# Patient Record
Sex: Female | Born: 1960 | Race: White | Hispanic: No | Marital: Married | Smoking: Former smoker
Health system: Southern US, Community
[De-identification: ages and names within clinical notes are randomized; demographics above are authoritative.]

## PROBLEM LIST (undated history)

## (undated) DIAGNOSIS — J45909 Unspecified asthma, uncomplicated: Secondary | ICD-10-CM

## (undated) DIAGNOSIS — E119 Type 2 diabetes mellitus without complications: Secondary | ICD-10-CM

## (undated) DIAGNOSIS — I509 Heart failure, unspecified: Secondary | ICD-10-CM

## (undated) DIAGNOSIS — I10 Essential (primary) hypertension: Secondary | ICD-10-CM

## (undated) DIAGNOSIS — G4730 Sleep apnea, unspecified: Secondary | ICD-10-CM

## (undated) DIAGNOSIS — J449 Chronic obstructive pulmonary disease, unspecified: Secondary | ICD-10-CM

## (undated) HISTORY — PX: ABDOMINAL HYSTERECTOMY: SHX81

## (undated) HISTORY — PX: COLON SURGERY: SHX602

---

## 2006-05-24 ENCOUNTER — Emergency Department: Payer: Self-pay | Admitting: Emergency Medicine

## 2006-12-20 ENCOUNTER — Emergency Department: Payer: Self-pay | Admitting: Emergency Medicine

## 2006-12-23 ENCOUNTER — Inpatient Hospital Stay: Payer: Self-pay | Admitting: Gastroenterology

## 2007-01-13 ENCOUNTER — Ambulatory Visit: Payer: Self-pay | Admitting: Gastroenterology

## 2007-01-17 ENCOUNTER — Ambulatory Visit: Payer: Self-pay | Admitting: Gastroenterology

## 2007-01-26 ENCOUNTER — Ambulatory Visit: Payer: Self-pay | Admitting: Surgery

## 2007-01-27 ENCOUNTER — Inpatient Hospital Stay: Payer: Self-pay | Admitting: Surgery

## 2007-07-04 ENCOUNTER — Ambulatory Visit: Payer: Self-pay | Admitting: Gastroenterology

## 2007-07-12 ENCOUNTER — Ambulatory Visit: Payer: Self-pay | Admitting: Gastroenterology

## 2007-07-15 ENCOUNTER — Emergency Department: Payer: Self-pay | Admitting: Emergency Medicine

## 2007-09-01 ENCOUNTER — Ambulatory Visit: Payer: Self-pay | Admitting: Family Medicine

## 2008-01-03 ENCOUNTER — Ambulatory Visit: Payer: Self-pay | Admitting: Surgery

## 2008-08-06 ENCOUNTER — Emergency Department: Payer: Self-pay | Admitting: Emergency Medicine

## 2009-01-30 ENCOUNTER — Emergency Department: Payer: Self-pay | Admitting: Unknown Physician Specialty

## 2010-07-06 ENCOUNTER — Emergency Department: Payer: Self-pay | Admitting: Emergency Medicine

## 2011-05-01 ENCOUNTER — Inpatient Hospital Stay: Payer: Self-pay | Admitting: Internal Medicine

## 2011-05-06 DIAGNOSIS — I059 Rheumatic mitral valve disease, unspecified: Secondary | ICD-10-CM

## 2012-01-14 ENCOUNTER — Ambulatory Visit: Payer: Self-pay | Admitting: Family Medicine

## 2012-02-01 ENCOUNTER — Ambulatory Visit: Payer: Self-pay | Admitting: Family Medicine

## 2012-12-01 ENCOUNTER — Ambulatory Visit: Payer: Self-pay | Admitting: Internal Medicine

## 2013-03-13 ENCOUNTER — Ambulatory Visit: Payer: Self-pay | Admitting: Internal Medicine

## 2013-06-10 DIAGNOSIS — J441 Chronic obstructive pulmonary disease with (acute) exacerbation: Secondary | ICD-10-CM | POA: Diagnosis not present

## 2013-07-05 DIAGNOSIS — J441 Chronic obstructive pulmonary disease with (acute) exacerbation: Secondary | ICD-10-CM | POA: Diagnosis not present

## 2013-07-23 ENCOUNTER — Inpatient Hospital Stay: Payer: Self-pay | Admitting: Internal Medicine

## 2013-07-23 DIAGNOSIS — Z6831 Body mass index (BMI) 31.0-31.9, adult: Secondary | ICD-10-CM | POA: Diagnosis not present

## 2013-07-23 DIAGNOSIS — Z23 Encounter for immunization: Secondary | ICD-10-CM | POA: Diagnosis not present

## 2013-07-23 DIAGNOSIS — J440 Chronic obstructive pulmonary disease with acute lower respiratory infection: Secondary | ICD-10-CM | POA: Diagnosis not present

## 2013-07-23 DIAGNOSIS — R6510 Systemic inflammatory response syndrome (SIRS) of non-infectious origin without acute organ dysfunction: Secondary | ICD-10-CM | POA: Diagnosis not present

## 2013-07-23 DIAGNOSIS — I10 Essential (primary) hypertension: Secondary | ICD-10-CM | POA: Diagnosis not present

## 2013-07-23 DIAGNOSIS — J9600 Acute respiratory failure, unspecified whether with hypoxia or hypercapnia: Secondary | ICD-10-CM | POA: Diagnosis not present

## 2013-07-23 DIAGNOSIS — J189 Pneumonia, unspecified organism: Secondary | ICD-10-CM | POA: Diagnosis not present

## 2013-07-23 DIAGNOSIS — Z87891 Personal history of nicotine dependence: Secondary | ICD-10-CM | POA: Diagnosis not present

## 2013-07-23 DIAGNOSIS — J9819 Other pulmonary collapse: Secondary | ICD-10-CM | POA: Diagnosis not present

## 2013-07-23 DIAGNOSIS — J449 Chronic obstructive pulmonary disease, unspecified: Secondary | ICD-10-CM | POA: Diagnosis not present

## 2013-07-23 DIAGNOSIS — R7309 Other abnormal glucose: Secondary | ICD-10-CM | POA: Diagnosis present

## 2013-07-23 DIAGNOSIS — J441 Chronic obstructive pulmonary disease with (acute) exacerbation: Secondary | ICD-10-CM | POA: Diagnosis not present

## 2013-07-23 DIAGNOSIS — R059 Cough, unspecified: Secondary | ICD-10-CM | POA: Diagnosis not present

## 2013-07-23 DIAGNOSIS — R05 Cough: Secondary | ICD-10-CM | POA: Diagnosis not present

## 2013-07-23 DIAGNOSIS — E669 Obesity, unspecified: Secondary | ICD-10-CM | POA: Diagnosis present

## 2013-07-23 DIAGNOSIS — R0789 Other chest pain: Secondary | ICD-10-CM | POA: Diagnosis not present

## 2013-07-23 DIAGNOSIS — A419 Sepsis, unspecified organism: Secondary | ICD-10-CM | POA: Diagnosis not present

## 2013-07-23 DIAGNOSIS — R0602 Shortness of breath: Secondary | ICD-10-CM | POA: Diagnosis not present

## 2013-07-23 DIAGNOSIS — J158 Pneumonia due to other specified bacteria: Secondary | ICD-10-CM | POA: Diagnosis not present

## 2013-07-23 LAB — BASIC METABOLIC PANEL
Anion Gap: 8 (ref 7–16)
BUN: 10 mg/dL (ref 7–18)
CREATININE: 0.9 mg/dL (ref 0.60–1.30)
Calcium, Total: 9.5 mg/dL (ref 8.5–10.1)
Chloride: 101 mmol/L (ref 98–107)
Co2: 24 mmol/L (ref 21–32)
EGFR (African American): >60
Glucose: 130 mg/dL — ABNORMAL HIGH (ref 65–99)
Osmolality: 267 (ref 275–301)
Potassium: 3.9 mmol/L (ref 3.5–5.1)
Sodium: 133 mmol/L — ABNORMAL LOW (ref 136–145)

## 2013-07-23 LAB — CBC
HCT: 43.1 % (ref 35.0–47.0)
HGB: 14.4 g/dL (ref 12.0–16.0)
MCH: 29.6 pg (ref 26.0–34.0)
MCHC: 33.4 g/dL (ref 32.0–36.0)
MCV: 89 fL (ref 80–100)
Platelet: 280 10*3/uL (ref 150–440)
RBC: 4.87 10*6/uL (ref 3.80–5.20)
RDW: 13.2 % (ref 11.5–14.5)
WBC: 17 10*3/uL — ABNORMAL HIGH (ref 3.6–11.0)

## 2013-07-23 LAB — TROPONIN I: Troponin-I: <0.02 ng/mL

## 2013-07-24 LAB — CBC WITH DIFFERENTIAL/PLATELET
Basophil #: 0.1 10*3/uL (ref 0.0–0.1)
Basophil %: 0.5 %
EOS ABS: 0 10*3/uL (ref 0.0–0.7)
Eosinophil %: 0 %
HCT: 38.7 % (ref 35.0–47.0)
HGB: 13.2 g/dL (ref 12.0–16.0)
Lymphocyte #: 1.1 10*3/uL (ref 1.0–3.6)
Lymphocyte %: 8.4 %
MCH: 30.6 pg (ref 26.0–34.0)
MCHC: 34 g/dL (ref 32.0–36.0)
MCV: 90 fL (ref 80–100)
MONO ABS: 0.2 x10 3/mm (ref 0.2–0.9)
MONOS PCT: 1.6 %
NEUTROS ABS: 11.5 10*3/uL — AB (ref 1.4–6.5)
Neutrophil %: 89.5 %
PLATELETS: 258 10*3/uL (ref 150–440)
RBC: 4.31 10*6/uL (ref 3.80–5.20)
RDW: 13.1 % (ref 11.5–14.5)
WBC: 12.9 10*3/uL — ABNORMAL HIGH (ref 3.6–11.0)

## 2013-07-24 LAB — BASIC METABOLIC PANEL
Anion Gap: 7 (ref 7–16)
BUN: 10 mg/dL (ref 7–18)
Calcium, Total: 9 mg/dL (ref 8.5–10.1)
Chloride: 105 mmol/L (ref 98–107)
Co2: 24 mmol/L (ref 21–32)
Creatinine: 1 mg/dL (ref 0.60–1.30)
EGFR (Non-African Amer.): >60
Glucose: 151 mg/dL — ABNORMAL HIGH (ref 65–99)
Osmolality: 274 (ref 275–301)
POTASSIUM: 4.4 mmol/L (ref 3.5–5.1)
SODIUM: 136 mmol/L (ref 136–145)

## 2013-07-25 LAB — CREATININE, SERUM
CREATININE: 1 mg/dL (ref 0.60–1.30)
EGFR (African American): >60
EGFR (Non-African Amer.): >60

## 2013-07-25 LAB — VANCOMYCIN, TROUGH: Vancomycin, Trough: 13 ug/mL (ref 10–20)

## 2013-07-26 LAB — EXPECTORATED SPUTUM ASSESSMENT W GRAM STAIN, RFLX TO RESP C

## 2013-07-28 LAB — CULTURE, BLOOD (SINGLE)

## 2013-07-28 LAB — PLATELET COUNT: Platelet: 288 10*3/uL (ref 150–440)

## 2013-08-04 DIAGNOSIS — J441 Chronic obstructive pulmonary disease with (acute) exacerbation: Secondary | ICD-10-CM | POA: Diagnosis not present

## 2013-08-04 DIAGNOSIS — R609 Edema, unspecified: Secondary | ICD-10-CM | POA: Diagnosis not present

## 2013-08-04 DIAGNOSIS — B370 Candidal stomatitis: Secondary | ICD-10-CM | POA: Diagnosis not present

## 2013-08-09 DIAGNOSIS — B370 Candidal stomatitis: Secondary | ICD-10-CM | POA: Diagnosis not present

## 2013-08-09 DIAGNOSIS — J441 Chronic obstructive pulmonary disease with (acute) exacerbation: Secondary | ICD-10-CM | POA: Diagnosis not present

## 2013-08-16 ENCOUNTER — Ambulatory Visit: Payer: Self-pay | Admitting: Internal Medicine

## 2013-08-16 DIAGNOSIS — J441 Chronic obstructive pulmonary disease with (acute) exacerbation: Secondary | ICD-10-CM | POA: Diagnosis not present

## 2013-08-16 DIAGNOSIS — J189 Pneumonia, unspecified organism: Secondary | ICD-10-CM | POA: Diagnosis not present

## 2013-08-16 DIAGNOSIS — J168 Pneumonia due to other specified infectious organisms: Secondary | ICD-10-CM | POA: Diagnosis not present

## 2013-08-16 DIAGNOSIS — R0602 Shortness of breath: Secondary | ICD-10-CM | POA: Diagnosis not present

## 2013-08-16 DIAGNOSIS — R062 Wheezing: Secondary | ICD-10-CM | POA: Diagnosis not present

## 2013-08-25 DIAGNOSIS — J168 Pneumonia due to other specified infectious organisms: Secondary | ICD-10-CM | POA: Diagnosis not present

## 2013-08-25 DIAGNOSIS — M545 Low back pain, unspecified: Secondary | ICD-10-CM | POA: Diagnosis not present

## 2013-08-25 DIAGNOSIS — R0602 Shortness of breath: Secondary | ICD-10-CM | POA: Diagnosis not present

## 2013-08-25 DIAGNOSIS — J449 Chronic obstructive pulmonary disease, unspecified: Secondary | ICD-10-CM | POA: Diagnosis not present

## 2013-08-29 DIAGNOSIS — M549 Dorsalgia, unspecified: Secondary | ICD-10-CM | POA: Diagnosis not present

## 2013-08-31 DIAGNOSIS — M62838 Other muscle spasm: Secondary | ICD-10-CM | POA: Diagnosis not present

## 2013-09-01 DIAGNOSIS — M549 Dorsalgia, unspecified: Secondary | ICD-10-CM | POA: Diagnosis not present

## 2013-09-05 DIAGNOSIS — M549 Dorsalgia, unspecified: Secondary | ICD-10-CM | POA: Diagnosis not present

## 2013-09-06 DIAGNOSIS — M549 Dorsalgia, unspecified: Secondary | ICD-10-CM | POA: Diagnosis not present

## 2013-09-19 DIAGNOSIS — J441 Chronic obstructive pulmonary disease with (acute) exacerbation: Secondary | ICD-10-CM | POA: Diagnosis not present

## 2013-09-19 DIAGNOSIS — J449 Chronic obstructive pulmonary disease, unspecified: Secondary | ICD-10-CM | POA: Diagnosis not present

## 2013-09-19 DIAGNOSIS — G4710 Hypersomnia, unspecified: Secondary | ICD-10-CM | POA: Diagnosis not present

## 2013-09-19 DIAGNOSIS — G4720 Circadian rhythm sleep disorder, unspecified type: Secondary | ICD-10-CM | POA: Diagnosis not present

## 2013-09-19 DIAGNOSIS — G4730 Sleep apnea, unspecified: Secondary | ICD-10-CM | POA: Diagnosis not present

## 2013-10-20 DIAGNOSIS — J019 Acute sinusitis, unspecified: Secondary | ICD-10-CM | POA: Diagnosis not present

## 2013-10-20 DIAGNOSIS — J440 Chronic obstructive pulmonary disease with acute lower respiratory infection: Secondary | ICD-10-CM | POA: Diagnosis not present

## 2013-12-10 ENCOUNTER — Emergency Department: Payer: Self-pay | Admitting: Emergency Medicine

## 2013-12-10 DIAGNOSIS — R52 Pain, unspecified: Secondary | ICD-10-CM | POA: Diagnosis not present

## 2013-12-10 DIAGNOSIS — K7689 Other specified diseases of liver: Secondary | ICD-10-CM | POA: Diagnosis not present

## 2013-12-10 DIAGNOSIS — Z9079 Acquired absence of other genital organ(s): Secondary | ICD-10-CM | POA: Diagnosis not present

## 2013-12-10 DIAGNOSIS — D3500 Benign neoplasm of unspecified adrenal gland: Secondary | ICD-10-CM | POA: Diagnosis not present

## 2013-12-10 DIAGNOSIS — Z79899 Other long term (current) drug therapy: Secondary | ICD-10-CM | POA: Diagnosis not present

## 2013-12-10 DIAGNOSIS — J449 Chronic obstructive pulmonary disease, unspecified: Secondary | ICD-10-CM | POA: Diagnosis not present

## 2013-12-10 DIAGNOSIS — R109 Unspecified abdominal pain: Secondary | ICD-10-CM | POA: Diagnosis not present

## 2013-12-10 LAB — URINALYSIS, COMPLETE
Bilirubin,UR: NEGATIVE
Glucose,UR: NEGATIVE mg/dL (ref 0–75)
KETONE: NEGATIVE
Leukocyte Esterase: NEGATIVE
Nitrite: NEGATIVE
PROTEIN: NEGATIVE
Ph: 5 (ref 4.5–8.0)
SPECIFIC GRAVITY: 1.011 (ref 1.003–1.030)

## 2013-12-10 LAB — CBC WITH DIFFERENTIAL/PLATELET
BASOS ABS: 0.1 10*3/uL (ref 0.0–0.1)
Basophil %: 1.2 %
Eosinophil #: 0.5 10*3/uL (ref 0.0–0.7)
Eosinophil %: 3.9 %
HCT: 45.3 % (ref 35.0–47.0)
HGB: 15.2 g/dL (ref 12.0–16.0)
Lymphocyte #: 4.1 10*3/uL — ABNORMAL HIGH (ref 1.0–3.6)
Lymphocyte %: 33.3 %
MCH: 29.7 pg (ref 26.0–34.0)
MCHC: 33.5 g/dL (ref 32.0–36.0)
MCV: 89 fL (ref 80–100)
MONO ABS: 0.9 x10 3/mm (ref 0.2–0.9)
Monocyte %: 7.4 %
NEUTROS ABS: 6.6 10*3/uL — AB (ref 1.4–6.5)
Neutrophil %: 54.2 %
PLATELETS: 307 10*3/uL (ref 150–440)
RBC: 5.11 10*6/uL (ref 3.80–5.20)
RDW: 13.1 % (ref 11.5–14.5)
WBC: 12.3 10*3/uL — ABNORMAL HIGH (ref 3.6–11.0)

## 2013-12-10 LAB — COMPREHENSIVE METABOLIC PANEL
ALBUMIN: 4 g/dL (ref 3.4–5.0)
AST: 24 U/L (ref 15–37)
Alkaline Phosphatase: 74 U/L
Anion Gap: 7 (ref 7–16)
BUN: 14 mg/dL (ref 7–18)
Bilirubin,Total: 0.3 mg/dL (ref 0.2–1.0)
CO2: 28 mmol/L (ref 21–32)
CREATININE: 0.86 mg/dL (ref 0.60–1.30)
Calcium, Total: 9.6 mg/dL (ref 8.5–10.1)
Chloride: 102 mmol/L (ref 98–107)
EGFR (African American): >60
EGFR (Non-African Amer.): >60
Glucose: 96 mg/dL (ref 65–99)
OSMOLALITY: 274 (ref 275–301)
POTASSIUM: 4.3 mmol/L (ref 3.5–5.1)
SGPT (ALT): 28 U/L (ref 12–78)
Sodium: 137 mmol/L (ref 136–145)
Total Protein: 8.1 g/dL (ref 6.4–8.2)

## 2013-12-12 DIAGNOSIS — K5732 Diverticulitis of large intestine without perforation or abscess without bleeding: Secondary | ICD-10-CM | POA: Diagnosis not present

## 2014-01-02 DIAGNOSIS — R0602 Shortness of breath: Secondary | ICD-10-CM | POA: Diagnosis not present

## 2014-01-02 DIAGNOSIS — R05 Cough: Secondary | ICD-10-CM | POA: Diagnosis not present

## 2014-01-02 DIAGNOSIS — J441 Chronic obstructive pulmonary disease with (acute) exacerbation: Secondary | ICD-10-CM | POA: Diagnosis not present

## 2014-01-02 DIAGNOSIS — R059 Cough, unspecified: Secondary | ICD-10-CM | POA: Diagnosis not present

## 2014-01-04 DIAGNOSIS — R109 Unspecified abdominal pain: Secondary | ICD-10-CM | POA: Insufficient documentation

## 2014-01-04 DIAGNOSIS — R1032 Left lower quadrant pain: Secondary | ICD-10-CM | POA: Diagnosis not present

## 2014-01-04 DIAGNOSIS — G8929 Other chronic pain: Secondary | ICD-10-CM | POA: Diagnosis not present

## 2014-01-04 DIAGNOSIS — K3189 Other diseases of stomach and duodenum: Secondary | ICD-10-CM | POA: Diagnosis not present

## 2014-01-04 DIAGNOSIS — R1013 Epigastric pain: Secondary | ICD-10-CM | POA: Diagnosis not present

## 2014-01-04 DIAGNOSIS — R1012 Left upper quadrant pain: Secondary | ICD-10-CM | POA: Diagnosis not present

## 2014-02-05 DIAGNOSIS — R0602 Shortness of breath: Secondary | ICD-10-CM | POA: Diagnosis not present

## 2014-02-05 DIAGNOSIS — J441 Chronic obstructive pulmonary disease with (acute) exacerbation: Secondary | ICD-10-CM | POA: Diagnosis not present

## 2014-02-06 ENCOUNTER — Ambulatory Visit: Payer: Self-pay | Admitting: Internal Medicine

## 2014-02-06 DIAGNOSIS — R059 Cough, unspecified: Secondary | ICD-10-CM | POA: Diagnosis not present

## 2014-02-06 DIAGNOSIS — J449 Chronic obstructive pulmonary disease, unspecified: Secondary | ICD-10-CM | POA: Diagnosis not present

## 2014-02-06 DIAGNOSIS — R0602 Shortness of breath: Secondary | ICD-10-CM | POA: Diagnosis not present

## 2014-02-08 DIAGNOSIS — R0602 Shortness of breath: Secondary | ICD-10-CM | POA: Diagnosis not present

## 2014-02-08 DIAGNOSIS — J440 Chronic obstructive pulmonary disease with acute lower respiratory infection: Secondary | ICD-10-CM | POA: Diagnosis not present

## 2014-04-30 ENCOUNTER — Ambulatory Visit: Payer: Self-pay | Admitting: Internal Medicine

## 2014-04-30 DIAGNOSIS — J9611 Chronic respiratory failure with hypoxia: Secondary | ICD-10-CM | POA: Diagnosis not present

## 2014-04-30 DIAGNOSIS — J9809 Other diseases of bronchus, not elsewhere classified: Secondary | ICD-10-CM | POA: Diagnosis not present

## 2014-04-30 DIAGNOSIS — R0602 Shortness of breath: Secondary | ICD-10-CM | POA: Diagnosis not present

## 2014-04-30 DIAGNOSIS — R05 Cough: Secondary | ICD-10-CM | POA: Diagnosis not present

## 2014-04-30 DIAGNOSIS — J441 Chronic obstructive pulmonary disease with (acute) exacerbation: Secondary | ICD-10-CM | POA: Diagnosis not present

## 2014-05-08 DIAGNOSIS — J9611 Chronic respiratory failure with hypoxia: Secondary | ICD-10-CM | POA: Diagnosis not present

## 2014-05-08 DIAGNOSIS — J441 Chronic obstructive pulmonary disease with (acute) exacerbation: Secondary | ICD-10-CM | POA: Diagnosis not present

## 2014-05-08 DIAGNOSIS — J440 Chronic obstructive pulmonary disease with acute lower respiratory infection: Secondary | ICD-10-CM | POA: Diagnosis not present

## 2014-05-08 DIAGNOSIS — G4733 Obstructive sleep apnea (adult) (pediatric): Secondary | ICD-10-CM | POA: Diagnosis not present

## 2014-05-08 DIAGNOSIS — J449 Chronic obstructive pulmonary disease, unspecified: Secondary | ICD-10-CM | POA: Diagnosis not present

## 2014-06-12 DIAGNOSIS — F411 Generalized anxiety disorder: Secondary | ICD-10-CM | POA: Diagnosis not present

## 2014-06-12 DIAGNOSIS — Z1389 Encounter for screening for other disorder: Secondary | ICD-10-CM | POA: Diagnosis not present

## 2014-06-12 DIAGNOSIS — J441 Chronic obstructive pulmonary disease with (acute) exacerbation: Secondary | ICD-10-CM | POA: Diagnosis not present

## 2014-08-20 DIAGNOSIS — J441 Chronic obstructive pulmonary disease with (acute) exacerbation: Secondary | ICD-10-CM | POA: Diagnosis not present

## 2014-08-20 DIAGNOSIS — J9611 Chronic respiratory failure with hypoxia: Secondary | ICD-10-CM | POA: Diagnosis not present

## 2014-08-21 ENCOUNTER — Ambulatory Visit: Payer: Self-pay | Admitting: Internal Medicine

## 2014-08-21 DIAGNOSIS — R05 Cough: Secondary | ICD-10-CM | POA: Diagnosis not present

## 2014-08-21 DIAGNOSIS — J449 Chronic obstructive pulmonary disease, unspecified: Secondary | ICD-10-CM | POA: Diagnosis not present

## 2014-08-23 DIAGNOSIS — J449 Chronic obstructive pulmonary disease, unspecified: Secondary | ICD-10-CM | POA: Diagnosis not present

## 2014-08-23 DIAGNOSIS — J069 Acute upper respiratory infection, unspecified: Secondary | ICD-10-CM | POA: Diagnosis not present

## 2014-08-23 DIAGNOSIS — J9611 Chronic respiratory failure with hypoxia: Secondary | ICD-10-CM | POA: Diagnosis not present

## 2014-08-23 DIAGNOSIS — G4733 Obstructive sleep apnea (adult) (pediatric): Secondary | ICD-10-CM | POA: Diagnosis not present

## 2014-08-29 DIAGNOSIS — R0602 Shortness of breath: Secondary | ICD-10-CM | POA: Diagnosis not present

## 2014-09-29 NOTE — H&P (Signed)
PATIENT NAME:  Shelly [name], Shelly [name] MR#:  [id number] DATE OF BIRTH:  03/26/1961  DATE OF ADMISSION:  07/23/2013  PRIMARY CARE PHYSICIAN: [place]  REFERRING PHYSICIAN: Ponciano Ort, MD  CHIEF COMPLAINT: Shortness of breath.   HISTORY OF PRESENT ILLNESS: The patient is a pleasant 54 year old COPDer on nocturnal oxygen, who comes in for shortness of breath. Of note, her husband is in the next room in the ER with similar symptoms. They both started to have a cough which was productive, shortness of breath, wheezing. The symptoms started about a month ago. Of note, the patient had gone to a physician's office and initially was placed on 5 days of Levaquin with a prednisone taper. The patient did feel somewhat better but the symptoms recurred and then the second time around she was started on a Z-Pak with steroid taper over a week. She finished her steroids about a couple of weeks ago she states, however, her symptoms are  not better and recently are more progressive. The patient has had fevers, chills, but she has not measured any temperature. The patient has a productive cough with greenish sputum. Here, she has leukocytosis of about 17,000 and x-ray showing ill-defined opacity in the lingula. Hospitalist services were contacted for further evaluation and management.   PAST MEDICAL HISTORY: History of diverticulitis, status post partial small bowel resection,  hypertension, COPD, on nocturnal oxygen.   ALLERGIES: No known drug allergies.   SOCIAL HISTORY: Ex-smoker, quit couple of years ago but was a 30 year-plus smoker, occasional alcohol. No drug use.   FAMILY HISTORY: Adopted.   OUTPATIENT MEDICATIONS: Advair 250/50 mcg 1 puff 2 times a day as needed, DuoNebs 1 vial 4 times a day as needed for shortness of breath, lisinopril 20 mg daily, moisture drops 1 drop to each affected eye once a day, Ventolin p.r.n.   REVIEW OF SYSTEMS: CONSTITUTIONAL: Positive for subjective  fevers, chills, increased weight gain in the last couple years.  EYES: No blurry vision or double vision.  ENT: No tinnitus or hearing loss.  Positive for some sinus drainage, off and on sore throat.  CARDIOVASCULAR: No chest pain, swelling in the legs or arrhythmia.  RESPIRATORY: Positive for productive cough with greenish sputum, positive for wheezing, shortness of breath and history of COPD.   GASTROINTESTINAL: No nausea, vomiting, diarrhea, abdominal pain, bloody stools or dark stools.  GENITOURINARY: Denies dysuria, hematuria.  HEMATOLOGIC AND LYMPHATIC: No anemia or easy bruising.  SKIN: No rashes.  MUSCULOSKELETAL: Denies arthritis.  NEUROLOGIC: No focal weakness or numbness. Has global weakness.  PSYCHIATRIC:  Has increased anxiety recently because of her breathing issues.   PHYSICAL EXAMINATION: VITAL SIGNS: Temperature on arrival 97.8, pulse rate 117, respiratory rate 26, blood pressure 129/72, otherwise sat 89% on room air.  GENERAL: The patient is a well-developed female sitting in bed.  HEENT: Normocephalic, atraumatic. Pupils are equal and reactive. Moist mucous membranes.  NECK: Supple. No thyroid tenderness. No cervical lymphadenopathy.  CARDIOVASCULAR: S1, S2, tachycardic. No significant murmurs appreciated.  LUNGS: Bilateral diffuse wheezing, more on the right than the left with some crackles mid and lower lobe on the left.  ABDOMEN: Soft, nontender, nondistended. Positive bowel sounds in all quadrants.  EXTREMITIES: No pitting edema.  NEUROLOGICAL:  Cranial nerves II through XII grossly intact. Strength is 5 out of 5 in all extremities. Sensation is intact to light touch.  PSYCHIATRIC: Awake, alert, oriented x 3.   LABORATORY, DIAGNOSTIC AND RADIOLOGIC DATA: X-ray as above. Glucose  130, BUN 10, creatinine 0.9, sodium 133, potassium 3.9, chloride 101. Troponin negative. White count 17, hemoglobin 14.4, platelets are 280. EKG shows sinus tach, rate of 106.   ASSESSMENT AND  PLAN: We have a 54 year old female with chronic obstructive pulmonary disease, diverticulitis history presents with subacute shortness of breath, cough, fevers, who has had 2 rounds of antibiotics with Levaquin initially then Z-Pak, 2 rounds of steroids, who still has productive cough, shortness of breath and comes in with systemic inflammatory response syndrome criteria, tachycardia, leukocytosis and tachypnea. X-ray shows mild ill-defined opacity at the lingula. For better imaging, I would obtain a CAT scan with contrast as she has had this illness for some time and has had 2 rounds of antibiotics without significant improvement. Also we would panculture including sputum cultures, blood cultures, urine, strep and Legionella antigens. Furthermore, we would start her on broad-spectrum antibiotics with vancomycin and Zosyn. She has received a dose of Levaquin today. Would follow the culture, see what the CAT scan shows. She appears to have at a minimum acute chronic obstructive pulmonary disease exacerbation with bronchitis and we will see what the CAT scan shows to see if she has infectious/pneumonia at the lingula. Would start her on oxygen, nebulizers while she is awake, IV steroids, follow with the cultures. I would continue her lisinopril, start her on some heparin for deep vein thrombosis prophylaxis, some Robitussin AC for cough.   CODE STATUS: The patient is full code.   TOTAL TIME SPENT: 50 minutes.     ____________________________ Vivien Presto, MD sa:cs D: 07/23/2013 15:20:51 ET T: 07/23/2013 15:38:03 ET JOB#: [id number]  cc: Vivien Presto, MD, <Dictator> Vivien Presto MD ELECTRONICALLY SIGNED 08/11/2013 13:09

## 2014-09-29 NOTE — Discharge Summary (Signed)
PATIENT NAME:  Shelly [name], Shelly [name] MR#:  [id number] DATE OF BIRTH:  1960-07-30  DATE OF ADMISSION:  07/23/2013 DATE OF DISCHARGE:  07/28/2013  PRIMARY CARE PROVIDER/PULMONOLOGIST: Dr. Devona Konig.   DISCHARGE DIAGNOSES:  1.  Left lower lobe pneumonia due to enterococcus.  2.  Sepsis present on admission.  3.  Acute respiratory failure.  4.  Acute chronic obstructive pulmonary disease exacerbation.  5.  Hypertension.  6.  Obesity.  7.  Hyperglycemia secondary to steroids.   IMAGING STUDIES DONE: Includes a chest x-ray and CT scan of the chest, which showed possible left lower lobe infiltrate.   CONSULTANTS: Dr. Raul Del of pulmonary.   ADMITTING HISTORY AND PHYSICAL AND HOSPITAL COURSE: Please see detailed H and P dictated by Dr. Bridgette Habermann previously. In brief, a 54 year old Caucasian female patient with history of  COPD on nighttime home oxygen and hypertension, who presented to be hospital complaining of shortness of breath. The patient was treated with 2 rounds of antibiotics along with steroids as outpatient over the past month and improved, but started deteriorating again. The patient was seen in the Emergency Room and was thought to have left lower lobe pneumonia with COPD exacerbation and sepsis and admitted to the hospitalist service.   HOSPITAL COURSE:  1.  Acute respiratory failure secondary to left lower lobe enterococcus pneumonia and COPD exacerbation with sepsis. The patient was treated with IV fluids, IV antibiotics, started on IV steroids, nebulizers and oxygen support. The patient was followed by respiratory therapy. Pulmonary, Dr. Raul Del was consulted. The patient improved slowly and by the day of discharge the patient is ambulating well. She is still needing 2 L of humidified oxygen. The patient feels much better than admission, but still will need slow prednisone taper and antibiotics. The patient will be discharged home on Augmentin as her sputum has grown enterococcus. The  patient's sepsis has resolved. The patient feels significantly better. Today on examination, she still has mild wheezing. No accessory muscle use.  2.  Her hypertension was well controlled during the hospital stay.  3.  She had mild hyperglycemia secondary to IV steroids, nothing significant, highest was 156.   DISCHARGE MEDICATIONS:  1.  Advair Diskus 250/51 puff inhaled 2 times a day as needed for shortness of breath.  2.  DuoNeb 3 mL every 4 hours as needed for wheezing or shortness of breath.  3.  Lisinopril 20 mg daily.  4.  Moisture drops 1 drop to each affected eye once a day at bedtime.  5.  Ventolin HFA 2 puffs inhaled every 4 hours as needed.  6.  Chlorpheniramine/hydrocodone 8 mg/10 mg 5 mL oral every 12 hours as needed for cough.  7.  Acetaminophen/hydrocodone 325/10,  1 tablet oral 3 times a day as needed for pain.  8.  Augmentin 875/125 mg oral 2 times a day for 7 days.  9.  Prednisone 60 mg tapered x 5 mg a day over 12 days.   DISCHARGE INSTRUCTIONS: Home oxygen humidified 2 L continuous. Low-salt diet. Activity as tolerated. Follow up with primary care physician and Dr. Humphrey Rolls of pulmonary in 1 to 2 weeks.   TIME SPENT: On day of discharge in discharge activity was 40 minutes.  ____________________________ [name], MD srs:aw D: 07/30/2013 21:55:42 ET T: 07/31/2013 06:51:54 ET JOB#: [id number]  cc: Alveta Heimlich R. Darvin Neighbours, MD, <Dictator> [name], MD Neita Carp MD ELECTRONICALLY SIGNED 08/10/2013 18:41

## 2014-12-14 DIAGNOSIS — J441 Chronic obstructive pulmonary disease with (acute) exacerbation: Secondary | ICD-10-CM | POA: Diagnosis not present

## 2014-12-14 DIAGNOSIS — R0602 Shortness of breath: Secondary | ICD-10-CM | POA: Diagnosis not present

## 2014-12-14 DIAGNOSIS — J9611 Chronic respiratory failure with hypoxia: Secondary | ICD-10-CM | POA: Diagnosis not present

## 2014-12-20 DIAGNOSIS — J9611 Chronic respiratory failure with hypoxia: Secondary | ICD-10-CM | POA: Diagnosis not present

## 2014-12-20 DIAGNOSIS — G4733 Obstructive sleep apnea (adult) (pediatric): Secondary | ICD-10-CM | POA: Diagnosis not present

## 2014-12-20 DIAGNOSIS — J441 Chronic obstructive pulmonary disease with (acute) exacerbation: Secondary | ICD-10-CM | POA: Diagnosis not present

## 2014-12-20 DIAGNOSIS — R0602 Shortness of breath: Secondary | ICD-10-CM | POA: Diagnosis not present

## 2014-12-21 ENCOUNTER — Ambulatory Visit
Admission: RE | Admit: 2014-12-21 | Discharge: 2014-12-21 | Disposition: A | Discharge status: Home / Self Care | Payer: Medicare Other | Source: Ambulatory Visit | Attending: Internal Medicine | Admitting: Internal Medicine

## 2014-12-21 ENCOUNTER — Other Ambulatory Visit: Payer: Self-pay | Admitting: Internal Medicine

## 2014-12-21 DIAGNOSIS — R05 Cough: Secondary | ICD-10-CM

## 2014-12-21 DIAGNOSIS — R0602 Shortness of breath: Secondary | ICD-10-CM | POA: Diagnosis not present

## 2014-12-21 DIAGNOSIS — R059 Cough, unspecified: Secondary | ICD-10-CM

## 2014-12-27 DIAGNOSIS — B379 Candidiasis, unspecified: Secondary | ICD-10-CM | POA: Diagnosis not present

## 2014-12-27 DIAGNOSIS — J441 Chronic obstructive pulmonary disease with (acute) exacerbation: Secondary | ICD-10-CM | POA: Diagnosis not present

## 2014-12-27 DIAGNOSIS — R0602 Shortness of breath: Secondary | ICD-10-CM | POA: Diagnosis not present

## 2015-01-03 DIAGNOSIS — R0602 Shortness of breath: Secondary | ICD-10-CM | POA: Diagnosis not present

## 2015-01-11 DIAGNOSIS — J0191 Acute recurrent sinusitis, unspecified: Secondary | ICD-10-CM | POA: Diagnosis not present

## 2015-01-11 DIAGNOSIS — I270 Primary pulmonary hypertension: Secondary | ICD-10-CM | POA: Diagnosis not present

## 2015-01-11 DIAGNOSIS — J449 Chronic obstructive pulmonary disease, unspecified: Secondary | ICD-10-CM | POA: Diagnosis not present

## 2015-02-05 DIAGNOSIS — J440 Chronic obstructive pulmonary disease with acute lower respiratory infection: Secondary | ICD-10-CM | POA: Diagnosis not present

## 2015-02-05 DIAGNOSIS — J9611 Chronic respiratory failure with hypoxia: Secondary | ICD-10-CM | POA: Diagnosis not present

## 2015-02-05 DIAGNOSIS — R05 Cough: Secondary | ICD-10-CM | POA: Diagnosis not present

## 2015-02-21 ENCOUNTER — Ambulatory Visit
Admission: RE | Admit: 2015-02-21 | Discharge: 2015-02-21 | Disposition: A | Discharge status: Home / Self Care | Payer: Medicare Other | Source: Ambulatory Visit | Attending: Internal Medicine | Admitting: Internal Medicine

## 2015-02-21 ENCOUNTER — Other Ambulatory Visit: Payer: Self-pay | Admitting: Internal Medicine

## 2015-02-21 DIAGNOSIS — J441 Chronic obstructive pulmonary disease with (acute) exacerbation: Secondary | ICD-10-CM | POA: Diagnosis not present

## 2015-02-21 DIAGNOSIS — R05 Cough: Secondary | ICD-10-CM | POA: Insufficient documentation

## 2015-02-21 DIAGNOSIS — R059 Cough, unspecified: Secondary | ICD-10-CM

## 2015-02-21 DIAGNOSIS — J9611 Chronic respiratory failure with hypoxia: Secondary | ICD-10-CM | POA: Diagnosis not present

## 2015-02-22 DIAGNOSIS — R05 Cough: Secondary | ICD-10-CM | POA: Diagnosis not present

## 2015-02-22 DIAGNOSIS — J441 Chronic obstructive pulmonary disease with (acute) exacerbation: Secondary | ICD-10-CM | POA: Diagnosis not present

## 2015-02-26 ENCOUNTER — Other Ambulatory Visit: Payer: Self-pay | Admitting: Physician Assistant

## 2015-02-26 DIAGNOSIS — R9389 Abnormal findings on diagnostic imaging of other specified body structures: Secondary | ICD-10-CM

## 2015-02-28 ENCOUNTER — Ambulatory Visit
Admission: RE | Admit: 2015-02-28 | Discharge: 2015-02-28 | Disposition: A | Discharge status: Home / Self Care | Payer: Medicare Other | Source: Ambulatory Visit | Attending: Physician Assistant | Admitting: Physician Assistant

## 2015-02-28 DIAGNOSIS — R9389 Abnormal findings on diagnostic imaging of other specified body structures: Secondary | ICD-10-CM

## 2015-02-28 DIAGNOSIS — R918 Other nonspecific abnormal finding of lung field: Secondary | ICD-10-CM | POA: Diagnosis not present

## 2015-02-28 DIAGNOSIS — D3502 Benign neoplasm of left adrenal gland: Secondary | ICD-10-CM | POA: Insufficient documentation

## 2015-02-28 DIAGNOSIS — D3501 Benign neoplasm of right adrenal gland: Secondary | ICD-10-CM | POA: Insufficient documentation

## 2015-02-28 DIAGNOSIS — J189 Pneumonia, unspecified organism: Secondary | ICD-10-CM | POA: Diagnosis not present

## 2015-02-28 HISTORY — DX: Essential (primary) hypertension: I10

## 2015-02-28 HISTORY — DX: Unspecified asthma, uncomplicated: J45.909

## 2015-02-28 MED ORDER — IOHEXOL 300 MG/ML  SOLN
75.0000 mL | Freq: Once | INTRAMUSCULAR | Status: AC | PRN
  Administered 2015-02-28: 75 mL via INTRAVENOUS

## 2015-03-04 DIAGNOSIS — J158 Pneumonia due to other specified bacteria: Secondary | ICD-10-CM | POA: Diagnosis not present

## 2015-03-04 DIAGNOSIS — J449 Chronic obstructive pulmonary disease, unspecified: Secondary | ICD-10-CM | POA: Diagnosis not present

## 2015-03-04 DIAGNOSIS — J9611 Chronic respiratory failure with hypoxia: Secondary | ICD-10-CM | POA: Diagnosis not present

## 2015-03-25 DIAGNOSIS — J441 Chronic obstructive pulmonary disease with (acute) exacerbation: Secondary | ICD-10-CM | POA: Diagnosis not present

## 2015-03-28 DIAGNOSIS — J441 Chronic obstructive pulmonary disease with (acute) exacerbation: Secondary | ICD-10-CM | POA: Diagnosis not present

## 2015-04-01 DIAGNOSIS — B379 Candidiasis, unspecified: Secondary | ICD-10-CM | POA: Diagnosis not present

## 2015-04-01 DIAGNOSIS — J9611 Chronic respiratory failure with hypoxia: Secondary | ICD-10-CM | POA: Diagnosis not present

## 2015-04-01 DIAGNOSIS — G4733 Obstructive sleep apnea (adult) (pediatric): Secondary | ICD-10-CM | POA: Diagnosis not present

## 2015-04-01 DIAGNOSIS — J158 Pneumonia due to other specified bacteria: Secondary | ICD-10-CM | POA: Diagnosis not present

## 2015-04-01 DIAGNOSIS — J449 Chronic obstructive pulmonary disease, unspecified: Secondary | ICD-10-CM | POA: Diagnosis not present

## 2015-04-15 DIAGNOSIS — J9611 Chronic respiratory failure with hypoxia: Secondary | ICD-10-CM | POA: Diagnosis not present

## 2015-04-15 DIAGNOSIS — G4733 Obstructive sleep apnea (adult) (pediatric): Secondary | ICD-10-CM | POA: Diagnosis not present

## 2015-04-15 DIAGNOSIS — J449 Chronic obstructive pulmonary disease, unspecified: Secondary | ICD-10-CM | POA: Diagnosis not present

## 2015-04-25 DIAGNOSIS — J449 Chronic obstructive pulmonary disease, unspecified: Secondary | ICD-10-CM | POA: Diagnosis not present

## 2015-05-08 DIAGNOSIS — J441 Chronic obstructive pulmonary disease with (acute) exacerbation: Secondary | ICD-10-CM | POA: Diagnosis not present

## 2015-05-08 DIAGNOSIS — R062 Wheezing: Secondary | ICD-10-CM | POA: Diagnosis not present

## 2015-05-08 DIAGNOSIS — G4733 Obstructive sleep apnea (adult) (pediatric): Secondary | ICD-10-CM | POA: Diagnosis not present

## 2015-05-14 DIAGNOSIS — G4733 Obstructive sleep apnea (adult) (pediatric): Secondary | ICD-10-CM | POA: Diagnosis not present

## 2015-05-27 DIAGNOSIS — G4701 Insomnia due to medical condition: Secondary | ICD-10-CM | POA: Diagnosis not present

## 2015-05-27 DIAGNOSIS — G4733 Obstructive sleep apnea (adult) (pediatric): Secondary | ICD-10-CM | POA: Diagnosis not present

## 2015-07-17 DIAGNOSIS — G4701 Insomnia due to medical condition: Secondary | ICD-10-CM | POA: Diagnosis not present

## 2015-07-17 DIAGNOSIS — J441 Chronic obstructive pulmonary disease with (acute) exacerbation: Secondary | ICD-10-CM | POA: Diagnosis not present

## 2015-07-17 DIAGNOSIS — G4733 Obstructive sleep apnea (adult) (pediatric): Secondary | ICD-10-CM | POA: Diagnosis not present

## 2015-08-14 DIAGNOSIS — R0602 Shortness of breath: Secondary | ICD-10-CM | POA: Diagnosis not present

## 2015-09-05 DIAGNOSIS — G4733 Obstructive sleep apnea (adult) (pediatric): Secondary | ICD-10-CM | POA: Diagnosis not present

## 2015-09-05 DIAGNOSIS — G4701 Insomnia due to medical condition: Secondary | ICD-10-CM | POA: Diagnosis not present

## 2015-09-05 DIAGNOSIS — J441 Chronic obstructive pulmonary disease with (acute) exacerbation: Secondary | ICD-10-CM | POA: Diagnosis not present

## 2015-09-05 DIAGNOSIS — B379 Candidiasis, unspecified: Secondary | ICD-10-CM | POA: Diagnosis not present

## 2015-09-05 DIAGNOSIS — J9611 Chronic respiratory failure with hypoxia: Secondary | ICD-10-CM | POA: Diagnosis not present

## 2015-10-21 DIAGNOSIS — J0191 Acute recurrent sinusitis, unspecified: Secondary | ICD-10-CM | POA: Diagnosis not present

## 2015-10-21 DIAGNOSIS — R05 Cough: Secondary | ICD-10-CM | POA: Diagnosis not present

## 2015-10-21 DIAGNOSIS — J9611 Chronic respiratory failure with hypoxia: Secondary | ICD-10-CM | POA: Diagnosis not present

## 2015-10-21 DIAGNOSIS — J449 Chronic obstructive pulmonary disease, unspecified: Secondary | ICD-10-CM | POA: Diagnosis not present

## 2015-10-22 ENCOUNTER — Other Ambulatory Visit: Payer: Self-pay | Admitting: Internal Medicine

## 2015-10-22 ENCOUNTER — Ambulatory Visit
Admission: RE | Admit: 2015-10-22 | Discharge: 2015-10-22 | Disposition: A | Discharge status: Home / Self Care | Payer: Medicare Other | Source: Ambulatory Visit | Attending: Internal Medicine | Admitting: Internal Medicine

## 2015-10-22 DIAGNOSIS — R05 Cough: Secondary | ICD-10-CM

## 2015-10-22 DIAGNOSIS — R918 Other nonspecific abnormal finding of lung field: Secondary | ICD-10-CM | POA: Diagnosis not present

## 2015-10-22 DIAGNOSIS — R059 Cough, unspecified: Secondary | ICD-10-CM

## 2015-10-22 DIAGNOSIS — R0602 Shortness of breath: Secondary | ICD-10-CM | POA: Diagnosis not present

## 2015-11-05 DIAGNOSIS — J449 Chronic obstructive pulmonary disease, unspecified: Secondary | ICD-10-CM | POA: Diagnosis not present

## 2015-11-05 DIAGNOSIS — J9611 Chronic respiratory failure with hypoxia: Secondary | ICD-10-CM | POA: Diagnosis not present

## 2015-11-05 DIAGNOSIS — G4733 Obstructive sleep apnea (adult) (pediatric): Secondary | ICD-10-CM | POA: Diagnosis not present

## 2015-11-05 DIAGNOSIS — J0191 Acute recurrent sinusitis, unspecified: Secondary | ICD-10-CM | POA: Diagnosis not present

## 2015-12-02 ENCOUNTER — Other Ambulatory Visit: Payer: Self-pay | Admitting: Primary Care

## 2015-12-02 DIAGNOSIS — Z1231 Encounter for screening mammogram for malignant neoplasm of breast: Secondary | ICD-10-CM

## 2015-12-04 DIAGNOSIS — J0191 Acute recurrent sinusitis, unspecified: Secondary | ICD-10-CM | POA: Diagnosis not present

## 2015-12-04 DIAGNOSIS — J441 Chronic obstructive pulmonary disease with (acute) exacerbation: Secondary | ICD-10-CM | POA: Diagnosis not present

## 2015-12-04 DIAGNOSIS — J9611 Chronic respiratory failure with hypoxia: Secondary | ICD-10-CM | POA: Diagnosis not present

## 2015-12-04 DIAGNOSIS — G4733 Obstructive sleep apnea (adult) (pediatric): Secondary | ICD-10-CM | POA: Diagnosis not present

## 2015-12-13 ENCOUNTER — Other Ambulatory Visit: Payer: Self-pay | Admitting: Physician Assistant

## 2015-12-13 ENCOUNTER — Other Ambulatory Visit: Payer: Self-pay | Admitting: Primary Care

## 2015-12-13 ENCOUNTER — Ambulatory Visit
Admission: RE | Admit: 2015-12-13 | Discharge: 2015-12-13 | Disposition: A | Discharge status: Home / Self Care | Payer: Medicare Other | Source: Ambulatory Visit | Attending: Primary Care | Admitting: Primary Care

## 2015-12-13 DIAGNOSIS — Z1231 Encounter for screening mammogram for malignant neoplasm of breast: Secondary | ICD-10-CM

## 2015-12-13 DIAGNOSIS — J329 Chronic sinusitis, unspecified: Secondary | ICD-10-CM

## 2015-12-23 ENCOUNTER — Ambulatory Visit
Admission: RE | Admit: 2015-12-23 | Discharge: 2015-12-23 | Disposition: A | Discharge status: Home / Self Care | Payer: Medicare Other | Source: Ambulatory Visit | Attending: Physician Assistant | Admitting: Physician Assistant

## 2015-12-23 DIAGNOSIS — J329 Chronic sinusitis, unspecified: Secondary | ICD-10-CM | POA: Diagnosis not present

## 2015-12-25 DIAGNOSIS — R0602 Shortness of breath: Secondary | ICD-10-CM | POA: Diagnosis not present

## 2015-12-25 DIAGNOSIS — I270 Primary pulmonary hypertension: Secondary | ICD-10-CM | POA: Diagnosis not present

## 2016-01-09 DIAGNOSIS — J449 Chronic obstructive pulmonary disease, unspecified: Secondary | ICD-10-CM | POA: Diagnosis not present

## 2016-01-09 DIAGNOSIS — J320 Chronic maxillary sinusitis: Secondary | ICD-10-CM | POA: Diagnosis not present

## 2016-01-09 DIAGNOSIS — G4733 Obstructive sleep apnea (adult) (pediatric): Secondary | ICD-10-CM | POA: Diagnosis not present

## 2016-01-09 DIAGNOSIS — J9611 Chronic respiratory failure with hypoxia: Secondary | ICD-10-CM | POA: Diagnosis not present

## 2016-01-09 DIAGNOSIS — R0602 Shortness of breath: Secondary | ICD-10-CM | POA: Diagnosis not present

## 2016-01-23 DIAGNOSIS — J441 Chronic obstructive pulmonary disease with (acute) exacerbation: Secondary | ICD-10-CM | POA: Diagnosis not present

## 2016-01-25 ENCOUNTER — Emergency Department
Admission: EM | Admit: 2016-01-25 | Discharge: 2016-01-25 | Disposition: A | Discharge status: Home / Self Care | Payer: Medicare Other | Attending: Emergency Medicine | Admitting: Emergency Medicine

## 2016-01-25 ENCOUNTER — Encounter: Payer: Self-pay | Admitting: Emergency Medicine

## 2016-01-25 ENCOUNTER — Emergency Department: Payer: Medicare Other

## 2016-01-25 DIAGNOSIS — S20221A Contusion of right back wall of thorax, initial encounter: Secondary | ICD-10-CM | POA: Diagnosis not present

## 2016-01-25 DIAGNOSIS — W1839XA Other fall on same level, initial encounter: Secondary | ICD-10-CM | POA: Insufficient documentation

## 2016-01-25 DIAGNOSIS — S7011XA Contusion of right thigh, initial encounter: Secondary | ICD-10-CM | POA: Insufficient documentation

## 2016-01-25 DIAGNOSIS — Y939 Activity, unspecified: Secondary | ICD-10-CM | POA: Insufficient documentation

## 2016-01-25 DIAGNOSIS — M546 Pain in thoracic spine: Secondary | ICD-10-CM | POA: Diagnosis present

## 2016-01-25 DIAGNOSIS — Y999 Unspecified external cause status: Secondary | ICD-10-CM | POA: Diagnosis not present

## 2016-01-25 DIAGNOSIS — S299XXA Unspecified injury of thorax, initial encounter: Secondary | ICD-10-CM | POA: Diagnosis not present

## 2016-01-25 DIAGNOSIS — Y929 Unspecified place or not applicable: Secondary | ICD-10-CM | POA: Diagnosis not present

## 2016-01-25 DIAGNOSIS — Z87891 Personal history of nicotine dependence: Secondary | ICD-10-CM | POA: Insufficient documentation

## 2016-01-25 DIAGNOSIS — S3992XA Unspecified injury of lower back, initial encounter: Secondary | ICD-10-CM | POA: Diagnosis not present

## 2016-01-25 DIAGNOSIS — J45909 Unspecified asthma, uncomplicated: Secondary | ICD-10-CM | POA: Diagnosis not present

## 2016-01-25 DIAGNOSIS — I10 Essential (primary) hypertension: Secondary | ICD-10-CM | POA: Insufficient documentation

## 2016-01-25 DIAGNOSIS — T148XXA Other injury of unspecified body region, initial encounter: Secondary | ICD-10-CM

## 2016-01-25 LAB — CBC
HCT: 44.5 % (ref 35.0–47.0)
HEMOGLOBIN: 15 g/dL (ref 12.0–16.0)
MCH: 29.9 pg (ref 26.0–34.0)
MCHC: 33.6 g/dL (ref 32.0–36.0)
MCV: 89 fL (ref 80.0–100.0)
PLATELETS: 257 10*3/uL (ref 150–440)
RBC: 5 MIL/uL (ref 3.80–5.20)
RDW: 13.4 % (ref 11.5–14.5)
WBC: 11.3 10*3/uL — ABNORMAL HIGH (ref 3.6–11.0)

## 2016-01-25 LAB — BASIC METABOLIC PANEL
Anion gap: 9 (ref 5–15)
BUN: 19 mg/dL (ref 6–20)
CHLORIDE: 97 mmol/L — AB (ref 101–111)
CO2: 33 mmol/L — AB (ref 22–32)
CREATININE: 1.09 mg/dL — AB (ref 0.44–1.00)
Calcium: 9.5 mg/dL (ref 8.9–10.3)
GFR calc non Af Amer: 57 mL/min — ABNORMAL LOW (ref >60)
GLUCOSE: 148 mg/dL — AB (ref 65–99)
Potassium: 4.1 mmol/L (ref 3.5–5.1)
Sodium: 139 mmol/L (ref 135–145)

## 2016-01-25 LAB — TROPONIN I: Troponin I: <0.03 ng/mL (ref <0.03)

## 2016-01-25 MED ORDER — TRAMADOL HCL 50 MG PO TABS: 50.0000 mg | ORAL_TABLET | Freq: Four times a day (QID) | ORAL | 0 refills | Status: DC | PRN

## 2016-01-25 MED ORDER — KETOROLAC TROMETHAMINE 30 MG/ML IJ SOLN
30.0000 mg | Freq: Once | INTRAMUSCULAR | Status: AC
  Administered 2016-01-25: 30 mg via INTRAMUSCULAR
  Filled 2016-01-25: qty 1

## 2016-01-25 NOTE — ED Triage Notes (Signed)
Pt to ed with c/o fall last night in the middle of the night.  Pt states she was in her kitchen and fell asleep while standing and then fell.  Pt now c/o back pain.  Pt states she hit the back of her head when she fell. Pt alert and oriented.

## 2016-01-25 NOTE — ED Notes (Signed)
Pt reports she was standing, and started to fall asleep at approx 0200, and woke up on floor (Lost Consciousness). Pt reports she is unsure if she hit her head. Pt c/o of thoracic and lumbar back pain. Pt also reports a bruise on lateral right upper thigh.

## 2016-01-25 NOTE — ED Provider Notes (Signed)
[facility] Emergency Department Provider Note   ____________________________________________    I have reviewed the triage vital signs and the nursing notes.   HISTORY  Chief Complaint Fall     HPI Shelly [name] is a 55 y.o. female who presents with complaints of mid back pain. Patient reports she fell last night. She reports that she fell asleep standing up and fell backwards into a door frame. She denies head injury to me. No neck pain. No neuro deficits. No difficulty urinating, no saddle anesthesia. She reports she frequently falls asleep standing up and has been undergoing sleep studies. No chest pain no shortness breath or palpitations prior to the event. She complains of mild to moderate pain in the right paraspinal thoracic back   Past Medical History:  Diagnosis Date  . Asthma   . Hypertension     There are no active problems to display for this patient.   History reviewed. No pertinent surgical history.  Prior to Admission medications   Not on File     Allergies Review of patient's allergies indicates no known allergies.  Family History  Problem Relation Age of Onset  . Adopted: Yes    Social History Social History  Substance Use Topics  . Smoking status: Former Research scientist (life sciences)  . Smokeless tobacco: Never Used  . Alcohol use No    Review of Systems  Constitutional: No Dizziness Eyes: No visual changes.  ENT: NoNeck pain Cardiovascular: Denies chest pain. Respiratory: Denies shortness of breath. Gastrointestinal: No abdominal pain.  No nausea, no vomiting.   Genitourinary: Negative for Incontinence Musculoskeletal: As above Skin: Negative for Laceration. Neurological: Negative for headaches   10-point ROS otherwise negative.  ____________________________________________   PHYSICAL EXAM:  VITAL SIGNS: ED Triage Vitals  Enc Vitals Group     BP 01/25/16 1424 113/77     Pulse Rate 01/25/16 1424 91     Resp  01/25/16 1424 (!) 22     Temp 01/25/16 1424 97.6 F (36.4 C)     Temp Source 01/25/16 1424 Oral     SpO2 01/25/16 1424 92 %     Weight 01/25/16 1425 207 lb (93.9 kg)     Height 01/25/16 1425 5\' 5"  (1.651 m)     Head Circumference --      Peak Flow --      Pain Score 01/25/16 1425 7     Pain Loc --      Pain Edu? --      Excl. in [place]? --     Constitutional: Alert and oriented. No acute distress. Pleasant and interactive Eyes: Conjunctivae are normal.  Head: Atraumatic. Nose: No congestion/rhinnorhea. Mouth/Throat: Mucous membranes are moist.   Neck:  Painless ROM, No vertebral tenderness to palpation Cardiovascular: Normal rate, regular rhythm. Grossly normal heart sounds.  Good peripheral circulation. Respiratory: Normal respiratory effort.  No retractions. Lungs CTAB. Gastrointestinal: Soft and nontender. No distention.  No CVA tenderness. Genitourinary: deferred Musculoskeletal: Back: No vertebral tenderness to palpation, mild tenderness to palpation paraspinally on the right at approximately T5-T11. discomfort with flexion, normal strength in the lower extremities Neurologic:  Normal speech and language. No gross focal neurologic deficits are appreciated.  Skin:  Skin is warm, dry and intact. No rash noted. Psychiatric: Mood and affect are normal. Speech and behavior are normal.  ____________________________________________   LABS (all labs ordered are listed, but only abnormal results are displayed)  Labs Reviewed  BASIC METABOLIC PANEL - Abnormal; Notable for  the following:       Result Value   Chloride 97 (*)    CO2 33 (*)    Glucose, Bld 148 (*)    Creatinine, Ser 1.09 (*)    GFR calc non Af Amer 57 (*)    All other components within normal limits  CBC - Abnormal; Notable for the following:    WBC 11.3 (*)    All other components within normal limits  TROPONIN I  URINALYSIS COMPLETEWITH MICROSCOPIC ([facility] ONLY)    ____________________________________________  EKG  ED ECG REPORT I, Lavonia Drafts, the attending physician, personally viewed and interpreted this ECG.  Date: 01/25/2016 EKG Time: 2:32 PM Rate: 83 Rhythm: normal sinus rhythm QRS Axis: normal Intervals: normal ST/T Wave abnormalities: normal Conduction Disturbances: none Narrative Interpretation: unremarkable  ____________________________________________  RADIOLOGY  X-rays unremarkable ____________________________________________   PROCEDURES  Procedure(s) performed: No    Critical Care performed: No ____________________________________________   INITIAL IMPRESSION / ASSESSMENT AND PLAN / ED COURSE  Pertinent labs & imaging results that were available during my care of the patient were reviewed by me and considered in my medical decision making (see chart for details).  Patient well-appearing and in no distress. Suspect muscular skeletal injury, x-rays pending. Lab works remarkable. EKG is normal.  Clinical Course  X-rays negative. We'll discharge with analgesics, PCP follow-up as needed. ____________________________________________   FINAL CLINICAL IMPRESSION(S) / ED DIAGNOSES  Final diagnoses:  Contusion  Right-sided thoracic back pain      NEW MEDICATIONS STARTED DURING THIS VISIT:  New Prescriptions   No medications on file     Note:  This document was prepared using Dragon voice recognition software and may include unintentional dictation errors.    Lavonia Drafts, MD 01/25/16 [phone]

## 2016-01-25 NOTE — ED Notes (Signed)
  Reviewed d/c instructions, follow-up care, and prescriptions with pt. Pt verbalized understanding 

## 2016-01-27 DIAGNOSIS — R600 Localized edema: Secondary | ICD-10-CM | POA: Diagnosis not present

## 2016-01-27 DIAGNOSIS — J449 Chronic obstructive pulmonary disease, unspecified: Secondary | ICD-10-CM | POA: Diagnosis not present

## 2016-01-27 DIAGNOSIS — M545 Low back pain: Secondary | ICD-10-CM | POA: Diagnosis not present

## 2016-02-04 DIAGNOSIS — Z1389 Encounter for screening for other disorder: Secondary | ICD-10-CM | POA: Diagnosis not present

## 2016-02-04 DIAGNOSIS — E669 Obesity, unspecified: Secondary | ICD-10-CM | POA: Diagnosis not present

## 2016-02-04 DIAGNOSIS — M545 Low back pain: Secondary | ICD-10-CM | POA: Diagnosis not present

## 2016-02-04 DIAGNOSIS — R7309 Other abnormal glucose: Secondary | ICD-10-CM | POA: Diagnosis not present

## 2016-02-04 DIAGNOSIS — J449 Chronic obstructive pulmonary disease, unspecified: Secondary | ICD-10-CM | POA: Diagnosis not present

## 2016-02-17 DIAGNOSIS — J320 Chronic maxillary sinusitis: Secondary | ICD-10-CM | POA: Diagnosis not present

## 2016-02-17 DIAGNOSIS — G4733 Obstructive sleep apnea (adult) (pediatric): Secondary | ICD-10-CM | POA: Diagnosis not present

## 2016-02-17 DIAGNOSIS — J441 Chronic obstructive pulmonary disease with (acute) exacerbation: Secondary | ICD-10-CM | POA: Diagnosis not present

## 2016-02-22 DIAGNOSIS — M545 Low back pain: Secondary | ICD-10-CM | POA: Diagnosis not present

## 2016-02-27 DIAGNOSIS — I10 Essential (primary) hypertension: Secondary | ICD-10-CM | POA: Diagnosis not present

## 2016-02-27 DIAGNOSIS — M545 Low back pain: Secondary | ICD-10-CM | POA: Diagnosis not present

## 2016-02-27 DIAGNOSIS — J449 Chronic obstructive pulmonary disease, unspecified: Secondary | ICD-10-CM | POA: Diagnosis not present

## 2016-03-10 DIAGNOSIS — Z1329 Encounter for screening for other suspected endocrine disorder: Secondary | ICD-10-CM | POA: Diagnosis not present

## 2016-03-10 DIAGNOSIS — M545 Low back pain: Secondary | ICD-10-CM | POA: Diagnosis not present

## 2016-03-10 DIAGNOSIS — E119 Type 2 diabetes mellitus without complications: Secondary | ICD-10-CM | POA: Diagnosis not present

## 2016-03-10 DIAGNOSIS — E669 Obesity, unspecified: Secondary | ICD-10-CM | POA: Diagnosis not present

## 2016-03-10 DIAGNOSIS — I10 Essential (primary) hypertension: Secondary | ICD-10-CM | POA: Diagnosis not present

## 2016-03-24 ENCOUNTER — Encounter: Payer: Self-pay | Admitting: Emergency Medicine

## 2016-03-24 ENCOUNTER — Emergency Department
Admission: EM | Admit: 2016-03-24 | Discharge: 2016-03-25 | Disposition: A | Discharge status: Home / Self Care | Payer: Medicare Other | Attending: Emergency Medicine | Admitting: Emergency Medicine

## 2016-03-24 DIAGNOSIS — J449 Chronic obstructive pulmonary disease, unspecified: Secondary | ICD-10-CM | POA: Diagnosis not present

## 2016-03-24 DIAGNOSIS — R1030 Lower abdominal pain, unspecified: Secondary | ICD-10-CM | POA: Diagnosis present

## 2016-03-24 DIAGNOSIS — J45909 Unspecified asthma, uncomplicated: Secondary | ICD-10-CM | POA: Insufficient documentation

## 2016-03-24 DIAGNOSIS — R51 Headache: Secondary | ICD-10-CM | POA: Diagnosis not present

## 2016-03-24 DIAGNOSIS — R197 Diarrhea, unspecified: Secondary | ICD-10-CM | POA: Diagnosis not present

## 2016-03-24 DIAGNOSIS — Z79899 Other long term (current) drug therapy: Secondary | ICD-10-CM | POA: Diagnosis not present

## 2016-03-24 DIAGNOSIS — Z87891 Personal history of nicotine dependence: Secondary | ICD-10-CM | POA: Diagnosis not present

## 2016-03-24 DIAGNOSIS — I10 Essential (primary) hypertension: Secondary | ICD-10-CM | POA: Insufficient documentation

## 2016-03-24 DIAGNOSIS — J441 Chronic obstructive pulmonary disease with (acute) exacerbation: Secondary | ICD-10-CM | POA: Diagnosis not present

## 2016-03-24 DIAGNOSIS — R519 Headache, unspecified: Secondary | ICD-10-CM

## 2016-03-24 DIAGNOSIS — R110 Nausea: Secondary | ICD-10-CM | POA: Diagnosis not present

## 2016-03-24 DIAGNOSIS — E119 Type 2 diabetes mellitus without complications: Secondary | ICD-10-CM | POA: Diagnosis not present

## 2016-03-24 DIAGNOSIS — R1032 Left lower quadrant pain: Secondary | ICD-10-CM | POA: Diagnosis not present

## 2016-03-24 HISTORY — DX: Type 2 diabetes mellitus without complications: E11.9

## 2016-03-24 LAB — URINALYSIS COMPLETE WITH MICROSCOPIC (ARMC ONLY)
BILIRUBIN URINE: NEGATIVE
GLUCOSE, UA: NEGATIVE mg/dL
Ketones, ur: NEGATIVE mg/dL
Leukocytes, UA: NEGATIVE
NITRITE: NEGATIVE
Protein, ur: NEGATIVE mg/dL
SPECIFIC GRAVITY, URINE: 1.021 (ref 1.005–1.030)
pH: 5 (ref 5.0–8.0)

## 2016-03-24 LAB — CBC
HEMATOCRIT: 45 % (ref 35.0–47.0)
Hemoglobin: 14.7 g/dL (ref 12.0–16.0)
MCH: 29.5 pg (ref 26.0–34.0)
MCHC: 32.8 g/dL (ref 32.0–36.0)
MCV: 90.1 fL (ref 80.0–100.0)
PLATELETS: 262 10*3/uL (ref 150–440)
RBC: 4.99 MIL/uL (ref 3.80–5.20)
RDW: 13.4 % (ref 11.5–14.5)
WBC: 12.1 10*3/uL — ABNORMAL HIGH (ref 3.6–11.0)

## 2016-03-24 LAB — COMPREHENSIVE METABOLIC PANEL
ALBUMIN: 4.1 g/dL (ref 3.5–5.0)
ALK PHOS: 60 U/L (ref 38–126)
ALT: 23 U/L (ref 14–54)
AST: 23 U/L (ref 15–41)
Anion gap: 6 (ref 5–15)
BILIRUBIN TOTAL: 0.4 mg/dL (ref 0.3–1.2)
BUN: 14 mg/dL (ref 6–20)
CALCIUM: 9.4 mg/dL (ref 8.9–10.3)
CO2: 33 mmol/L — ABNORMAL HIGH (ref 22–32)
CREATININE: 1.02 mg/dL — AB (ref 0.44–1.00)
Chloride: 99 mmol/L — ABNORMAL LOW (ref 101–111)
GFR calc Af Amer: >60 mL/min (ref >60)
GLUCOSE: 95 mg/dL (ref 65–99)
POTASSIUM: 4.5 mmol/L (ref 3.5–5.1)
Sodium: 138 mmol/L (ref 135–145)
TOTAL PROTEIN: 7.7 g/dL (ref 6.5–8.1)

## 2016-03-24 LAB — LIPASE, BLOOD: Lipase: 31 U/L (ref 11–51)

## 2016-03-24 MED ORDER — SODIUM CHLORIDE 0.9 % IV BOLUS (SEPSIS)
1000.0000 mL | Freq: Once | INTRAVENOUS | Status: AC
  Administered 2016-03-25: 1000 mL via INTRAVENOUS

## 2016-03-24 MED ORDER — DIPHENHYDRAMINE HCL 50 MG/ML IJ SOLN
25.0000 mg | Freq: Once | INTRAMUSCULAR | Status: AC
  Administered 2016-03-25: 25 mg via INTRAVENOUS
  Filled 2016-03-24: qty 1

## 2016-03-24 MED ORDER — IOPAMIDOL (ISOVUE-300) INJECTION 61%
30.0000 mL | Freq: Once | INTRAVENOUS | Status: AC | PRN
  Administered 2016-03-25: 30 mL via ORAL

## 2016-03-24 MED ORDER — METOCLOPRAMIDE HCL 5 MG/ML IJ SOLN
10.0000 mg | Freq: Once | INTRAMUSCULAR | Status: AC
  Administered 2016-03-25: 10 mg via INTRAVENOUS
  Filled 2016-03-24: qty 2

## 2016-03-24 MED ORDER — MORPHINE SULFATE (PF) 4 MG/ML IV SOLN: 4.0000 mg | Freq: Once | INTRAVENOUS | Status: DC

## 2016-03-24 NOTE — ED Triage Notes (Signed)
Pt ambulatory to triage with steady gait, no distress noted. Pt c/o lower Abdominal pain, nausea and diarrhea x3 days. Pt to ED due to waking up this AM with a headache that she is unable to find relief with OTC meds. Pt denies visual changes.

## 2016-03-25 ENCOUNTER — Emergency Department: Payer: Medicare Other

## 2016-03-25 DIAGNOSIS — J441 Chronic obstructive pulmonary disease with (acute) exacerbation: Secondary | ICD-10-CM | POA: Diagnosis not present

## 2016-03-25 DIAGNOSIS — R1032 Left lower quadrant pain: Secondary | ICD-10-CM | POA: Diagnosis not present

## 2016-03-25 LAB — BLOOD GAS, VENOUS
Acid-Base Excess: 3.9 mmol/L — ABNORMAL HIGH (ref 0.0–2.0)
Bicarbonate: 32.2 mmol/L — ABNORMAL HIGH (ref 20.0–28.0)
PCO2 VEN: 64 mmHg — AB (ref 44.0–60.0)
Patient temperature: 37
pH, Ven: 7.31 (ref 7.250–7.430)

## 2016-03-25 MED ORDER — METHYLPREDNISOLONE SODIUM SUCC 125 MG IJ SOLR
125.0000 mg | Freq: Once | INTRAMUSCULAR | Status: AC
  Administered 2016-03-25: 125 mg via INTRAVENOUS
  Filled 2016-03-25: qty 2

## 2016-03-25 MED ORDER — IPRATROPIUM-ALBUTEROL 0.5-2.5 (3) MG/3ML IN SOLN
3.0000 mL | Freq: Once | RESPIRATORY_TRACT | Status: AC
  Administered 2016-03-25: 3 mL via RESPIRATORY_TRACT
  Filled 2016-03-25: qty 3

## 2016-03-25 MED ORDER — PREDNISONE 20 MG PO TABS: 60.0000 mg | ORAL_TABLET | Freq: Every day | ORAL | 0 refills | Status: DC

## 2016-03-25 MED ORDER — SODIUM CHLORIDE 0.9 % IV BOLUS (SEPSIS)
1000.0000 mL | Freq: Once | INTRAVENOUS | Status: AC
  Administered 2016-03-25: 1000 mL via INTRAVENOUS

## 2016-03-25 MED ORDER — BUTALBITAL-APAP-CAFFEINE 50-325-40 MG PO TABS: 1.0000 | ORAL_TABLET | Freq: Four times a day (QID) | ORAL | 0 refills | Status: AC | PRN

## 2016-03-25 MED ORDER — MAGNESIUM SULFATE 2 GM/50ML IV SOLN
2.0000 g | Freq: Once | INTRAVENOUS | Status: AC
  Administered 2016-03-25: 2 g via INTRAVENOUS

## 2016-03-25 MED ORDER — MORPHINE SULFATE (PF) 2 MG/ML IV SOLN
INTRAVENOUS | Status: AC
  Administered 2016-03-25: 2 mg via INTRAVENOUS
  Filled 2016-03-25: qty 1

## 2016-03-25 MED ORDER — KETOROLAC TROMETHAMINE 30 MG/ML IJ SOLN
30.0000 mg | Freq: Once | INTRAMUSCULAR | Status: AC
  Administered 2016-03-25: 30 mg via INTRAVENOUS
  Filled 2016-03-25: qty 1

## 2016-03-25 MED ORDER — MORPHINE SULFATE (PF) 2 MG/ML IV SOLN
2.0000 mg | Freq: Once | INTRAVENOUS | Status: AC
  Administered 2016-03-25: 2 mg via INTRAVENOUS

## 2016-03-25 MED ORDER — MAGNESIUM SULFATE 2 GM/50ML IV SOLN
INTRAVENOUS | Status: AC
  Administered 2016-03-25: 2 g via INTRAVENOUS
  Filled 2016-03-25: qty 50

## 2016-03-25 MED ORDER — IOPAMIDOL (ISOVUE-300) INJECTION 61%
100.0000 mL | Freq: Once | INTRAVENOUS | Status: AC | PRN
  Administered 2016-03-25: 100 mL via INTRAVENOUS

## 2016-03-25 MED ORDER — ONDANSETRON 4 MG PO TBDP: 4.0000 mg | ORAL_TABLET | Freq: Three times a day (TID) | ORAL | 0 refills | Status: DC | PRN

## 2016-03-25 NOTE — Discharge Instructions (Signed)
You were seen and treated tonight for a headache. You do have some flareup of her COPD and you do have a CT scan that is negative. While we have given him medications for headache you still do have some pain. We discussed doing further studies to include a lumbar puncture as well as further evaluation. The decision was made that he wanted to go home. Please return to the emergency department with any persistent or worsening symptoms. Otherwise you may follow-up with your primary care physician.

## 2016-03-25 NOTE — ED Notes (Signed)
Merian Wroe RN helped the Pt go to the bathroom. Pt returned to her bed without difficulty.

## 2016-03-25 NOTE — ED Notes (Signed)
Pt reports she is on O2 at home, 3L at night time. Pt provided O2 through nasal cannula .

## 2016-03-25 NOTE — ED Provider Notes (Signed)
[facility] Emergency Department Provider Note   ____________________________________________   First MD Initiated Contact with Patient 03/24/16 2343     (approximate)  I have reviewed the triage vital signs and the nursing notes.   HISTORY  Chief Complaint Abdominal Pain and Headache    HPI Shelly [name] is a 55 y.o. female comes into the hospital today with a migraine headache. She reports that she does not get all of the time but occasionally she gets really bad migraines. The patient reports that she's had a stomachache for the past 2 days and she's had some diarrhea as well. The patient has had some nausea with no vomiting. She woke up last night with a migraine. The patient took multiple doses of Motrin but it did not help. The patient's spouse reports that he checked her blood pressure at home and it was in the low 80s. She has pain across her mid abdomen that she rates a 10 out of 10 in intensity. She reports that her head is also about bad. Her eyes and ears hurt and it hurts when she coughs. The patient is sensitive to light and sounds. She denies any chest pain, shortness of breath. She does have COPD and reports that she does cough with worsened pain. The patient reports that she's had pain this bad in the past and that this is not the worse headache. The patient is here for evaluation today.   Past Medical History:  Diagnosis Date  . Asthma   . Diabetes mellitus without complication ([place])   . Hypertension     There are no active problems to display for this patient.   History reviewed. No pertinent surgical history.  Prior to Admission medications   Medication Sig Start Date End Date Taking? Authorizing Provider  butalbital-acetaminophen-caffeine (FIORICET, ESGIC) 50-325-40 MG tablet Take 1-2 tablets by mouth every 6 (six) hours as needed for headache. 03/25/16 03/25/17  Loney Hering, MD  ondansetron (ZOFRAN ODT) 4 MG disintegrating  tablet Take 1 tablet (4 mg total) by mouth every 8 (eight) hours as needed for nausea or vomiting. 03/25/16   Loney Hering, MD  predniSONE (DELTASONE) 20 MG tablet Take 3 tablets (60 mg total) by mouth daily. 03/25/16   Loney Hering, MD  traMADol (ULTRAM) 50 MG tablet Take 1 tablet (50 mg total) by mouth every 6 (six) hours as needed. 01/25/16 01/24/17  Lavonia Drafts, MD    Allergies Review of patient's allergies indicates no known allergies.  Family History  Problem Relation Age of Onset  . Adopted: Yes    Social History Social History  Substance Use Topics  . Smoking status: Former Research scientist (life sciences)  . Smokeless tobacco: Never Used  . Alcohol use No    Review of Systems Constitutional: No fever/chills Eyes: No visual changes. ENT: No sore throat. Cardiovascular: Denies chest pain. Respiratory: Denies shortness of breath. Gastrointestinal:  abdominal pain., nausea,  diarrhea.  No constipation. Genitourinary: Negative for dysuria. Musculoskeletal: Negative for back pain. Skin: Negative for rash. Neurological: Headache  10-point ROS otherwise negative.  ____________________________________________   PHYSICAL EXAM:  VITAL SIGNS: ED Triage Vitals  Enc Vitals Group     BP 03/24/16 1941 119/75     Pulse Rate 03/24/16 1941 93     Resp 03/24/16 1941 15     Temp 03/24/16 1941 98.3 F (36.8 C)     Temp Source 03/24/16 1941 Oral     SpO2 03/24/16 1941 97 %  Weight 03/24/16 1942 200 lb (90.7 kg)     Height 03/24/16 1942 5\' 5"  (1.651 m)     Head Circumference --      Peak Flow --      Pain Score 03/24/16 1942 10     Pain Loc --      Pain Edu? --      Excl. in [place]? --     Constitutional: Alert and oriented. Well appearing and in no acute distress. Eyes: Conjunctivae are normal. PERRL. EOMI. Head: Atraumatic. Nose: No congestion/rhinnorhea. Mouth/Throat: Mucous membranes are moist.  Oropharynx non-erythematous. Cardiovascular: Normal rate, regular rhythm. Grossly  normal heart sounds.  Good peripheral circulation. Respiratory: Normal respiratory effort.  No retractions. Expiratory wheezes in all lung fields Gastrointestinal: Soft with some left sided tenderness to palpation. No distention.  Musculoskeletal: No lower extremity tenderness nor edema.   Neurologic:  Normal speech and language. Cranial nerves II through XII are grossly intact with no focal motor or neuro deficits Skin:  Skin is warm, dry and intact.  Psychiatric: Mood and affect are normal.   ____________________________________________   LABS (all labs ordered are listed, but only abnormal results are displayed)  Labs Reviewed  COMPREHENSIVE METABOLIC PANEL - Abnormal; Notable for the following:       Result Value   Chloride 99 (*)    CO2 33 (*)    Creatinine, Ser 1.02 (*)    All other components within normal limits  CBC - Abnormal; Notable for the following:    WBC 12.1 (*)    All other components within normal limits  URINALYSIS COMPLETEWITH MICROSCOPIC ([facility] ONLY) - Abnormal; Notable for the following:    Color, Urine YELLOW (*)    APPearance CLEAR (*)    Hgb urine dipstick 1+ (*)    Bacteria, UA RARE (*)    Squamous Epithelial / LPF 0-5 (*)    All other components within normal limits  BLOOD GAS, VENOUS - Abnormal; Notable for the following:    pCO2, Ven 64 (*)    Bicarbonate 32.2 (*)    Acid-Base Excess 3.9 (*)    All other components within normal limits  LIPASE, BLOOD   ____________________________________________  EKG  ED ECG REPORT I, Loney Hering, the attending physician, personally viewed and interpreted this ECG.   Date: 03/25/2016  EKG Time: 101  Rate: 84  Rhythm: normal sinus rhythm  Axis: normal  Intervals:none  ST&T Change: none   ED ECG REPORT I, Loney Hering, the attending physician, personally viewed and interpreted this ECG.   Date: 03/25/2016  EKG Time: 404  Rate: 96  Rhythm: normal sinus rhythm  Axis: normal   Intervals:none with multiple PVCs  ST&T Change: none  ____________________________________________  RADIOLOGY  CT abd and pelvis ____________________________________________   PROCEDURES  Procedure(s) performed: None  Procedures  Critical Care performed: No  ____________________________________________   INITIAL IMPRESSION / ASSESSMENT AND PLAN / ED COURSE  Pertinent labs & imaging results that were available during my care of the patient were reviewed by me and considered in my medical decision making (see chart for details).  This is a 55 year old female who comes into the hospital today with a headache as well as some abdominal pain and diarrhea. The patient does have a migraine which may be due to some dehydration. The patient's husband reports that she did have some low blood pressure earlier. Given the patient's pain as well as her history of diverticulitis I will send the  patient for a CT scan of her abdomen. She does have some diffuse pain but it's worse on the left side. I will also send an EKG and a troponin. I will give the patient liter of normal saline, Reglan, Benadryl and a dose of morphine. I will reassess the patient once I received her results. The patient will also receive a DuoNeb for her wheezing.  Clinical Course  Value Comment By Time  CT Abdomen Pelvis W Contrast No acute intra-abdominal or pelvic pathology. No evidence of colitis as clinically question.   Loney Hering, MD 10/18 [phone]   The patient did have some continued tachycardia so I gave her a second liter of normal saline. I also checked a VBG which showed that the patient had an elevated PCO2. The patient's pH at this time is unremarkable. I gave the patient a second DuoNeb as well as some Solu-Medrol and magnesium sulfate. The patient also received a small dose of morphine. I did go in to check on the patient and she reports that her eyes hurt. I asked her about her head and she reports that  she does still feel some headache but it is better. I asked the patient if she would like for me to do some further evaluation with a lumbar puncture. The patient reports that at this time she just wants to go home. I did inform the patient of the risks of not further evaluating this headache but she reports that this is not uncommon and she just is ready to go home and go to bed. I did encourage the patient to return should she have any worsening symptoms. She should also return if she has any other concerns. The patient will be discharged to follow-up with her primary care physician or return with worsening symptoms. The patient's CT scan is unremarkable.  ____________________________________________   FINAL CLINICAL IMPRESSION(S) / ED DIAGNOSES  Final diagnoses:  Acute nonintractable headache, unspecified headache type  Diarrhea, unspecified type  COPD exacerbation ([place])      NEW MEDICATIONS STARTED DURING THIS VISIT:  New Prescriptions   BUTALBITAL-ACETAMINOPHEN-CAFFEINE (FIORICET, ESGIC) 50-325-40 MG TABLET    Take 1-2 tablets by mouth every 6 (six) hours as needed for headache.   ONDANSETRON (ZOFRAN ODT) 4 MG DISINTEGRATING TABLET    Take 1 tablet (4 mg total) by mouth every 8 (eight) hours as needed for nausea or vomiting.   PREDNISONE (DELTASONE) 20 MG TABLET    Take 3 tablets (60 mg total) by mouth daily.     Note:  This document was prepared using Dragon voice recognition software and may include unintentional dictation errors.    Loney Hering, MD 03/25/16 [phone]

## 2016-04-02 DIAGNOSIS — M4854XA Collapsed vertebra, not elsewhere classified, thoracic region, initial encounter for fracture: Secondary | ICD-10-CM | POA: Diagnosis not present

## 2016-04-02 DIAGNOSIS — M4856XA Collapsed vertebra, not elsewhere classified, lumbar region, initial encounter for fracture: Secondary | ICD-10-CM | POA: Diagnosis not present

## 2016-04-02 DIAGNOSIS — M542 Cervicalgia: Secondary | ICD-10-CM | POA: Insufficient documentation

## 2016-04-03 ENCOUNTER — Other Ambulatory Visit: Payer: Self-pay | Admitting: Orthopedic Surgery

## 2016-04-03 ENCOUNTER — Other Ambulatory Visit: Payer: Self-pay | Admitting: Specialist

## 2016-04-03 DIAGNOSIS — M4856XA Collapsed vertebra, not elsewhere classified, lumbar region, initial encounter for fracture: Secondary | ICD-10-CM

## 2016-04-14 DIAGNOSIS — M545 Low back pain: Secondary | ICD-10-CM | POA: Diagnosis not present

## 2016-04-14 DIAGNOSIS — E669 Obesity, unspecified: Secondary | ICD-10-CM | POA: Diagnosis not present

## 2016-04-14 DIAGNOSIS — R7309 Other abnormal glucose: Secondary | ICD-10-CM | POA: Diagnosis not present

## 2016-04-16 ENCOUNTER — Ambulatory Visit
Admission: RE | Admit: 2016-04-16 | Discharge: 2016-04-16 | Disposition: A | Discharge status: Home / Self Care | Payer: Medicare Other | Source: Ambulatory Visit | Attending: Orthopedic Surgery | Admitting: Orthopedic Surgery

## 2016-04-16 DIAGNOSIS — M4856XA Collapsed vertebra, not elsewhere classified, lumbar region, initial encounter for fracture: Secondary | ICD-10-CM | POA: Diagnosis present

## 2016-04-16 DIAGNOSIS — M5124 Other intervertebral disc displacement, thoracic region: Secondary | ICD-10-CM | POA: Diagnosis not present

## 2016-04-16 DIAGNOSIS — M545 Low back pain: Secondary | ICD-10-CM | POA: Diagnosis not present

## 2016-04-22 DIAGNOSIS — J9611 Chronic respiratory failure with hypoxia: Secondary | ICD-10-CM | POA: Diagnosis not present

## 2016-04-22 DIAGNOSIS — G4733 Obstructive sleep apnea (adult) (pediatric): Secondary | ICD-10-CM | POA: Diagnosis not present

## 2016-04-22 DIAGNOSIS — R05 Cough: Secondary | ICD-10-CM | POA: Diagnosis not present

## 2016-04-22 DIAGNOSIS — J441 Chronic obstructive pulmonary disease with (acute) exacerbation: Secondary | ICD-10-CM | POA: Diagnosis not present

## 2016-04-22 DIAGNOSIS — M545 Low back pain: Secondary | ICD-10-CM | POA: Diagnosis not present

## 2016-05-12 DIAGNOSIS — E669 Obesity, unspecified: Secondary | ICD-10-CM | POA: Diagnosis not present

## 2016-05-12 DIAGNOSIS — E119 Type 2 diabetes mellitus without complications: Secondary | ICD-10-CM | POA: Diagnosis not present

## 2016-05-12 DIAGNOSIS — M545 Low back pain: Secondary | ICD-10-CM | POA: Diagnosis not present

## 2016-05-12 DIAGNOSIS — R7309 Other abnormal glucose: Secondary | ICD-10-CM | POA: Diagnosis not present

## 2016-06-03 DIAGNOSIS — J441 Chronic obstructive pulmonary disease with (acute) exacerbation: Secondary | ICD-10-CM | POA: Diagnosis not present

## 2016-06-04 DIAGNOSIS — M545 Low back pain: Secondary | ICD-10-CM | POA: Diagnosis not present

## 2016-06-05 DIAGNOSIS — J440 Chronic obstructive pulmonary disease with acute lower respiratory infection: Secondary | ICD-10-CM | POA: Diagnosis not present

## 2016-06-05 DIAGNOSIS — J441 Chronic obstructive pulmonary disease with (acute) exacerbation: Secondary | ICD-10-CM | POA: Diagnosis not present

## 2016-06-10 ENCOUNTER — Ambulatory Visit: Payer: Medicare Other | Attending: Primary Care | Admitting: Physical Therapy

## 2016-06-10 DIAGNOSIS — R262 Difficulty in walking, not elsewhere classified: Secondary | ICD-10-CM | POA: Diagnosis not present

## 2016-06-10 DIAGNOSIS — G8929 Other chronic pain: Secondary | ICD-10-CM | POA: Diagnosis not present

## 2016-06-10 DIAGNOSIS — M545 Low back pain: Secondary | ICD-10-CM | POA: Insufficient documentation

## 2016-06-10 NOTE — Therapy (Signed)
[facility] [address] [place], [address] Phone: [phone]   Fax:  [phone]  Physical Therapy Evaluation  Patient Details  Name: Shelly [name] MRN: [id number] Date of Birth: 01-16-1961 Referring Provider: [name] FNP  Encounter Date: 06/10/2016      PT End of Session - 06/10/16 1634    Visit Number 1   Number of Visits 13   Date for PT Re-Evaluation 07/29/16   PT Start Time [id number]   PT Stop Time 1615   PT Time Calculation (min) 60 min   Activity Tolerance Patient limited by pain   Behavior During Therapy [facility] for tasks assessed/performed      Past Medical History:  Diagnosis Date  . Asthma   . Diabetes mellitus without complication ([place])   . Hypertension     No past surgical history on file.  There were no vitals filed for this visit.       Subjective Assessment - 06/10/16 1645    Subjective Patient reports she fell roughly 3 months ago after she had woken up in the middle of the night, hitting the L side of her upper and lower back. She reports she has had roughly constant pain since that time, especially in her lumbar area. She denies any numbness/tingling or radiating pain associated with this injury. She denies any blood in urine or difficulty with bowels. She reports she does have aching in her legs, though this was present before the fall. She reports she previously had a back injury where she heard a pop while pulling something years ago, but that had resolved prior to this fall.    Limitations Lifting;Sitting;Walking;House hold activities   Diagnostic tests MRI/X-ray without evidence of fracture or significant disc bulge.    Patient Stated Goals To not have the constant pain anymore.    Currently in Pain? Yes   Pain Score 8    Pain Location Back   Pain Orientation Left;Lower   Pain Descriptors / Indicators Aching;Sharp   Pain Type Chronic pain;Acute pain   Pain Onset More  than a month ago   Pain Frequency Constant   Aggravating Factors  Lifting, sitting   Pain Relieving Factors Heat            [facility] PT Assessment - 06/10/16 1649      Assessment   Medical Diagnosis Low back pain   Referring Provider [name] FNP     Precautions   Precautions None     Restrictions   Weight Bearing Restrictions No     Balance Screen   Has the patient fallen in the past 6 months Yes   How many times? [place] residence   Living Arrangements Spouse/significant other     Prior Function   Level of Independence Independent   Vocation On disability   Leisure Wants to go back to the gym to lose weight and increase lung capacity     Cognition   Overall Cognitive Status Within Functional Limits for tasks assessed     Observation/Other Assessments   Modified [place] 56     Sensation   Light Touch Appears Intact     Sit to Stand   Comments --  Leans to R, uses L hand     Palpation   Palpation comment --  Pain over L lumbar flank, hyper-algesia noted     Slump test  Findings Negative     Straight Leg Raise   Findings Negative       Supine knee flexion - painful  L hip ER, R hip IR/ER both painful   Bridging was painful in lumbar area   Standing rotations patient reported as feeling stabbing pain in lumbar spine  Standing flexion to knees (pain throughout)  Standing extension less than 25% of anticipated ROM and pain   Unable to tolerate DKTC, attempted STM to lumbar spine, she reports this became too painful. She was unable to tolerate seated thoracic extensions.   Provided hot pack and [place] stim (continuous) at 10mA x 10 minutes, she reports pain lessened after these modalities.                     PT Education - 06/10/16 1633    Education provided Yes   Education Details She can call her MD to see if she can take any pain medication to help calm things down, will work on  heat/e-stim in next session(s) to reduce pain.    Person(s) Educated Patient   Methods Explanation   Comprehension Verbalized understanding             PT Long Term Goals - 06/10/16 1634      PT LONG TERM GOAL #1   Title Patient will report worst pain of less than 5/10 to demonstrate improved tolerance for ADLs.    Baseline 10/10   Time 7   Period Weeks   Status New     PT LONG TERM GOAL #2   Title Patient will report modified ODI of less than 45% disability to demonstrate improved tolerance for ADLs.    Baseline 56%   Time 7   Period Weeks   Status New     PT LONG TERM GOAL #3   Title Patient will demonstrate at least 75% of baseline AROM into flexion/extension in standing with no more than 1/10 increase in pain to demonstrate improved tolerance for ADLs.    Baseline ~25% mobility with severe pain currently.    Time 7   Period Weeks   Status New               Plan - 06/10/16 1638    Clinical Impression Statement Patient is a 56 y/o female that presents with sub-acute low back pain s/p mechanical fall. She has a history of low back injury, though was not having back pain prior to this episode. She reports she fell posteriorly, and has had fairly constant pain in L lumbar flank since that time. From this evaluation it appears to have muscular involvement, as pain increased and dissipiated with passive and active movements, paritcularly hip flexion. She may also have involvement of hip joint as she reported reproduction of pain with IR/ER of the hip. She was too tender to tolerate any stretching or soft tissue mobilization in this session, she reports she finds relief from heat. PT will attempt to focus on pain control first before retraining for functional activities.    Rehab Potential Fair   Clinical Impairments Affecting Rehab Potential COPD, history of lower back injury.    PT Frequency 2x / week   PT Duration 6 weeks   PT Treatment/Interventions Aquatic  Therapy;Cryotherapy;Electrical Stimulation;Iontophoresis 4mg /ml Dexamethasone;Moist Heat;Traction;Ultrasound;Neuromuscular re-education;Balance training;Therapeutic exercise;Therapeutic activities;Stair training;Gait training;Dry needling;Taping;Manual techniques   PT Next Visit Plan Heat and e-stim.    Consulted and Agree with Plan of Care Patient      Patient  will benefit from skilled therapeutic intervention in order to improve the following deficits and impairments:  Abnormal gait, Decreased endurance, Decreased activity tolerance, Pain, Decreased balance, Decreased mobility, Difficulty walking, Increased muscle spasms, Decreased range of motion  Visit Diagnosis: Chronic midline low back pain without sciatica - Plan: PT plan of care cert/re-cert  Difficulty in walking, not elsewhere classified - Plan: PT plan of care cert/re-cert      G-Codes - [id number]    Functional Assessment Tool Used Modified ODI, Patient Report, Clinical judgement    Functional Limitation Mobility: Walking and moving around   Mobility: Walking and Moving Around Current Status [id number]) At least 40 percent but less than 60 percent impaired, limited or restricted   Mobility: Walking and Moving Around Goal Status [id number]) At least 1 percent but less than 20 percent impaired, limited or restricted       Problem List There are no active problems to display for this patient.  Kerman Passey, PT, DPT    06/10/2016, 4:56 PM  [facility] [address], [place], [address] Phone: [phone]   Fax:  [phone]  Name: Shelly [name] MRN: [id number] Date of Birth: 05-05-1961

## 2016-06-12 DIAGNOSIS — M545 Low back pain: Secondary | ICD-10-CM | POA: Diagnosis not present

## 2016-06-12 DIAGNOSIS — J449 Chronic obstructive pulmonary disease, unspecified: Secondary | ICD-10-CM | POA: Diagnosis not present

## 2016-06-12 DIAGNOSIS — R7309 Other abnormal glucose: Secondary | ICD-10-CM | POA: Diagnosis not present

## 2016-06-16 ENCOUNTER — Ambulatory Visit: Payer: Medicare Other | Admitting: Physical Therapy

## 2016-06-16 DIAGNOSIS — M545 Low back pain: Secondary | ICD-10-CM | POA: Diagnosis not present

## 2016-06-16 DIAGNOSIS — R262 Difficulty in walking, not elsewhere classified: Secondary | ICD-10-CM

## 2016-06-16 DIAGNOSIS — G8929 Other chronic pain: Secondary | ICD-10-CM | POA: Diagnosis not present

## 2016-06-16 NOTE — Patient Instructions (Addendum)
Heat and E-stim (Russian, pads on L lumbar flank) - 15.5 mA x 12 minutes   Piriformis stretch, lumbar rotational mob   Prone laying (tried on elbows)   Isometric clamshells in supine, tried in sitting instead

## 2016-06-16 NOTE — Therapy (Signed)
[facility] [address], [place], [address] Phone: [phone]   Fax:  [phone]  Physical Therapy Treatment  Patient Details  Name: Shelly [name] MRN: [id number] Date of Birth: Jul 05, 1960 Referring Provider: [name] FNP  Encounter Date: 06/16/2016      PT End of Session - 06/16/16 0918    Visit Number 2   Number of Visits 13   Date for PT Re-Evaluation 07/29/16   PT Start Time 0907   PT Stop Time 0945   PT Time Calculation (min) 38 min   Activity Tolerance Patient tolerated treatment well;Patient limited by pain   Behavior During Therapy [facility] for tasks assessed/performed      Past Medical History:  Diagnosis Date  . Asthma   . Diabetes mellitus without complication ([place])   . Hypertension     No past surgical history on file.  There were no vitals filed for this visit.      Subjective Assessment - 06/16/16 0915    Subjective Patient reports she has not had much relief since her previous visit. She reports she continues to have pain with flexion to put things in a bath tub. She reports she saw her MD, got medications for pain control as well as depression (which she reports is used to help reduce pain).    Limitations Lifting;Sitting;Walking;House hold activities   Diagnostic tests MRI/X-ray without evidence of fracture or significant disc bulge.    Patient Stated Goals To not have the constant pain anymore.    Currently in Pain? Yes   Pain Location Back   Pain Orientation Left;Lower   Pain Descriptors / Indicators Aching   Pain Type Chronic pain   Pain Onset More than a month ago   Pain Frequency Constant   Aggravating Factors  Lifting, sitting.    Pain Relieving Factors Heat       Heat and E-stim (Russian, pads on L lumbar flank) - 15.5 mA x 12 minutes -- patient reports heat felt good but no real improvements in her symptoms  Piriformis stretch, lumbar rotational mob attempted  both in supine for LLE, she was unable to tolerate either secondary to pain   Prone laying (tried on elbows) - reports she had times in prone where she had no pain, intensified when she was on her elbows.    Isometric clamshells in supine, tried in sitting instead -- x 10 in each, again patient unable to tolerate   On palpation pain appears to be over L PSIS.                            PT Education - 06/16/16 [phone]    Education provided Yes   Education Details Will continue to try to find something to alleviate her symptoms.    Person(s) Educated Patient   Methods Explanation   Comprehension Verbalized understanding             PT Long Term Goals - 06/10/16 1634      PT LONG TERM GOAL #1   Title Patient will report worst pain of less than 5/10 to demonstrate improved tolerance for ADLs.    Baseline 10/10   Time 7   Period Weeks   Status New     PT LONG TERM GOAL #2   Title Patient will report modified ODI of less than 45% disability to demonstrate improved tolerance for ADLs.  Baseline 56%   Time 7   Period Weeks   Status New     PT LONG TERM GOAL #3   Title Patient will demonstrate at least 75% of baseline AROM into flexion/extension in standing with no more than 1/10 increase in pain to demonstrate improved tolerance for ADLs.    Baseline ~25% mobility with severe pain currently.    Time 7   Period Weeks   Status New               Plan - 06/16/16 [id number]    Clinical Impression Statement Patient continues to report high levels of constant pain over L PSIS in this session. She is not responding well to first line PT treatments as all movement appears to aggravate her symptoms, which is not a typical response. Will attempt manual treatments on PSIS and hip flexor stretching/isometric work in next follow up, if no improvement still at that point, will discuss further options with referring MD.    Rehab Potential Fair   Clinical Impairments  Affecting Rehab Potential COPD, history of lower back injury.    PT Frequency 2x / week   PT Duration 6 weeks   PT Treatment/Interventions Aquatic Therapy;Cryotherapy;Electrical Stimulation;Iontophoresis 4mg /ml Dexamethasone;Moist Heat;Traction;Ultrasound;Neuromuscular re-education;Balance training;Therapeutic exercise;Therapeutic activities;Stair training;Gait training;Dry needling;Taping;Manual techniques   PT Next Visit Plan Heat and e-stim.    Consulted and Agree with Plan of Care Patient      Patient will benefit from skilled therapeutic intervention in order to improve the following deficits and impairments:  Abnormal gait, Decreased endurance, Decreased activity tolerance, Pain, Decreased balance, Decreased mobility, Difficulty walking, Increased muscle spasms, Decreased range of motion  Visit Diagnosis: Chronic midline low back pain without sciatica  Difficulty in walking, not elsewhere classified     Problem List There are no active problems to display for this patient.  Kerman Passey, PT, DPT    06/16/2016, 12:39 PM  [facility] [address] [place], [address] Phone: [phone]   Fax:  [phone]  Name: MARLEANA ALMQUIST MRN: [id number] Date of Birth: 1960-11-25

## 2016-06-18 ENCOUNTER — Ambulatory Visit: Payer: Medicare Other | Admitting: Physical Therapy

## 2016-06-18 DIAGNOSIS — G8929 Other chronic pain: Secondary | ICD-10-CM | POA: Diagnosis not present

## 2016-06-18 DIAGNOSIS — M545 Low back pain: Secondary | ICD-10-CM | POA: Diagnosis not present

## 2016-06-18 DIAGNOSIS — R262 Difficulty in walking, not elsewhere classified: Secondary | ICD-10-CM

## 2016-06-18 NOTE — Therapy (Signed)
[place] [facility] [address], [place], [address] Phone: [phone]   Fax:  [phone]  Physical Therapy Treatment  Patient Details  Name: ZARINA RAJEWSKI MRN: [id number] Date of Birth: [birth year] Referring Provider: [name] FNP  Encounter Date: 06/18/2016      PT End of Session - 06/18/16 1111    Visit Number 3   Number of Visits 13   Date for PT Re-Evaluation 07/29/16   PT Start Time 0946   PT Stop Time 1036   PT Time Calculation (min) 50 min   Activity Tolerance Patient limited by pain   Behavior During Therapy [facility] for tasks assessed/performed      Past Medical History:  Diagnosis Date  . Asthma   . Diabetes mellitus without complication ([place])   . Hypertension     No past surgical history on file.  There were no vitals filed for this visit.      Subjective Assessment - 06/18/16 1109    Subjective Patient reports she continues to have high levels of pain in her lower L back, which has been constant since her fall. She has not found any relief, aside from use of hot pack. She continues to report she feels a stabbing sensation in her lower L lumbar area, which feels like a hard knot.    Limitations Lifting;Sitting;Walking;House hold activities   Diagnostic tests MRI/X-ray without evidence of fracture or significant disc bulge.    Patient Stated Goals To not have the constant pain anymore.    Pain Score 8    Pain Location Back   Pain Orientation Lower;Left   Pain Descriptors / Indicators Aching   Pain Type Chronic pain   Pain Onset More than a month ago   Pain Frequency Constant   Aggravating Factors  Lifting, sitting, pressure.    Pain Relieving Factors Heat      Performed soft tissue mobilization over L paraspinals around L1-L3 where she was having significant pain and hyper-algesia over this spot. Attempted trigger point ischemic holds on this area as well, on palpation this appears to  be consistent with hip flexor origin.   Discussed plan of care with patient and referring provider, she is currently not tolerating or responding to any PT modality, and at one point is in tears with trigger point ischemic hold. She was informed PT can attempt ischemic trigger point holds, can also attempt pool therapy (which will be started ASAP).                             PT Education - 06/18/16 1111    Education provided Yes   Education Details Discussed referral options for returning to orthopedist. Can also attempt trigger point release therapy if this is a muscle spasm.    Person(s) Educated Patient   Methods Explanation   Comprehension Verbalized understanding             PT Long Term Goals - 06/10/16 1634      PT LONG TERM GOAL #1   Title Patient will report worst pain of less than 5/10 to demonstrate improved tolerance for ADLs.    Baseline 10/10   Time 7   Period Weeks   Status New     PT LONG TERM GOAL #2   Title Patient will report modified ODI of less than 45% disability to demonstrate improved tolerance for ADLs.    Baseline  56%   Time 7   Period Weeks   Status New     PT LONG TERM GOAL #3   Title Patient will demonstrate at least 75% of baseline AROM into flexion/extension in standing with no more than 1/10 increase in pain to demonstrate improved tolerance for ADLs.    Baseline ~25% mobility with severe pain currently.    Time 7   Period Weeks   Status New               Plan - 06/18/16 1112    Clinical Impression Statement Patient continues to be limited by moderate to severe L lumbar spine pain, which continues to be easily provocated and has not yet been modulated by any therapeutic modality. PT contacted referring provider to discuss further options for this patient, including a referral back to orthopedist. She does have significant sensitivity over L lumbar area, in pinpoint spot which is easily irritated with ischmeic  pressure hold as well as soft tissue mobilization, which may indicate it is musculoskeletal. PT will continue to treat and assess for changes in symptoms observed.    Rehab Potential Fair   Clinical Impairments Affecting Rehab Potential COPD, history of lower back injury.    PT Frequency 2x / week   PT Duration 6 weeks   PT Treatment/Interventions Aquatic Therapy;Cryotherapy;Electrical Stimulation;Iontophoresis 4mg /ml Dexamethasone;Moist Heat;Traction;Ultrasound;Neuromuscular re-education;Balance training;Therapeutic exercise;Therapeutic activities;Stair training;Gait training;Dry needling;Taping;Manual techniques   PT Next Visit Plan Heat and e-stim.    Consulted and Agree with Plan of Care Patient      Patient will benefit from skilled therapeutic intervention in order to improve the following deficits and impairments:  Abnormal gait, Decreased endurance, Decreased activity tolerance, Pain, Decreased balance, Decreased mobility, Difficulty walking, Increased muscle spasms, Decreased range of motion  Visit Diagnosis: Difficulty in walking, not elsewhere classified  Chronic midline low back pain without sciatica     Problem List There are no active problems to display for this patient.  Kerman Passey, PT, DPT    06/18/2016, 11:17 AM  [place] [facility] [address], [place], [address] Phone: [phone]   Fax:  [phone]  Name: CAMIRYN SCHROEN MRN: [id number] Date of Birth: [birth year]

## 2016-06-22 ENCOUNTER — Encounter: Payer: Medicare Other | Admitting: Physical Therapy

## 2016-06-25 ENCOUNTER — Encounter: Payer: Medicare Other | Admitting: Physical Therapy

## 2016-06-29 ENCOUNTER — Encounter: Payer: Medicare Other | Admitting: Physical Therapy

## 2016-07-02 ENCOUNTER — Ambulatory Visit: Payer: Medicare Other | Admitting: Physical Therapy

## 2016-07-06 ENCOUNTER — Ambulatory Visit
Admission: RE | Admit: 2016-07-06 | Discharge: 2016-07-06 | Disposition: A | Discharge status: Home / Self Care | Payer: Medicare Other | Source: Ambulatory Visit | Attending: Nurse Practitioner | Admitting: Nurse Practitioner

## 2016-07-06 ENCOUNTER — Ambulatory Visit: Payer: Medicare Other | Admitting: Physical Therapy

## 2016-07-06 ENCOUNTER — Other Ambulatory Visit: Payer: Self-pay | Admitting: Nurse Practitioner

## 2016-07-06 DIAGNOSIS — R062 Wheezing: Secondary | ICD-10-CM

## 2016-07-06 DIAGNOSIS — R059 Cough, unspecified: Secondary | ICD-10-CM

## 2016-07-06 DIAGNOSIS — R05 Cough: Secondary | ICD-10-CM | POA: Insufficient documentation

## 2016-07-07 DIAGNOSIS — J9611 Chronic respiratory failure with hypoxia: Secondary | ICD-10-CM | POA: Diagnosis not present

## 2016-07-07 DIAGNOSIS — G4733 Obstructive sleep apnea (adult) (pediatric): Secondary | ICD-10-CM | POA: Diagnosis not present

## 2016-07-07 DIAGNOSIS — J441 Chronic obstructive pulmonary disease with (acute) exacerbation: Secondary | ICD-10-CM | POA: Diagnosis not present

## 2016-07-09 ENCOUNTER — Ambulatory Visit: Payer: Medicare Other | Admitting: Physical Therapy

## 2016-07-13 ENCOUNTER — Ambulatory Visit: Payer: Medicare Other | Admitting: Physical Therapy

## 2016-07-16 ENCOUNTER — Ambulatory Visit: Payer: Medicare Other | Admitting: Physical Therapy

## 2016-07-20 ENCOUNTER — Ambulatory Visit: Payer: Medicare Other | Attending: Primary Care | Admitting: Physical Therapy

## 2016-07-20 DIAGNOSIS — R262 Difficulty in walking, not elsewhere classified: Secondary | ICD-10-CM | POA: Insufficient documentation

## 2016-07-20 DIAGNOSIS — M545 Low back pain: Secondary | ICD-10-CM | POA: Insufficient documentation

## 2016-07-20 DIAGNOSIS — G8929 Other chronic pain: Secondary | ICD-10-CM | POA: Insufficient documentation

## 2016-07-21 ENCOUNTER — Ambulatory Visit: Payer: Medicare Other | Admitting: Physical Therapy

## 2016-07-23 ENCOUNTER — Ambulatory Visit: Payer: Medicare Other | Admitting: Physical Therapy

## 2016-07-27 ENCOUNTER — Ambulatory Visit: Payer: Medicare Other | Admitting: Physical Therapy

## 2016-07-27 ENCOUNTER — Telehealth: Payer: Self-pay | Admitting: Physical Therapy

## 2016-07-27 NOTE — Telephone Encounter (Signed)
Primary therapist has attempted to reach pulmonologist Dr. Humphrey Rolls multiple times, unable to get in touch with clinic, thus called [name] this date and left message regarding aquatic PTs concerns of having patient in the pool with COPD.   PT also called patient as she was not present for prior visit, reached answering machine but voicemail box was full. Will re-attempt as able.   Royce Macadamia PT, DPT, CSCS

## 2016-07-28 ENCOUNTER — Telehealth: Payer: Self-pay | Admitting: Physical Therapy

## 2016-07-28 NOTE — Telephone Encounter (Signed)
Attempted to contact patient regarding appointment in the pool on Thursday. No answer at listed number and no voicemail was set up. Will re-try as able.   Royce Macadamia PT, DPT, CSCS

## 2016-07-30 ENCOUNTER — Ambulatory Visit: Payer: Medicare Other | Admitting: Physical Therapy

## 2016-07-30 DIAGNOSIS — M545 Low back pain: Secondary | ICD-10-CM

## 2016-07-30 DIAGNOSIS — R262 Difficulty in walking, not elsewhere classified: Secondary | ICD-10-CM | POA: Diagnosis not present

## 2016-07-30 DIAGNOSIS — G8929 Other chronic pain: Secondary | ICD-10-CM | POA: Diagnosis not present

## 2016-07-31 NOTE — Therapy (Addendum)
[place] MAIN [facility] SERVICES [address] [place], [place], [address] Phone: [phone]   Fax:  [phone]  Physical Therapy Treatment  Patient Details  Name: Shelly [name] MRN: [id number] Date of Birth: [birth year] Referring Provider: [name] FNP  Encounter Date: 07/30/2016      PT End of Session - 07/31/16 2012    Visit Number 4   Number of Visits 13   Date for PT Re-Evaluation 10/15/16   PT Start Time 1115   PT Stop Time 1200   PT Time Calculation (min) 45 min   Activity Tolerance Patient tolerated treatment well;No increased pain   Behavior During Therapy WFL for tasks assessed/performed      Past Medical History:  Diagnosis Date  . Asthma   . Diabetes mellitus without complication ([place])   . Hypertension     No past surgical history on file.  There were no vitals filed for this visit.      Subjective Assessment - 07/31/16 2011    Subjective Pt reported her back pain radiates down lateral thigh to knee level on L.    Limitations Lifting;Sitting;Walking;House hold activities   Diagnostic tests MRI/X-ray without evidence of fracture or significant disc bulge.    Patient Stated Goals To not have the constant pain anymore.    Pain Onset More than a month ago                     Adult Aquatic Therapy - 07/31/16 2012      Aquatic Therapy Subjective   Subjective Pt tolerated aquatic session without complaints nor SOB        O: Pt entered/exited the pool via ramp with single UE support on rail.  50 ft =1 lap  Exercises performed in 3'6" depth  Stretches : (Cuing provided for proper alignment) ROM of each joint wall stretch Mini squat-figure 4 (piriformis) hip flexor stretch on step hip flexor twist on step   sidestepping 10 steps with cues for propioception for wider feet BOS  seated on noodles sidestepping    Relaxation semi reclined on noodles, PT support                    PT Long Term Goals - 07/31/16 2018      PT LONG TERM GOAL #1   Title Patient will report worst pain of less than 5/10 to demonstrate improved tolerance for ADLs.    Baseline 10/10   Time 7   Period Weeks   Status On-going     PT LONG TERM GOAL #2   Title Patient will report modified ODI of less than 45% disability to demonstrate improved tolerance for ADLs.    Baseline 56%   Time 7   Period Weeks   Status On-going     PT LONG TERM GOAL #3   Title Patient will demonstrate at least 75% of baseline AROM into flexion/extension in standing with no more than 1/10 increase in pain to demonstrate improved tolerance for ADLs.    Baseline ~25% mobility with severe pain currently.    Time 7   Period Weeks   Status On-going     PT LONG TERM GOAL #4   Title Pt will report centralization of her pain at her low back without radiation down her lateral thigh across 2 or more aquatic visits in order to continue tolerate PT Tx and to decrease pain  for increased returen to ADLs.  Time 12   Period Weeks   Status New               Plan - 07/31/16 2013    Clinical Impression Statement  Pt has been cleared by her PCP for aquatic Tx. Pt tolerated session today without SOB nor needing her inhaler.  Following today's aquatic Tx, pt reported her L radiating pain had moved from above her knee to her low back which is a positive sign of improvement. Focused on stretches and specific movement patterns to relieve glut, back, and pelvic floor mm tightness. Pt will benefit from additional ther ex at next session to release her midback mm to decrease thoracic kyphosis which will in turn have a positive impact on her COPD and pulmonary issues and back issues. Pt will also continue to benefit from a routine involving low endurance with rest breaks and ther ex in supine positions to create relaxation of her spinal mm and less load on her spine. Initiated Psychologist, educational, pain science education, and  reinforcement of positive self-talk because pt presented with psychosocial barriers (i.e.expressed frustration re: her pain and disability).   Pt will be scheduled for more aquatic Tx and also will benefit from working with pelvic health specialist who has training in the use of pelvic health and biopsychosocial approaches to address her pain and goals.    Rehab Potential Fair   Clinical Impairments Affecting Rehab Potential COPD, history of lower back injury.    PT Frequency 2x / week   PT Duration 6 weeks   PT Treatment/Interventions Aquatic Therapy;Cryotherapy;Electrical Stimulation;Iontophoresis 4mg /ml Dexamethasone;Moist Heat;Traction;Ultrasound;Neuromuscular re-education;Balance training;Therapeutic exercise;Therapeutic activities;Stair training;Gait training;Dry needling;Taping;Manual techniques   PT Next Visit Plan Heat and e-stim.    Consulted and Agree with Plan of Care Patient      Patient will benefit from skilled therapeutic intervention in order to improve the following deficits and impairments:  Abnormal gait, Decreased endurance, Decreased activity tolerance, Pain, Decreased balance, Decreased mobility, Difficulty walking, Increased muscle spasms, Decreased range of motion  Visit Diagnosis: Difficulty in walking, not elsewhere classified  Chronic midline low back pain without sciatica     Problem List There are no active problems to display for this patient.   Jerl Mina ,PT, DPT, E-RYT  07/31/2016, 8:26 PM  [place] MAIN [facility] SERVICES [address] [place], [place], [address] Phone: [phone]   Fax:  [phone]  Name: Shelly [name] MRN: [id number] Date of Birth: August 26, 1960

## 2016-08-03 ENCOUNTER — Ambulatory Visit: Payer: Medicare Other | Admitting: Physical Therapy

## 2016-08-06 ENCOUNTER — Ambulatory Visit: Payer: Medicare Other | Attending: Primary Care | Admitting: Physical Therapy

## 2016-08-06 DIAGNOSIS — R262 Difficulty in walking, not elsewhere classified: Secondary | ICD-10-CM | POA: Insufficient documentation

## 2016-08-06 DIAGNOSIS — M545 Low back pain, unspecified: Secondary | ICD-10-CM

## 2016-08-06 DIAGNOSIS — G8929 Other chronic pain: Secondary | ICD-10-CM | POA: Insufficient documentation

## 2016-08-06 NOTE — Therapy (Signed)
[place] MAIN [facility] SERVICES [address] [place], [place], [address] Phone: [phone]   Fax:  [phone]  Physical Therapy Treatment  Patient Details  Name: Shelly [name] MRN: [id number] Date of Birth: Jun 13, 1960 Referring Provider: [name] FNP  Encounter Date: 08/06/2016      PT End of Session - 08/06/16 1243    Visit Number 5   Number of Visits 13   Date for PT Re-Evaluation 10/15/16   PT Start Time 1120   PT Stop Time 1150   PT Time Calculation (min) 30 min   Activity Tolerance Patient tolerated treatment well;No increased pain   Behavior During Therapy WFL for tasks assessed/performed      Past Medical History:  Diagnosis Date  . Asthma   . Diabetes mellitus without complication ([place])   . Hypertension     No past surgical history on file.  There were no vitals filed for this visit.      Subjective Assessment - 08/06/16 1240    Subjective Pt reported her radiating back pain had not returned since last pool session. Pt's remaining back pain is 7/10 and located in the low back area on the L , and feels "like someone is stabbing me" and "like a knot" .    Limitations Lifting;Sitting;Walking;House hold activities   Diagnostic tests MRI/X-ray without evidence of fracture or significant disc bulge.    Patient Stated Goals To not have the constant pain anymore.    Pain Onset More than a month ago                     Adult Aquatic Therapy - 08/06/16 1247      Aquatic Therapy Subjective   Subjective Pt tolerated session today but was tearful when pain returned with L trunk rotation ~30 deg. Pt was guided to a L back stretch which decreased pain.  Pt was reinforced about her improvement and provided a towel to wipe tears.        O: Pt entered/exited the pool via steps with single UE support on rail.  50 ft =1 lap  Exercises performed in 3'6" depth  Stretches : (Cuing provided for proper  alignment)  hip flexor stretch on step hip flexor twist on step  Trunk rotation to L ~30 deg caused pain, pt tearful. Immediately guided pt to stretch which decreased pain.   Introduced  wall stretch 3-way  hip abd/ER/adduction/IR with [place] on floor to decrease her c/o  tightness in gluts Semi tandem stance L /R in simulated dishwashing task , cued for alignment and propioception  30 sec x2 semi reclined noodles, BUE/LE abd/add , Cued for cervical ext to increase lung capacity  Relaxation semi reclined on noodles, PT support. Drag for lumbar traction 2'                   PT Long Term Goals - 07/31/16 2018      PT LONG TERM GOAL #1   Title Patient will report worst pain of less than 5/10 to demonstrate improved tolerance for ADLs.    Baseline 10/10   Time 7   Period Weeks   Status On-going     PT LONG TERM GOAL #2   Title Patient will report modified ODI of less than 45% disability to demonstrate improved tolerance for ADLs.    Baseline 56%   Time 7   Period Weeks   Status On-going     PT  LONG TERM GOAL #3   Title Patient will demonstrate at least 75% of baseline AROM into flexion/extension in standing with no more than 1/10 increase in pain to demonstrate improved tolerance for ADLs.    Baseline ~25% mobility with severe pain currently.    Time 7   Period Weeks   Status On-going     PT LONG TERM GOAL #4   Title Pt will report centralization of her pain at her low back without radiation down her lateral thigh across 2 or more aquatic visits in order to continue tolerate PT Tx and to decrease pain  for increased returen to ADLs.   Time 12   Period Weeks   Status New               Plan - 08/06/16 1243    Clinical Impression Statement Pt tolerated pool session again today without complaints nor issues. Pt has not had any radiating pain for the past week. Pt was educated on body mechanics with standing when washing dishing/cooking to offload her  spine. Pt demo'd correctly. Pt was also shown new stretches to decrease mm tightness on her L side. Pt reported LBP returning when walking. Pt will continue to benefit from skilled PT.    Rehab Potential Fair   Clinical Impairments Affecting Rehab Potential COPD, history of lower back injury.    PT Frequency 2x / week   PT Duration 12 weeks   PT Treatment/Interventions Aquatic Therapy;Cryotherapy;Electrical Stimulation;Iontophoresis 4mg /ml Dexamethasone;Moist Heat;Traction;Ultrasound;Neuromuscular re-education;Balance training;Therapeutic exercise;Therapeutic activities;Stair training;Gait training;Dry needling;Taping;Manual techniques   PT Next Visit Plan Heat and e-stim.    Consulted and Agree with Plan of Care Patient      Patient will benefit from skilled therapeutic intervention in order to improve the following deficits and impairments:  Abnormal gait, Decreased endurance, Decreased activity tolerance, Pain, Decreased balance, Decreased mobility, Difficulty walking, Increased muscle spasms, Decreased range of motion  Visit Diagnosis: Difficulty in walking, not elsewhere classified  Chronic midline low back pain without sciatica     Problem List There are no active problems to display for this patient.   Jerl Mina ,PT, DPT, E-RYT  08/06/2016, 12:49 PM  [place] MAIN [facility] SERVICES [address] [place], [place], [address] Phone: [phone]   Fax:  [phone]  Name: YAITZA LOR MRN: [id number] Date of Birth: 1960-08-01

## 2016-08-07 ENCOUNTER — Ambulatory Visit: Payer: Medicare Other | Admitting: Physical Therapy

## 2016-08-07 DIAGNOSIS — R262 Difficulty in walking, not elsewhere classified: Secondary | ICD-10-CM

## 2016-08-07 DIAGNOSIS — M545 Low back pain, unspecified: Secondary | ICD-10-CM

## 2016-08-07 DIAGNOSIS — G8929 Other chronic pain: Secondary | ICD-10-CM | POA: Diagnosis not present

## 2016-08-07 NOTE — Therapy (Signed)
[place] MAIN [facility] SERVICES [address] [place], [place], [address] Phone: [phone]   Fax:  [phone]  Physical Therapy Treatment  Patient Details  Name: Shelly [name] MRN: [id number] Date of Birth: [birth year] Referring Provider: [name] FNP  Encounter Date: 08/07/2016      PT End of Session - 08/07/16 2136    Visit Number 6   Number of Visits 13   Date for PT Re-Evaluation 10/15/16   PT Start Time 1050   PT Stop Time 1150   PT Time Calculation (min) 60 min   Activity Tolerance Patient tolerated treatment well;No increased pain   Behavior During Therapy WFL for tasks assessed/performed      Past Medical History:  Diagnosis Date  . Asthma   . Diabetes mellitus without complication ([place])   . Hypertension     No past surgical history on file.  There were no vitals filed for this visit.      Subjective Assessment - 08/07/16 1055    Subjective (P)  Pt reported no radiating pain down her legs but the remaining pain is located along the low back that feels like pressure and spreads across the low back.    Limitations (P)  Lifting;Sitting;Walking;House hold activities   Diagnostic tests (P)  MRI/X-ray without evidence of fracture or significant disc bulge.    Patient Stated Goals (P)  To not have the constant pain anymore.    Pain Onset (P)  More than a month ago            [facility] PT Assessment - 08/07/16 2133      Palpation   Spinal mobility significant mm tensions on L midback paraspinals > R    SI assessment  L thoracolumbar scoliosis    Palpation comment abdominal scar mobile                      [facility] Adult PT Treatment/Exercise - 08/07/16 2135      Therapeutic Activites    Therapeutic Activities --  see pt instructions     Manual Therapy   Manual therapy comments STM, MWM along thoracic mm L paraspinals , scapular mm                PT Education - 08/07/16 2136    Education  provided Yes   Education Details HEP   Person(s) Educated Patient   Methods Explanation;Demonstration;Tactile cues;Verbal cues;Handout   Comprehension Returned demonstration;Verbalized understanding             PT Long Term Goals - 07/31/16 2018      PT LONG TERM GOAL #1   Title Patient will report worst pain of less than 5/10 to demonstrate improved tolerance for ADLs.    Baseline 10/10   Time 7   Period Weeks   Status On-going     PT LONG TERM GOAL #2   Title Patient will report modified ODI of less than 45% disability to demonstrate improved tolerance for ADLs.    Baseline 56%   Time 7   Period Weeks   Status On-going     PT LONG TERM GOAL #3   Title Patient will demonstrate at least 75% of baseline AROM into flexion/extension in standing with no more than 1/10 increase in pain to demonstrate improved tolerance for ADLs.    Baseline ~25% mobility with severe pain currently.    Time 7   Period Weeks   Status On-going  PT LONG TERM GOAL #4   Title Pt will report centralization of her pain at her low back without radiation down her lateral thigh across 2 or more aquatic visits in order to continue tolerate PT Tx and to decrease pain  for increased returen to ADLs.   Time 12   Period Weeks   Status New               Plan - 08/07/16 2136    Clinical Impression Statement Pt demo'd L thoracolumbar scoliotic spine with increased mm tensions along L thoracic paraspinal mm.  Pt demo'd decreased tensions and tenderness following Tx which she tolerated without complaints. Initated deep core strengthening in supine (head/chest elevated with pillows due to pulmonary issues). Pt demo'd IND with HEP. Pt will continue to benefit from skilled PT with customized approaches to address mm and spinal imbalances related to scoliosis.     Rehab Potential Fair   Clinical Impairments Affecting Rehab Potential COPD, history of lower back injury.    PT Frequency 2x / week   PT  Duration 12 weeks   PT Treatment/Interventions Aquatic Therapy;Cryotherapy;Electrical Stimulation;Iontophoresis 4mg /ml Dexamethasone;Moist Heat;Traction;Ultrasound;Neuromuscular re-education;Balance training;Therapeutic exercise;Therapeutic activities;Stair training;Gait training;Dry needling;Taping;Manual techniques   PT Next Visit Plan Heat and e-stim.    Consulted and Agree with Plan of Care Patient      Patient will benefit from skilled therapeutic intervention in order to improve the following deficits and impairments:  Abnormal gait, Decreased endurance, Decreased activity tolerance, Pain, Decreased balance, Decreased mobility, Difficulty walking, Increased muscle spasms, Decreased range of motion  Visit Diagnosis: Difficulty in walking, not elsewhere classified  Chronic midline low back pain without sciatica     Problem List There are no active problems to display for this patient.   Jerl Mina ,PT, DPT, E-RYT  08/07/2016, 9:40 PM  [place] MAIN [facility] SERVICES [address] [place], [place], [address] Phone: [phone]   Fax:  [phone]  Name: Shelly [name] MRN: [id number] Date of Birth: 1960/08/13

## 2016-08-07 NOTE — Patient Instructions (Signed)
You are now ready to begin training the deep core muscles system: diaphragm, transverse abdominis, pelvic floor . These muscles must work together as a team.           The key to these exercises to train the brain to coordinate the timing of these muscles and to have them turn on for long periods of time to hold you upright against gravity (especially important if you are on your feet all day).These muscles are postural muscles and play a role stabilizing your spine and bodyweight. By doing these repetitions slowly and correctly instead of doing crunches, you will achieve a flatter belly without a lower pooch. You are also placing your spine in a more neutral position and breathing properly which in turn, decreases your risk for problems related to your pelvic floor, abdominal, and low back such as pelvic organ prolapse, hernias, diastasis recti (separation of superficial muscles), disk herniations, spinal fractures. These exercises set a solid foundation for you to later progress to resistance/ strength training with therabands and weights and return to other typical fitness exercises with a stronger deeper core.   Do level 1 : 10 reps Do level 2: 10 reps (left and right = 1 rep) x 3 sets , 2 x day Do not progress to level 3 for 3-4 weeks. You know you are ready when you do not have any rocking of pelvis nor arching in your back     Open book (handout)

## 2016-08-14 ENCOUNTER — Ambulatory Visit: Payer: Medicare Other | Admitting: Physical Therapy

## 2016-08-14 DIAGNOSIS — M545 Low back pain: Secondary | ICD-10-CM

## 2016-08-14 DIAGNOSIS — G8929 Other chronic pain: Secondary | ICD-10-CM | POA: Diagnosis not present

## 2016-08-14 DIAGNOSIS — R262 Difficulty in walking, not elsewhere classified: Secondary | ICD-10-CM | POA: Diagnosis not present

## 2016-08-14 NOTE — Therapy (Signed)
[place] MAIN [facility] SERVICES [address] [place], [place], [address] Phone: [phone]   Fax:  [phone]  Physical Therapy Treatment  Patient Details  Name: Shelly [name] MRN: [id number] Date of Birth: 1961/02/01 Referring Provider: [name] FNP  Encounter Date: 08/14/2016      PT End of Session - 08/14/16 1451    Visit Number 7   Number of Visits 13   Date for PT Re-Evaluation 10/15/16   PT Start Time 6389   PT Stop Time 1500   PT Time Calculation (min) 45 min   Activity Tolerance Patient tolerated treatment well;No increased pain   Behavior During Therapy WFL for tasks assessed/performed      Past Medical History:  Diagnosis Date  . Asthma   . Diabetes mellitus without complication ([place])   . Hypertension     No past surgical history on file.  There were no vitals filed for this visit.      Subjective Assessment - 08/14/16 1455    Subjective Pt reported no radiating pain down her L lateral thigh for the past 2 weeks. Pt also reproted feeling sore all day following last session but heat help to relieve it. Pt continues with HEP            [facility] PT Assessment - 08/14/16 1448      Palpation   SI assessment  decreased midback tensions.  R PSIS more posterior, tenderness to palpation, tensions noted along sacrospinous ligament, (decreased tensions./tenderness post Tx,. PSIS more equal post Tx                     [facility] Adult PT Treatment/Exercise - 08/14/16 1450      Therapeutic Activites    Therapeutic Activities --  deep core level 2 30 reps, ~5-10 deg of hip abd/add      Modalities   Modalities --  moist pack, 5 min under sacrum, skin intact post Tx     Manual Therapy   Manual therapy comments inferior/superior glide of sacrum, grade I-II , STM along R tender/tensions along sacrum , MWM                       PT Long Term Goals - 08/14/16 1453      PT LONG TERM GOAL #1   Title Patient will report worst pain of less than 5/10 to demonstrate improved tolerance for ADLs.    Baseline 10/10   Time 7   Period Weeks   Status On-going     PT LONG TERM GOAL #2   Title Patient will report modified ODI of less than 45% disability to demonstrate improved tolerance for ADLs.    Baseline 56%   Time 7   Period Weeks   Status On-going     PT LONG TERM GOAL #3   Title Patient will demonstrate at least 75% of baseline AROM into flexion/extension in standing with no more than 1/10 increase in pain to demonstrate improved tolerance for ADLs.    Baseline ~25% mobility with severe pain currently.    Time 7   Period Weeks   Status On-going     PT LONG TERM GOAL #4   Title Pt will report centralization of her pain at her low back without radiation down her lateral thigh across 2 visits in order to continue tolerate PT Tx and to decrease pain  for increased returen to ADLs.   Time 12  Period Weeks   Status Achieved     PT LONG TERM GOAL #5   Title Pt will demo will no pelvic obliquities across 2 visits and no report of tenderness to palaption at R PSIS in seating position in order to progress to hip/ back strengthening exercises to decrease pain    Time 12   Period Weeks   Status New               Plan - 08/14/16 1452    Clinical Impression Statement Pt showed decreased hypomobility at thoracic spine since last session and reports no more radiating pain down L lateral thigh for the past 2 weeks. Addressed pelvic obliquities today with manual Tx. Pt reported decreased tenderness/showed decreased tensions along R distal sacral edge postTx. Educated pt on proper mechanics with getting out bed and sit to stand to minimize back strain.  Pt continues to benefit from skilled PT.    Rehab Potential Fair   Clinical Impairments Affecting Rehab Potential COPD, history of lower back injury.    PT Frequency 2x / week   PT Duration 12 weeks   PT Treatment/Interventions  Aquatic Therapy;Cryotherapy;Electrical Stimulation;Iontophoresis 4mg /ml Dexamethasone;Moist Heat;Traction;Ultrasound;Neuromuscular re-education;Balance training;Therapeutic exercise;Therapeutic activities;Stair training;Gait training;Dry needling;Taping;Manual techniques   PT Next Visit Plan     Consulted and Agree with Plan of Care Patient      Patient will benefit from skilled therapeutic intervention in order to improve the following deficits and impairments:  Abnormal gait, Decreased endurance, Decreased activity tolerance, Pain, Decreased balance, Decreased mobility, Difficulty walking, Increased muscle spasms, Decreased range of motion  Visit Diagnosis: Difficulty in walking, not elsewhere classified  Chronic midline low back pain without sciatica     Problem List There are no active problems to display for this patient.   Jerl Mina ,PT, DPT, E-RYT  08/14/2016, 2:57 PM  [place] MAIN [facility] SERVICES [address] [place], [place], [address] Phone: [phone]   Fax:  [phone]  Name: Shelly [name] MRN: [id number] Date of Birth: 03-Dec-1960

## 2016-08-14 NOTE — Patient Instructions (Signed)
  Proper body mechanics with getting out of a chair to decrease strain  on back &pelvic floor   Avoid holding your breath when Getting out of the chair:  Scoot to front part of chair  Heels behind feet, feet are hip width apart, nose over toes  Inhale like you are smelling roses Exhale to stand     _________  Log roll Log rolling into .bed   Hook R leg under L and then prop on forearms to get into the bed then roll on back       Log rolling out of .bed   R arm overhead  Raise hips and scoot hips to L   Drop knees to R,  scooting R shoulder back to get completely on your R side so your shoulders, hips, and knees point to the R    Then breathe as you drop feet off bed and prop onto R elbow and  use both hands to push yourself

## 2016-08-20 ENCOUNTER — Ambulatory Visit: Payer: Medicare Other | Admitting: Physical Therapy

## 2016-09-03 DIAGNOSIS — J441 Chronic obstructive pulmonary disease with (acute) exacerbation: Secondary | ICD-10-CM | POA: Diagnosis not present

## 2016-09-04 ENCOUNTER — Ambulatory Visit: Payer: Medicare Other | Admitting: Physical Therapy

## 2016-09-07 ENCOUNTER — Inpatient Hospital Stay: Payer: Medicare Other

## 2016-09-07 ENCOUNTER — Encounter: Payer: Self-pay | Admitting: Internal Medicine

## 2016-09-07 ENCOUNTER — Inpatient Hospital Stay
Admission: AD | Admit: 2016-09-07 | Discharge: 2016-09-09 | DRG: 190 | Disposition: A | Discharge status: Home / Self Care | Payer: Medicare Other | Source: Ambulatory Visit | Attending: Internal Medicine | Admitting: Internal Medicine

## 2016-09-07 DIAGNOSIS — R0902 Hypoxemia: Secondary | ICD-10-CM

## 2016-09-07 DIAGNOSIS — Z7952 Long term (current) use of systemic steroids: Secondary | ICD-10-CM

## 2016-09-07 DIAGNOSIS — I10 Essential (primary) hypertension: Secondary | ICD-10-CM | POA: Diagnosis present

## 2016-09-07 DIAGNOSIS — J9621 Acute and chronic respiratory failure with hypoxia: Secondary | ICD-10-CM | POA: Diagnosis present

## 2016-09-07 DIAGNOSIS — R0602 Shortness of breath: Secondary | ICD-10-CM | POA: Diagnosis not present

## 2016-09-07 DIAGNOSIS — E119 Type 2 diabetes mellitus without complications: Secondary | ICD-10-CM

## 2016-09-07 DIAGNOSIS — G4733 Obstructive sleep apnea (adult) (pediatric): Secondary | ICD-10-CM | POA: Diagnosis not present

## 2016-09-07 DIAGNOSIS — Z87891 Personal history of nicotine dependence: Secondary | ICD-10-CM

## 2016-09-07 DIAGNOSIS — J101 Influenza due to other identified influenza virus with other respiratory manifestations: Secondary | ICD-10-CM | POA: Diagnosis present

## 2016-09-07 DIAGNOSIS — J441 Chronic obstructive pulmonary disease with (acute) exacerbation: Secondary | ICD-10-CM | POA: Diagnosis present

## 2016-09-07 DIAGNOSIS — R05 Cough: Secondary | ICD-10-CM | POA: Diagnosis not present

## 2016-09-07 DIAGNOSIS — J9611 Chronic respiratory failure with hypoxia: Secondary | ICD-10-CM | POA: Diagnosis not present

## 2016-09-07 LAB — GLUCOSE, CAPILLARY
Glucose-Capillary: 101 mg/dL — ABNORMAL HIGH (ref 65–99)
Glucose-Capillary: 217 mg/dL — ABNORMAL HIGH (ref 65–99)
Glucose-Capillary: 227 mg/dL — ABNORMAL HIGH (ref 65–99)

## 2016-09-07 LAB — COMPREHENSIVE METABOLIC PANEL
ALT: 19 U/L (ref 14–54)
ANION GAP: 8 (ref 5–15)
AST: 32 U/L (ref 15–41)
Albumin: 3.3 g/dL — ABNORMAL LOW (ref 3.5–5.0)
Alkaline Phosphatase: 42 U/L (ref 38–126)
BILIRUBIN TOTAL: 0.4 mg/dL (ref 0.3–1.2)
BUN: 18 mg/dL (ref 6–20)
CO2: 30 mmol/L (ref 22–32)
CREATININE: 1.2 mg/dL — AB (ref 0.44–1.00)
Calcium: 8.3 mg/dL — ABNORMAL LOW (ref 8.9–10.3)
Chloride: 96 mmol/L — ABNORMAL LOW (ref 101–111)
GFR calc Af Amer: 58 mL/min — ABNORMAL LOW (ref >60)
GFR, EST NON AFRICAN AMERICAN: 50 mL/min — AB (ref >60)
GLUCOSE: 181 mg/dL — AB (ref 65–99)
POTASSIUM: 3.5 mmol/L (ref 3.5–5.1)
Sodium: 134 mmol/L — ABNORMAL LOW (ref 135–145)
Total Protein: 6.3 g/dL — ABNORMAL LOW (ref 6.5–8.1)

## 2016-09-07 LAB — CBC WITH DIFFERENTIAL/PLATELET
BASOS PCT: 1 %
Basophils Absolute: 0.1 10*3/uL (ref 0–0.1)
EOS ABS: 0 10*3/uL (ref 0–0.7)
EOS PCT: 1 %
HCT: 40.4 % (ref 35.0–47.0)
Hemoglobin: 13.7 g/dL (ref 12.0–16.0)
LYMPHS ABS: 2.1 10*3/uL (ref 1.0–3.6)
Lymphocytes Relative: 34 %
MCH: 29.6 pg (ref 26.0–34.0)
MCHC: 33.9 g/dL (ref 32.0–36.0)
MCV: 87.4 fL (ref 80.0–100.0)
MONO ABS: 0.6 10*3/uL (ref 0.2–0.9)
MONOS PCT: 10 %
Neutro Abs: 3.4 10*3/uL (ref 1.4–6.5)
Neutrophils Relative %: 54 %
Platelets: 174 10*3/uL (ref 150–440)
RBC: 4.62 MIL/uL (ref 3.80–5.20)
RDW: 13.5 % (ref 11.5–14.5)
WBC: 6.2 10*3/uL (ref 3.6–11.0)

## 2016-09-07 LAB — BRAIN NATRIURETIC PEPTIDE: B Natriuretic Peptide: 23 pg/mL (ref 0.0–100.0)

## 2016-09-07 LAB — INFLUENZA PANEL BY PCR (TYPE A & B)
Influenza A By PCR: NEGATIVE
Influenza B By PCR: POSITIVE — AB

## 2016-09-07 MED ORDER — METHYLPREDNISOLONE SODIUM SUCC 125 MG IJ SOLR
60.0000 mg | INTRAMUSCULAR | Status: DC
  Administered 2016-09-07: 17:00:00 60 mg via INTRAVENOUS
  Filled 2016-09-07: qty 2

## 2016-09-07 MED ORDER — ACETAMINOPHEN 650 MG RE SUPP
650.0000 mg | Freq: Four times a day (QID) | RECTAL | Status: DC | PRN
Start: 2016-09-07 — End: 2016-09-09

## 2016-09-07 MED ORDER — LEVOFLOXACIN IN D5W 750 MG/150ML IV SOLN
750.0000 mg | INTRAVENOUS | Status: DC
  Administered 2016-09-07: 750 mg via INTRAVENOUS
  Filled 2016-09-07 (×2): qty 150

## 2016-09-07 MED ORDER — BUTALBITAL-APAP-CAFFEINE 50-325-40 MG PO TABS: 1.0000 | ORAL_TABLET | Freq: Four times a day (QID) | ORAL | Status: DC | PRN

## 2016-09-07 MED ORDER — SODIUM CHLORIDE 0.9% FLUSH: 3.0000 mL | INTRAVENOUS | Status: DC | PRN

## 2016-09-07 MED ORDER — INSULIN ASPART 100 UNIT/ML ~~LOC~~ SOLN
0.0000 [IU] | Freq: Three times a day (TID) | SUBCUTANEOUS | Status: DC
  Administered 2016-09-08: 3 [IU] via SUBCUTANEOUS
  Administered 2016-09-08 (×2): 2 [IU] via SUBCUTANEOUS
  Filled 2016-09-07: qty 2
  Filled 2016-09-07: qty 3
  Filled 2016-09-07: qty 2
  Filled 2016-09-07: qty 3

## 2016-09-07 MED ORDER — SODIUM CHLORIDE 0.9% FLUSH
3.0000 mL | Freq: Two times a day (BID) | INTRAVENOUS | Status: DC
  Administered 2016-09-07 – 2016-09-08 (×4): 3 mL via INTRAVENOUS

## 2016-09-07 MED ORDER — ORAL CARE MOUTH RINSE
15.0000 mL | Freq: Two times a day (BID) | OROMUCOSAL | Status: DC
  Administered 2016-09-08: 14:00:00 15 mL via OROMUCOSAL

## 2016-09-07 MED ORDER — IPRATROPIUM-ALBUTEROL 0.5-2.5 (3) MG/3ML IN SOLN
3.0000 mL | Freq: Four times a day (QID) | RESPIRATORY_TRACT | Status: DC
  Administered 2016-09-07 – 2016-09-09 (×8): 3 mL via RESPIRATORY_TRACT
  Filled 2016-09-07 (×7): qty 3

## 2016-09-07 MED ORDER — CHLORHEXIDINE GLUCONATE 0.12 % MT SOLN
15.0000 mL | Freq: Two times a day (BID) | OROMUCOSAL | Status: DC
  Administered 2016-09-07 – 2016-09-08 (×3): 15 mL via OROMUCOSAL
  Filled 2016-09-07 (×3): qty 15

## 2016-09-07 MED ORDER — INSULIN ASPART 100 UNIT/ML ~~LOC~~ SOLN
0.0000 [IU] | Freq: Every day | SUBCUTANEOUS | Status: DC
  Administered 2016-09-07: 2 [IU] via SUBCUTANEOUS
  Filled 2016-09-07: qty 2

## 2016-09-07 MED ORDER — POLYETHYLENE GLYCOL 3350 17 G PO PACK: 17.0000 g | PACK | Freq: Every day | ORAL | Status: DC | PRN

## 2016-09-07 MED ORDER — FLUTICASONE FUROATE-VILANTEROL 200-25 MCG/INH IN AEPB
1.0000 | INHALATION_SPRAY | Freq: Every day | RESPIRATORY_TRACT | Status: DC
  Administered 2016-09-07 – 2016-09-08 (×2): 1 via RESPIRATORY_TRACT
  Filled 2016-09-07: qty 28

## 2016-09-07 MED ORDER — ALBUTEROL SULFATE (2.5 MG/3ML) 0.083% IN NEBU: 2.5000 mg | INHALATION_SOLUTION | RESPIRATORY_TRACT | Status: DC | PRN

## 2016-09-07 MED ORDER — HYDROCODONE-ACETAMINOPHEN 5-325 MG PO TABS
1.0000 | ORAL_TABLET | ORAL | Status: DC | PRN
Start: 2016-09-07 — End: 2016-09-08
  Administered 2016-09-07 – 2016-09-08 (×3): 1 via ORAL
  Filled 2016-09-07 (×3): qty 1

## 2016-09-07 MED ORDER — ACETAMINOPHEN 325 MG PO TABS
650.0000 mg | ORAL_TABLET | Freq: Four times a day (QID) | ORAL | Status: DC | PRN
  Administered 2016-09-08: 20:00:00 650 mg via ORAL
  Filled 2016-09-07: qty 2

## 2016-09-07 MED ORDER — ENOXAPARIN SODIUM 40 MG/0.4ML ~~LOC~~ SOLN
40.0000 mg | SUBCUTANEOUS | Status: DC
  Administered 2016-09-07 – 2016-09-08 (×2): 40 mg via SUBCUTANEOUS
  Filled 2016-09-07 (×2): qty 0.4

## 2016-09-07 MED ORDER — ONDANSETRON HCL 4 MG PO TABS: 4.0000 mg | ORAL_TABLET | Freq: Four times a day (QID) | ORAL | Status: DC | PRN

## 2016-09-07 MED ORDER — HYDROCOD POLST-CPM POLST ER 10-8 MG/5ML PO SUER
5.0000 mL | Freq: Two times a day (BID) | ORAL | Status: DC | PRN
  Administered 2016-09-07 – 2016-09-09 (×3): 5 mL via ORAL
  Filled 2016-09-07 (×3): qty 5

## 2016-09-07 MED ORDER — TRAMADOL HCL 50 MG PO TABS
50.0000 mg | ORAL_TABLET | Freq: Four times a day (QID) | ORAL | Status: DC | PRN
Start: 2016-09-07 — End: 2016-09-08
  Administered 2016-09-08: 50 mg via ORAL
  Filled 2016-09-07: qty 1

## 2016-09-07 MED ORDER — SODIUM CHLORIDE 0.9 % IV SOLN: 250.0000 mL | INTRAVENOUS | Status: DC | PRN

## 2016-09-07 MED ORDER — ONDANSETRON HCL 4 MG/2ML IJ SOLN: 4.0000 mg | Freq: Four times a day (QID) | INTRAMUSCULAR | Status: DC | PRN

## 2016-09-07 MED ORDER — OSELTAMIVIR PHOSPHATE 75 MG PO CAPS
75.0000 mg | ORAL_CAPSULE | Freq: Two times a day (BID) | ORAL | Status: DC
  Administered 2016-09-07 – 2016-09-08 (×3): 75 mg via ORAL
  Filled 2016-09-07 (×3): qty 1

## 2016-09-07 NOTE — H&P (Signed)
[place] [facility] [place] NAME: Shelly [name]    MR#:  [id number]  DATE OF BIRTH:  1961/04/29  DATE OF ADMISSION:  09/07/2016  PRIMARY CARE PHYSICIAN: [name], NP   REQUESTING/REFERRING PHYSICIAN: Dr.Khan  CHIEF COMPLAINT:  No chief complaint on file.  Shortness of breath  HISTORY OF PRESENT ILLNESS:  Shelly [name]  is a 56 y.o. female with a known history of COPD, hypertension, past tobacco abuse presents to the hospital as a direct admission from Dr. Humphrey Rolls, pulmonary office for worsening shortness of breath and wheezing. Patient has fever of up to 102 at home. Afebrile today. She has been using doxycycline and prednisone for 5 days now without any improvement. Saturations of 90-92% on room air.  PAST MEDICAL HISTORY:   Past Medical History:  Diagnosis Date  . Asthma   . Diabetes mellitus without complication ([place])   . Hypertension    PAST SURGICAL HISTORY:  No past surgical history on file.  SOCIAL HISTORY:   Social History  Substance Use Topics  . Smoking status: Former Research scientist (life sciences)  . Smokeless tobacco: Never Used  . Alcohol use No    FAMILY HISTORY:   Family History  Problem Relation Age of Onset  . Adopted: Yes    DRUG ALLERGIES:  No Known Allergies  REVIEW OF SYSTEMS:   Review of Systems  Constitutional: Positive for malaise/fatigue. Negative for chills and fever.  HENT: Negative for sore throat.   Eyes: Negative for blurred vision, double vision and pain.  Respiratory: Positive for cough, shortness of breath and wheezing. Negative for hemoptysis.   Cardiovascular: Negative for chest pain, palpitations, orthopnea and leg swelling.  Gastrointestinal: Negative for abdominal pain, constipation, diarrhea, heartburn, nausea and vomiting.  Genitourinary: Negative for dysuria and hematuria.  Musculoskeletal: Positive for myalgias. Negative for back pain and joint pain.  Skin: Negative for rash.  Neurological: Positive  for weakness. Negative for sensory change, speech change, focal weakness and headaches.  Endo/Heme/Allergies: Does not bruise/bleed easily.  Psychiatric/Behavioral: Negative for depression. The patient is not nervous/anxious.     MEDICATIONS AT HOME:   Prior to Admission medications   Medication Sig Start Date End Date Taking? Authorizing Provider  butalbital-acetaminophen-caffeine (FIORICET, ESGIC) 50-325-40 MG tablet Take 1-2 tablets by mouth every 6 (six) hours as needed for headache. 03/25/16 03/25/17  Loney Hering, MD  ondansetron (ZOFRAN ODT) 4 MG disintegrating tablet Take 1 tablet (4 mg total) by mouth every 8 (eight) hours as needed for nausea or vomiting. 03/25/16   Loney Hering, MD  predniSONE (DELTASONE) 20 MG tablet Take 3 tablets (60 mg total) by mouth daily. 03/25/16   Loney Hering, MD  traMADol (ULTRAM) 50 MG tablet Take 1 tablet (50 mg total) by mouth every 6 (six) hours as needed. 01/25/16 01/24/17  Lavonia Drafts, MD     VITAL SIGNS:  Blood pressure 107/68, pulse 82, temperature 98.1 F (36.7 C), temperature source Oral, resp. rate 20, SpO2 95 %.  PHYSICAL EXAMINATION:  Physical Exam  GENERAL:  56 y.o.-year-old patient lying in the bed. Obese EYES: Pupils equal, round, reactive to light and accommodation. No scleral icterus. Extraocular muscles intact.  HEENT: Head atraumatic, normocephalic. Oropharynx and nasopharynx clear. No oropharyngeal erythema, moist oral mucosa  NECK:  Supple, no jugular venous distention. No thyroid enlargement, no tenderness.  LUNGS: Bilateral wheezing with decreased air entry. CARDIOVASCULAR: S1, S2 normal. No murmurs, rubs, or gallops.  ABDOMEN: Soft, nontender, nondistended. Bowel sounds present.  No organomegaly or mass.  EXTREMITIES: No pedal edema, cyanosis, or clubbing. + 2 pedal & radial pulses b/l.   NEUROLOGIC: Cranial nerves II through XII are intact. No focal Motor or sensory deficits appreciated b/l PSYCHIATRIC: The  patient is alert and oriented x 3. Good affect.  SKIN: No obvious rash, lesion, or ulcer.   LABORATORY PANEL:   CBC No results for input(s): WBC, HGB, HCT, PLT in the last 168 hours. ------------------------------------------------------------------------------------------------------------------  Chemistries  No results for input(s): NA, K, CL, CO2, GLUCOSE, BUN, CREATININE, CALCIUM, MG, AST, ALT, ALKPHOS, BILITOT in the last 168 hours.  Invalid input(s): GFRCGP ------------------------------------------------------------------------------------------------------------------  Cardiac Enzymes No results for input(s): TROPONINI in the last 168 hours. ------------------------------------------------------------------------------------------------------------------  RADIOLOGY:  No results found.   IMPRESSION AND PLAN:   *  COPD exacerbation -IV steroids, Antibiotics - Scheduled Nebulizers - Inhalers -Wean O2 as tolerated - Consult pulmonary - Influenza PCR with droplet isolation until we have results  BNP normal  * Diabetes mellitus. Sliding scale insulin. Will likely have high blood sugars due to steroids.  * Hypertension. Continue home medications.  * DVT prophylaxis with Lovenox   All the records are reviewed and case discussed with ED provider. Management plans discussed with the patient, family and they are in agreement.  CODE STATUS: FULL CODE  TOTAL TIME TAKING CARE OF THIS PATIENT: 40 minutes.   Hillary Bow R M.D on 09/07/2016 at 3:16 PM  Between 7am to 6pm - Pager - [phone]  After 6pm go to [email or web address] - password [email or web address] [facility]  [facility] [place] [facility]  Office  [phone]  CC: Primary care physician; [name], NP  Note: This dictation was prepared with Dragon dictation along with smaller phrase technology. Any transcriptional errors that result from this process are unintentional.

## 2016-09-08 DIAGNOSIS — E119 Type 2 diabetes mellitus without complications: Secondary | ICD-10-CM

## 2016-09-08 LAB — CREATININE, SERUM
CREATININE: 0.88 mg/dL (ref 0.44–1.00)
GFR calc Af Amer: >60 mL/min (ref >60)
GFR calc non Af Amer: >60 mL/min (ref >60)

## 2016-09-08 LAB — GLUCOSE, CAPILLARY
GLUCOSE-CAPILLARY: 121 mg/dL — AB (ref 65–99)
Glucose-Capillary: 162 mg/dL — ABNORMAL HIGH (ref 65–99)
Glucose-Capillary: 146 mg/dL — ABNORMAL HIGH (ref 65–99)
Glucose-Capillary: 150 mg/dL — ABNORMAL HIGH (ref 65–99)

## 2016-09-08 LAB — HEMOGLOBIN A1C
HEMOGLOBIN A1C: 6.9 % — AB (ref 4.8–5.6)
MEAN PLASMA GLUCOSE: 151 mg/dL

## 2016-09-08 MED ORDER — PREDNISONE 50 MG PO TABS
50.0000 mg | ORAL_TABLET | Freq: Every day | ORAL | Status: DC
  Administered 2016-09-08 – 2016-09-09 (×2): 50 mg via ORAL
  Filled 2016-09-08 (×2): qty 1

## 2016-09-08 MED ORDER — LEVOFLOXACIN 750 MG PO TABS
750.0000 mg | ORAL_TABLET | ORAL | Status: DC
  Administered 2016-09-08: 750 mg via ORAL
  Filled 2016-09-08: qty 1

## 2016-09-08 MED ORDER — HYDROCODONE-ACETAMINOPHEN 5-325 MG PO TABS
1.0000 | ORAL_TABLET | Freq: Four times a day (QID) | ORAL | Status: DC | PRN
  Administered 2016-09-08 (×2): 1 via ORAL
  Filled 2016-09-08 (×2): qty 1

## 2016-09-08 MED ORDER — GLIPIZIDE ER 5 MG PO TB24
10.0000 mg | ORAL_TABLET | Freq: Every day | ORAL | Status: DC
  Administered 2016-09-08: 10 mg via ORAL
  Filled 2016-09-08: qty 2

## 2016-09-08 MED ORDER — METFORMIN HCL 500 MG PO TABS
500.0000 mg | ORAL_TABLET | Freq: Two times a day (BID) | ORAL | Status: DC
  Administered 2016-09-08 – 2016-09-09 (×2): 500 mg via ORAL
  Filled 2016-09-08 (×2): qty 1

## 2016-09-08 NOTE — Progress Notes (Addendum)
[place] at [place] NAME: Shelly [name]    MR#:  [id number]  DATE OF BIRTH:  28-Apr-1961  SUBJECTIVE:  Came in with increasing sob and cough  REVIEW OF SYSTEMS:   Review of Systems  Constitutional: Negative for chills, fever and weight loss.  HENT: Negative for ear discharge, ear pain and nosebleeds.   Eyes: Negative for blurred vision, pain and discharge.  Respiratory: Positive for cough, sputum production and shortness of breath. Negative for wheezing and stridor.   Cardiovascular: Negative for chest pain, palpitations, orthopnea and PND.  Gastrointestinal: Negative for abdominal pain, diarrhea, nausea and vomiting.  Genitourinary: Negative for frequency and urgency.  Musculoskeletal: Negative for back pain and joint pain.  Neurological: Positive for weakness. Negative for sensory change, speech change and focal weakness.  Psychiatric/Behavioral: Negative for depression and hallucinations. The patient is not nervous/anxious.    Tolerating Diet:yes Tolerating PT: not needed  DRUG ALLERGIES:  No Known Allergies  VITALS:  Blood pressure 119/89, pulse 69, temperature 97.4 F (36.3 C), temperature source Oral, resp. rate 18, height 5\' 5"  (1.651 m), weight 95.1 kg (209 lb 11.2 oz), SpO2 91 %.  PHYSICAL EXAMINATION:   Physical Exam  GENERAL:  56 y.o.-year-old patient lying in the bed with no acute distress.  EYES: Pupils equal, round, reactive to light and accommodation. No scleral icterus. Extraocular muscles intact.  HEENT: Head atraumatic, normocephalic. Oropharynx and nasopharynx clear.  NECK:  Supple, no jugular venous distention. No thyroid enlargement, no tenderness.  LUNGS:coarse breath sounds bilaterally, no wheezing, rales, rhonchi. No use of accessory muscles of respiration.  CARDIOVASCULAR: S1, S2 normal. No murmurs, rubs, or gallops.  ABDOMEN: Soft, nontender, nondistended. Bowel sounds present. No organomegaly or  mass.  EXTREMITIES: No cyanosis, clubbing or edema b/l.    NEUROLOGIC: Cranial nerves II through XII are intact. No focal Motor or sensory deficits b/l.   PSYCHIATRIC:  patient is alert and oriented x 3.  SKIN: No obvious rash, lesion, or ulcer.   LABORATORY PANEL:  CBC  Recent Labs Lab 09/07/16 1459  WBC 6.2  HGB 13.7  HCT 40.4  PLT 174    Chemistries   Recent Labs Lab 09/07/16 1459  NA 134*  K 3.5  CL 96*  CO2 30  GLUCOSE 181*  BUN 18  CREATININE 1.20*  CALCIUM 8.3*  AST 32  ALT 19  ALKPHOS 42  BILITOT 0.4   Cardiac Enzymes No results for input(s): TROPONINI in the last 168 hours. RADIOLOGY:  X-ray Chest Pa And Lateral  Result Date: 09/07/2016 CLINICAL DATA:  Hypoxia. EXAM: CHEST  2 VIEW COMPARISON:  07/06/2016 . FINDINGS: Mediastinum and hilar structures normal. Bibasilar subsegmental atelectasis and/or scarring. No pleural effusion or pneumothorax. Heart size normal. IMPRESSION: No active cardiopulmonary disease. Electronically Signed   By: Marcello Moores  Register   On: 09/07/2016 15:27   ASSESSMENT AND PLAN:  Shelly [name]  is a 56 y.o. female with a known history of COPD, hypertension, past tobacco abuse presents to the hospital as a direct admission from Dr. Humphrey Rolls, pulmonary office for worsening shortness of breath and wheezing. Patient has fever of up to 102 at home  *  COPD exacerbation -IV steroids, Antibiotics - Scheduled Nebulizers - Inhalers -Wean O2 as tolerated. Assess for home oxygen use during daytime. Uses chronically 3 liters at bedtime - Influenza B PCR positive  * Diabetes mellitus. Sliding scale insulin.  -hba1c is 6.9 -will start pt on glipizide XL. -diabetes education  *  Hypertension. Continue home medications.  * DVT prophylaxis with Lovenox  Case discussed with Care Management/Social Worker. Management plans discussed with the patient, family and they are in agreement.  CODE STATUS: FULL   TOTAL TIME TAKING CARE OF THIS PATIENT:  30 minutes.  >50% time spent on counselling and coordination of care  POSSIBLE D/C IN 1 DAYS, DEPENDING ON CLINICAL CONDITION.  Note: This dictation was prepared with Dragon dictation along with smaller phrase technology. Any transcriptional errors that result from this process are unintentional.  Jamarria Real M.D on 09/08/2016 at 8:18 AM  Between 7am to 6pm - Pager - [phone]  After 6pm go to [email or web address] - password [email or web address] [place] [facility]  Office  [phone]  CC: Primary care physician; [name], NP

## 2016-09-08 NOTE — Progress Notes (Signed)
Pt c/o pain, tramadol not working per pt, norco was discontinued this morning by Dr. Posey Pronto.  Dr. Posey Pronto paged to make her aware that tramadol is not relieving pt's pain, verbal order given by Dr. Posey Pronto to discontinue tramadol and reorder norco.  Clarise Cruz, RN

## 2016-09-08 NOTE — Progress Notes (Signed)
CONCERNING: Antibiotic IV to Oral Route Change Policy  RECOMMENDATION: This patient is receiving levofloxacin by the intravenous route.  Based on criteria approved by the Pharmacy and Therapeutics Committee, the antibiotic(s) is/are being converted to the equivalent oral dose form(s).   DESCRIPTION: These criteria include:  Patient being treated for a respiratory tract infection, urinary tract infection, cellulitis or clostridium difficile associated diarrhea if on metronidazole  The patient is not neutropenic and does not exhibit a GI malabsorption state  The patient is eating (either orally or via tube) and/or has been taking other orally administered medications for a least 24 hours  The patient is improving clinically and has a Tmax < 100.5  If you have questions about this conversion, please contact the Pharmacy Department  []   [phone] )  [facility] [x]   [phone] )  [facility] []   [phone] )  [facility] []   [phone] )  [facility] []   [phone] )  [facility]   MLS

## 2016-09-08 NOTE — Plan of Care (Signed)
Problem: Food- and Nutrition-Related Knowledge Deficit (NB-1.1) Goal: Nutrition education Formal process to instruct or train a patient/client in a skill or to impart knowledge to help patients/clients voluntarily manage or modify food choices and eating behavior to maintain or improve health. Outcome: Completed/Met Date Met: 09/08/16  RD consulted for nutrition education regarding diabetes.   Lab Results  Component Value Date   HGBA1C 6.9 (H) 09/07/2016   Spoke with patient at bedside. She reports she has never had high blood glucose before. She does report she had been meeting with a Dietitian for weight loss but hasn't been in a while since she hurt her back. Patient reports usually eating 2-3 meals per day. For breakfast she has bacon, eggs, and toast. For lunch she eats at a restaurant and usually has a meat and two sides (stew beef with rice, macaroni and cheese, green beans). For dinner she cooks at home and may have soup and sandwich or a casserole (beef and potato) or baked spaghetti.   RD provided "Carbohydrate Counting for People with Diabetes" handout from the Academy of Nutrition and Dietetics. Discussed different food groups and their effects on blood sugar, emphasizing carbohydrate-containing foods. Provided list of carbohydrates and recommended serving sizes of common foods.  RD provided "Using Nutrition Labels: Carbohydrates" handout from the Academy of Nutrition and Dietetics. Discussed how to read a nutrition label including looking at serving size and servings per container. Reviewed that there are 15 grams of carbohydrate in one carbohydrate choice. Encouraged patient to use chart for range of carbohydrate grams per choice when reading nutrition labels.  Discussed importance of controlled and consistent carbohydrate intake throughout the day. Provided examples of ways to balance meals/snacks and encouraged intake of high-fiber, whole grain complex carbohydrates. Teach back  method used.  Expect fair compliance. Patient is amenable for RD to make consult for outpatient nutrition counseling.   Body mass index is 34.9 kg/m. Pt meets criteria for Obesity Class I based on current BMI.  Current diet order is Carbohydrate Modified, patient is consuming approximately 100% of meals at this time (per report). Labs and medications reviewed. No further nutrition interventions warranted at this time. RD contact information provided. If additional nutrition issues arise, please re-consult RD.  Willey Blade, MS, RD, LDN Pager: [phone] After Hours Pager: [phone]

## 2016-09-08 NOTE — Progress Notes (Signed)
Inpatient Diabetes Program Recommendations  AACE/ADA: New Consensus Statement on Inpatient Glycemic Control (2015)  Target Ranges:  Prepandial:   less than 140 mg/dL      Peak postprandial:   less than 180 mg/dL (1-2 hours)      Critically ill patients:  140 - 180 mg/dL   Lab Results  Component Value Date   GLUCAP 162 (H) 09/08/2016   HGBA1C 6.9 (H) 09/07/2016    Review of Glycemic Control  Results for Shelly [name], Shelly [name] (MRN [id number]) as of 09/08/2016 09:27  Ref. Range 09/07/2016 16:53 09/07/2016 20:21 09/07/2016 22:19 09/08/2016 07:40  Glucose-Capillary Latest Ref Range: 65 - 99 mg/dL 101 (H) 217 (H) 227 (H) 162 (H)     Diabetes history: Type 2 Outpatient Diabetes medications: none Current orders for Inpatient glycemic control: Glucotrol XL 10mg , Novolog 0-15 units tid, Novolog 0-5 units qhs  Inpatient Diabetes Program Recommendations:  Please d/c Glucotrol- per ADA- An insulin regimen with basal, nutritional, and correction components (basal-bolus) is the preferred treatment for patients with good nutritional intake.  A1C only 6.9%- treat basal needs with Lantus if necessary.   Noted, prednisone decreased today from 60mg  to 50mg  qam.   Will follow.   Gentry Fitz, RN, BA, MHA, CDE Diabetes Coordinator Inpatient Diabetes Program  [phone] (Team Pager) [phone] ([place]) 09/08/2016 9:30 AM

## 2016-09-09 ENCOUNTER — Ambulatory Visit: Payer: Medicare Other | Admitting: Physical Therapy

## 2016-09-09 LAB — GLUCOSE, CAPILLARY: Glucose-Capillary: 83 mg/dL (ref 65–99)

## 2016-09-09 MED ORDER — METFORMIN HCL 500 MG PO TABS: 500.0000 mg | ORAL_TABLET | Freq: Two times a day (BID) | ORAL | 1 refills | Status: DC

## 2016-09-09 MED ORDER — TRAMADOL HCL 50 MG PO TABS: 50.0000 mg | ORAL_TABLET | Freq: Four times a day (QID) | ORAL | 0 refills | Status: AC | PRN

## 2016-09-09 MED ORDER — OSELTAMIVIR PHOSPHATE 75 MG PO CAPS
75.0000 mg | ORAL_CAPSULE | Freq: Two times a day (BID) | ORAL | 0 refills | Status: DC
Start: 2016-09-09 — End: 2018-03-24

## 2016-09-09 MED ORDER — IPRATROPIUM-ALBUTEROL 0.5-2.5 (3) MG/3ML IN SOLN: 3.0000 mL | Freq: Four times a day (QID) | RESPIRATORY_TRACT | 0 refills | Status: DC | PRN

## 2016-09-09 MED ORDER — LEVOFLOXACIN 750 MG PO TABS: 750.0000 mg | ORAL_TABLET | ORAL | 0 refills | Status: DC

## 2016-09-09 MED ORDER — PREDNISONE 10 MG PO TABS: ORAL_TABLET | ORAL | 0 refills | Status: DC

## 2016-09-09 MED ORDER — HYDROCOD POLST-CPM POLST ER 10-8 MG/5ML PO SUER: 5.0000 mL | Freq: Two times a day (BID) | ORAL | 0 refills | Status: DC | PRN

## 2016-09-09 MED ORDER — FLUTICASONE FUROATE-VILANTEROL 200-25 MCG/INH IN AEPB: 1.0000 | INHALATION_SPRAY | Freq: Every day | RESPIRATORY_TRACT | 0 refills | Status: DC

## 2016-09-09 NOTE — Discharge Summary (Signed)
[facility] NAME: Shelly [name]    MR#:  [id number]  DATE OF BIRTH:  January 21, 1961  DATE OF ADMISSION:  09/07/2016 ADMITTING PHYSICIAN: Hillary Bow, MD  DATE OF DISCHARGE: 09/09/2016  PRIMARY CARE PHYSICIAN: [name], NP    ADMISSION DIAGNOSIS:  Acute on Chronic Respiratory Failure   DISCHARGE DIAGNOSIS:  Acute on chronic respiratory due to COPD flare Influenza B Dm-2 (new onset)  SECONDARY DIAGNOSIS:   Past Medical History:  Diagnosis Date  . Asthma   . Diabetes mellitus without complication ([place])   . Hypertension     HOSPITAL COURSE:  [name] a 56 y.o.femalewith a known history of COPD, hypertension, past tobacco abuse presents to the hospital as a direct admission from Dr. Humphrey Rolls, pulmonary office for worsening shortness of breath and wheezing. Patient has fever of up to 102 at home  * acute on chronic respiratory failure due toCOPD exacerbation -IV steroids, Antibiotics - Scheduled Nebulizers - Inhalers -Wean O2 as tolerated. uses chronically 3 liters at bedtime. sats 96% on RA - Influenza B PCR positive  * Diabetes mellitus. Sliding scale insulin.  -hba1c is 6.9 -will start pt on metformin 500 mg bid -diabetes education done  * Hypertension. Continue home medications.  * DVT prophylaxis with Lovenox  D/c home. Much improved  CONSULTS OBTAINED:  Treatment Team:  [name], MD  DRUG ALLERGIES:  No Known Allergies  DISCHARGE MEDICATIONS:   Current Discharge Medication List    START taking these medications   Details  chlorpheniramine-HYDROcodone (TUSSIONEX) 10-8 MG/5ML SUER Take 5 mLs by mouth every 12 (twelve) hours as needed for cough. Qty: 140 mL, Refills: 0    fluticasone furoate-vilanterol (BREO ELLIPTA) 200-25 MCG/INH AEPB Inhale 1 puff into the lungs daily. Qty: 1 each, Refills: 0    ipratropium-albuterol (DUONEB) 0.5-2.5 (3) MG/3ML SOLN Take 3 mLs by  nebulization every 6 (six) hours as needed. Qty: 360 mL, Refills: 0    levofloxacin (LEVAQUIN) 750 MG tablet Take 1 tablet (750 mg total) by mouth daily. Qty: 3 tablet, Refills: 0    metFORMIN (GLUCOPHAGE) 500 MG tablet Take 1 tablet (500 mg total) by mouth 2 (two) times daily with a meal. Qty: 60 tablet, Refills: 1    oseltamivir (TAMIFLU) 75 MG capsule Take 1 capsule (75 mg total) by mouth 2 (two) times daily. Qty: 7 capsule, Refills: 0      CONTINUE these medications which have CHANGED   Details  predniSONE (DELTASONE) 10 MG tablet Take 50 mg taper by 10 mg daily then stop Qty: 15 tablet, Refills: 0    traMADol (ULTRAM) 50 MG tablet Take 1 tablet (50 mg total) by mouth every 6 (six) hours as needed. Qty: 15 tablet, Refills: 0      CONTINUE these medications which have NOT CHANGED   Details  butalbital-acetaminophen-caffeine (FIORICET, ESGIC) 50-325-40 MG tablet Take 1-2 tablets by mouth every 6 (six) hours as needed for headache. Qty: 20 tablet, Refills: 0    furosemide (LASIX) 20 MG tablet Take 20 mg by mouth daily.    lisinopril (PRINIVIL,ZESTRIL) 10 MG tablet Take 20 mg by mouth daily.    ondansetron (ZOFRAN ODT) 4 MG disintegrating tablet Take 1 tablet (4 mg total) by mouth every 8 (eight) hours as needed for nausea or vomiting. Qty: 20 tablet, Refills: 0        If you experience worsening of your admission symptoms, develop shortness of breath, life threatening emergency, suicidal  or homicidal thoughts you must seek medical attention immediately by calling 911 or calling your MD immediately  if symptoms less severe.  You Must read complete instructions/literature along with all the possible adverse reactions/side effects for all the Medicines you take and that have been prescribed to you. Take any new Medicines after you have completely understood and accept all the possible adverse reactions/side effects.   Please note  You were cared for by a hospitalist during  your hospital stay. If you have any questions about your discharge medications or the care you received while you were in the hospital after you are discharged, you can call the unit and asked to speak with the hospitalist on call if the hospitalist that took care of you is not available. Once you are discharged, your primary care physician will handle any further medical issues. Please note that NO REFILLS for any discharge medications will be authorized once you are discharged, as it is imperative that you return to your primary care physician (or establish a relationship with a primary care physician if you do not have one) for your aftercare needs so that they can reassess your need for medications and monitor your lab values. Today   SUBJECTIVE  Doing well. Some cough   VITAL SIGNS:  Blood pressure 112/78, pulse 70, temperature 97.8 F (36.6 C), temperature source Oral, resp. rate 19, height 5\' 5"  (1.651 m), weight 96 kg (211 lb 9.6 oz), SpO2 96 %.  I/O:  No intake or output data in the 24 hours ending 09/09/16 0806  PHYSICAL EXAMINATION:  GENERAL:  56 y.o.-year-old patient lying in the bed with no acute distress. obese EYES: Pupils equal, round, reactive to light and accommodation. No scleral icterus. Extraocular muscles intact.  HEENT: Head atraumatic, normocephalic. Oropharynx and nasopharynx clear.  NECK:  Supple, no jugular venous distention. No thyroid enlargement, no tenderness.  LUNGS: Normal breath sounds bilaterally, no wheezing, rales,rhonchi or crepitation. No use of accessory muscles of respiration.  CARDIOVASCULAR: S1, S2 normal. No murmurs, rubs, or gallops.  ABDOMEN: Soft, non-tender, non-distended. Bowel sounds present. No organomegaly or mass.  EXTREMITIES: No pedal edema, cyanosis, or clubbing.  NEUROLOGIC: Cranial nerves II through XII are intact. Muscle strength 5/5 in all extremities. Sensation intact. Gait not checked.  PSYCHIATRIC: The patient is alert and  oriented x 3.  SKIN: No obvious rash, lesion, or ulcer.   DATA REVIEW:   CBC   Recent Labs Lab 09/07/16 1459  WBC 6.2  HGB 13.7  HCT 40.4  PLT 174    Chemistries   Recent Labs Lab 09/07/16 1459 09/08/16 0945  NA 134*  --   K 3.5  --   CL 96*  --   CO2 30  --   GLUCOSE 181*  --   BUN 18  --   CREATININE 1.20* 0.88  CALCIUM 8.3*  --   AST 32  --   ALT 19  --   ALKPHOS 42  --   BILITOT 0.4  --     Microbiology Results   No results found for this or any previous visit (from the past 240 hour(s)).  RADIOLOGY:  X-ray Chest Pa And Lateral  Result Date: 09/07/2016 CLINICAL DATA:  Hypoxia. EXAM: CHEST  2 VIEW COMPARISON:  07/06/2016 . FINDINGS: Mediastinum and hilar structures normal. Bibasilar subsegmental atelectasis and/or scarring. No pleural effusion or pneumothorax. Heart size normal. IMPRESSION: No active cardiopulmonary disease. Electronically Signed   By: Marcello Moores  Register   On: 09/07/2016  15:27     Management plans discussed with the patient, family and they are in agreement.  CODE STATUS:     Code Status Orders        Start     Ordered   09/07/16 1455  Full code  Continuous     09/07/16 1455    Code Status History    Date Active Date Inactive Code Status Order ID Comments User Context   This patient has a current code status but no historical code status.      TOTAL TIME TAKING CARE OF THIS PATIENT: 40 minutes.    Bartolo Montanye M.D on 09/09/2016 at 8:06 AM  Between 7am to 6pm - Pager - [phone] After 6pm go to [email or web address] - password [email or web address] [facility]  Office  [phone]  CC: Primary care physician; [name], NP

## 2016-09-09 NOTE — Discharge Instructions (Signed)
Use the nebulizer Keep log of your sugars

## 2016-09-15 DIAGNOSIS — J441 Chronic obstructive pulmonary disease with (acute) exacerbation: Secondary | ICD-10-CM | POA: Diagnosis not present

## 2016-09-15 DIAGNOSIS — E119 Type 2 diabetes mellitus without complications: Secondary | ICD-10-CM | POA: Diagnosis not present

## 2016-09-15 DIAGNOSIS — R7309 Other abnormal glucose: Secondary | ICD-10-CM | POA: Diagnosis not present

## 2016-09-17 ENCOUNTER — Ambulatory Visit: Payer: Medicare Other | Admitting: Physical Therapy

## 2016-09-24 ENCOUNTER — Ambulatory Visit: Payer: Medicare Other | Admitting: Physical Therapy

## 2016-09-29 ENCOUNTER — Ambulatory Visit: Payer: Medicare Other | Admitting: Physical Therapy

## 2016-10-14 DIAGNOSIS — F411 Generalized anxiety disorder: Secondary | ICD-10-CM | POA: Diagnosis not present

## 2016-10-14 DIAGNOSIS — G4701 Insomnia due to medical condition: Secondary | ICD-10-CM | POA: Diagnosis not present

## 2016-10-14 DIAGNOSIS — J449 Chronic obstructive pulmonary disease, unspecified: Secondary | ICD-10-CM | POA: Diagnosis not present

## 2016-10-14 DIAGNOSIS — R7309 Other abnormal glucose: Secondary | ICD-10-CM | POA: Diagnosis not present

## 2016-10-24 DIAGNOSIS — J441 Chronic obstructive pulmonary disease with (acute) exacerbation: Secondary | ICD-10-CM | POA: Diagnosis not present

## 2016-10-24 DIAGNOSIS — R609 Edema, unspecified: Secondary | ICD-10-CM | POA: Diagnosis not present

## 2016-10-24 DIAGNOSIS — J189 Pneumonia, unspecified organism: Secondary | ICD-10-CM | POA: Diagnosis not present

## 2016-11-03 ENCOUNTER — Other Ambulatory Visit: Payer: Self-pay | Admitting: Primary Care

## 2016-11-03 DIAGNOSIS — Z1231 Encounter for screening mammogram for malignant neoplasm of breast: Secondary | ICD-10-CM

## 2016-12-26 DIAGNOSIS — B370 Candidal stomatitis: Secondary | ICD-10-CM | POA: Diagnosis not present

## 2016-12-26 DIAGNOSIS — J441 Chronic obstructive pulmonary disease with (acute) exacerbation: Secondary | ICD-10-CM | POA: Diagnosis not present

## 2016-12-31 DIAGNOSIS — R7309 Other abnormal glucose: Secondary | ICD-10-CM | POA: Diagnosis not present

## 2016-12-31 DIAGNOSIS — E119 Type 2 diabetes mellitus without complications: Secondary | ICD-10-CM | POA: Diagnosis not present

## 2016-12-31 DIAGNOSIS — M545 Low back pain: Secondary | ICD-10-CM | POA: Diagnosis not present

## 2017-02-08 ENCOUNTER — Emergency Department
Admission: EM | Admit: 2017-02-08 | Discharge: 2017-02-08 | Disposition: A | Discharge status: Home / Self Care | Payer: Medicare Other | Attending: Emergency Medicine | Admitting: Emergency Medicine

## 2017-02-08 ENCOUNTER — Encounter: Payer: Self-pay | Admitting: Emergency Medicine

## 2017-02-08 ENCOUNTER — Emergency Department: Payer: Medicare Other

## 2017-02-08 DIAGNOSIS — Y929 Unspecified place or not applicable: Secondary | ICD-10-CM | POA: Diagnosis not present

## 2017-02-08 DIAGNOSIS — W19XXXA Unspecified fall, initial encounter: Secondary | ICD-10-CM | POA: Diagnosis not present

## 2017-02-08 DIAGNOSIS — Z87891 Personal history of nicotine dependence: Secondary | ICD-10-CM | POA: Insufficient documentation

## 2017-02-08 DIAGNOSIS — S8991XA Unspecified injury of right lower leg, initial encounter: Secondary | ICD-10-CM | POA: Diagnosis present

## 2017-02-08 DIAGNOSIS — S82831A Other fracture of upper and lower end of right fibula, initial encounter for closed fracture: Secondary | ICD-10-CM | POA: Diagnosis not present

## 2017-02-08 DIAGNOSIS — S82401A Unspecified fracture of shaft of right fibula, initial encounter for closed fracture: Secondary | ICD-10-CM | POA: Insufficient documentation

## 2017-02-08 DIAGNOSIS — E119 Type 2 diabetes mellitus without complications: Secondary | ICD-10-CM | POA: Insufficient documentation

## 2017-02-08 DIAGNOSIS — J45909 Unspecified asthma, uncomplicated: Secondary | ICD-10-CM | POA: Diagnosis not present

## 2017-02-08 DIAGNOSIS — Y9389 Activity, other specified: Secondary | ICD-10-CM | POA: Diagnosis not present

## 2017-02-08 DIAGNOSIS — M79661 Pain in right lower leg: Secondary | ICD-10-CM | POA: Diagnosis not present

## 2017-02-08 DIAGNOSIS — Y990 Civilian activity done for income or pay: Secondary | ICD-10-CM | POA: Diagnosis not present

## 2017-02-08 DIAGNOSIS — I10 Essential (primary) hypertension: Secondary | ICD-10-CM | POA: Diagnosis not present

## 2017-02-08 DIAGNOSIS — Z7984 Long term (current) use of oral hypoglycemic drugs: Secondary | ICD-10-CM | POA: Insufficient documentation

## 2017-02-08 DIAGNOSIS — J441 Chronic obstructive pulmonary disease with (acute) exacerbation: Secondary | ICD-10-CM | POA: Diagnosis not present

## 2017-02-08 DIAGNOSIS — M79604 Pain in right leg: Secondary | ICD-10-CM | POA: Diagnosis not present

## 2017-02-08 MED ORDER — OXYCODONE-ACETAMINOPHEN 5-325 MG PO TABS
1.0000 | ORAL_TABLET | Freq: Once | ORAL | Status: AC
  Administered 2017-02-08: 1 via ORAL
  Filled 2017-02-08: qty 1

## 2017-02-08 MED ORDER — NAPROXEN 500 MG PO TABS
500.0000 mg | ORAL_TABLET | Freq: Once | ORAL | Status: AC
  Administered 2017-02-08: 500 mg via ORAL
  Filled 2017-02-08: qty 1

## 2017-02-08 MED ORDER — OXYCODONE-ACETAMINOPHEN 7.5-325 MG PO TABS: 1.0000 | ORAL_TABLET | Freq: Four times a day (QID) | ORAL | 0 refills | Status: DC | PRN

## 2017-02-08 MED ORDER — HYDROMORPHONE HCL 1 MG/ML IJ SOLN
0.5000 mg | Freq: Once | INTRAMUSCULAR | Status: AC
  Administered 2017-02-08: 0.5 mg via INTRAMUSCULAR
  Filled 2017-02-08: qty 1

## 2017-02-08 NOTE — ED Provider Notes (Signed)
[facility] Emergency Department Provider Note   ____________________________________________   First MD Initiated Contact with Patient 02/08/17 1816     (approximate)  I have reviewed the triage vital signs and the nursing notes.   HISTORY  Chief Complaint Fall and Leg Pain    HPI Shelly [name] is a 56 y.o. 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 Patient is having pain to the mid lateral aspect of her right leg. Patient states she heard a "snap" when she fell. Patient rates pain as a 10 over 10. Patient described a pain as "sharp". No palliative measures prior to arrival.  Past Medical History:  Diagnosis Date  . Asthma   . Diabetes mellitus without complication ([place])   . Hypertension     Patient Active Problem List   Diagnosis Date Noted  . DM (diabetes mellitus), type 2 ([place]) 09/08/2016  . COPD exacerbation ([place]) 09/07/2016    History reviewed. No pertinent surgical history.  Prior to Admission medications   Medication Sig Start Date End Date Taking? Authorizing Provider  butalbital-acetaminophen-caffeine (FIORICET, ESGIC) 50-325-40 MG tablet Take 1-2 tablets by mouth every 6 (six) hours as needed for headache. 03/25/16 03/25/17  Loney Hering, MD  chlorpheniramine-HYDROcodone (TUSSIONEX) 10-8 MG/5ML SUER Take 5 mLs by mouth every 12 (twelve) hours as needed for cough. 09/09/16   Fritzi Mandes, MD  fluticasone furoate-vilanterol (BREO ELLIPTA) 200-25 MCG/INH AEPB Inhale 1 puff into the lungs daily. 09/09/16   Fritzi Mandes, MD  furosemide (LASIX) 20 MG tablet Take 20 mg by mouth daily.    [provider]  ipratropium-albuterol (DUONEB) 0.5-2.5 (3) MG/3ML SOLN Take 3 mLs by nebulization every 6 (six) hours as needed. 09/09/16   Fritzi Mandes, MD  levofloxacin (LEVAQUIN) 750 MG tablet Take 1 tablet (750 mg total) by mouth daily. 09/09/16   Fritzi Mandes, MD  lisinopril (PRINIVIL,ZESTRIL)  10 MG tablet Take 20 mg by mouth daily.    [provider]  metFORMIN (GLUCOPHAGE) 500 MG tablet Take 1 tablet (500 mg total) by mouth 2 (two) times daily with a meal. 09/09/16   Fritzi Mandes, MD  ondansetron (ZOFRAN ODT) 4 MG disintegrating tablet Take 1 tablet (4 mg total) by mouth every 8 (eight) hours as needed for nausea or vomiting. 03/25/16   Loney Hering, MD  oseltamivir (TAMIFLU) 75 MG capsule Take 1 capsule (75 mg total) by mouth 2 (two) times daily. 09/09/16   Fritzi Mandes, MD  oxyCODONE-acetaminophen (PERCOCET) 7.5-325 MG tablet Take 1 tablet by mouth every 6 (six) hours as needed for severe pain. 02/08/17   Sable Feil, PA-C  predniSONE (DELTASONE) 10 MG tablet Take 50 mg taper by 10 mg daily then stop 09/10/16   Fritzi Mandes, MD  traMADol (ULTRAM) 50 MG tablet Take 1 tablet (50 mg total) by mouth every 6 (six) hours as needed. 09/09/16 09/09/17  Fritzi Mandes, MD    Allergies Patient has no known allergies.  Family History  Problem Relation Age of Onset  . Adopted: Yes    Social History Social History  Substance Use Topics  . Smoking status: Former Research scientist (life sciences)  . Smokeless tobacco: Never Used  . Alcohol use No    Review of Systems  Constitutional: No fever/chills Eyes: No visual changes. ENT: No sore throat. Cardiovascular: Denies chest pain. Respiratory: Denies shortness of breath. Gastrointestinal: No abdominal pain.  No nausea, no vomiting.  No diarrhea.  No constipation.  Genitourinary: Negative for dysuria. Musculoskeletal: Negative for back pain. Skin: Negative for rash. Neurological: Negative for headaches, focal weakness or numbness. Endocrine:Diabetes and hypertension  ____________________________________________   PHYSICAL EXAM:  VITAL SIGNS: ED Triage Vitals  Enc Vitals Group     BP 02/08/17 1759 131/89     Pulse Rate 02/08/17 1759 87     Resp 02/08/17 1759 20     Temp 02/08/17 1759 98.4 F (36.9 C)     Temp Source 02/08/17 1759 Oral     SpO2  02/08/17 1759 93 %     Weight 02/08/17 1800 200 lb (90.7 kg)     Height 02/08/17 1800 5\' 5"  (1.651 m)     Head Circumference --      Peak Flow --      Pain Score 02/08/17 1759 10     Pain Loc --      Pain Edu? --      Excl. in [place]? --    Constitutional: Alert and oriented. Moderate distress and anxious  Cardiovascular: Normal rate, regular rhythm. Grossly normal heart sounds.  Good peripheral circulation. Respiratory: Normal respiratory effort.  No retractions. Lungs CTAB. Musculoskeletal: No obvious deformity to the right leg. Mild edema is appreciated. Patient is moderate guarding palpation superior aspect of the fibula. Patient refused to weight-bear. Neurologic:  Normal speech and language. No gross focal neurologic deficits are appreciated. No gait instability. Skin:  Skin is warm, dry and intact. No rash noted. Psychiatric: Mood and affect are normal. Speech and behavior are normal.  ____________________________________________   LABS (all labs ordered are listed, but only abnormal results are displayed)  Labs Reviewed - No data to display ____________________________________________  EKG   ____________________________________________  RADIOLOGY  Dg Tibia/fibula Right  Result Date: 02/08/2017 CLINICAL DATA:  Status post fall with right lower leg pain. EXAM: RIGHT TIBIA AND FIBULA - 2 VIEW COMPARISON:  None. FINDINGS: Displaced fracture of the proximal fibular shaft is noted. The other visualized bony structures are normal. IMPRESSION: Fracture of proximal fibular shaft. Electronically Signed   By: Abelardo Diesel M.D.   On: 02/08/2017 18:31    _Displayed fracture the proximal fibular shaft right leg. ___________________________________________   PROCEDURES  Procedure(s) performed: None  Procedures  Critical Care performed: No  ____________________________________________   INITIAL IMPRESSION / ASSESSMENT AND PLAN / ED COURSE  Pertinent labs & imaging results  that were available during my care of the patient were reviewed by me and considered in my medical decision making (see chart for details).  Right proximal fibular fracture secondary to fall. Discussed x-ray finding with patient. Patient given discharge care instructions. Patient placed in a knee immobilizer and given crutches for ambulation. Patient advised to contact orthopedic clinic in the morning to schedule follow-up appointment.      ____________________________________________   FINAL CLINICAL IMPRESSION(S) / ED DIAGNOSES  Final diagnoses:  Closed fracture of shaft of right fibula, unspecified fracture morphology, initial encounter      NEW MEDICATIONS STARTED DURING THIS VISIT:  New Prescriptions   OXYCODONE-ACETAMINOPHEN (PERCOCET) 7.5-325 MG TABLET    Take 1 tablet by mouth every 6 (six) hours as needed for severe pain.     Note:  This document was prepared using Dragon voice recognition software and may include unintentional dictation errors.    Sable Feil, PA-C 02/08/17 1844    Carrie Mew, MD 02/15/17 [phone]

## 2017-02-08 NOTE — Discharge Instructions (Signed)
Wear splint and behavior crutches until evaluation by orthopedics.

## 2017-02-08 NOTE — ED Triage Notes (Signed)
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

## 2017-02-11 DIAGNOSIS — E119 Type 2 diabetes mellitus without complications: Secondary | ICD-10-CM | POA: Diagnosis not present

## 2017-02-11 DIAGNOSIS — R7309 Other abnormal glucose: Secondary | ICD-10-CM | POA: Diagnosis not present

## 2017-02-11 DIAGNOSIS — S82401A Unspecified fracture of shaft of right fibula, initial encounter for closed fracture: Secondary | ICD-10-CM | POA: Diagnosis not present

## 2017-02-11 DIAGNOSIS — F411 Generalized anxiety disorder: Secondary | ICD-10-CM | POA: Diagnosis not present

## 2017-02-11 DIAGNOSIS — E669 Obesity, unspecified: Secondary | ICD-10-CM | POA: Diagnosis not present

## 2017-02-11 DIAGNOSIS — J449 Chronic obstructive pulmonary disease, unspecified: Secondary | ICD-10-CM | POA: Diagnosis not present

## 2017-02-11 DIAGNOSIS — G4733 Obstructive sleep apnea (adult) (pediatric): Secondary | ICD-10-CM | POA: Diagnosis not present

## 2017-02-16 DIAGNOSIS — S82831A Other fracture of upper and lower end of right fibula, initial encounter for closed fracture: Secondary | ICD-10-CM | POA: Diagnosis not present

## 2017-02-16 DIAGNOSIS — M79604 Pain in right leg: Secondary | ICD-10-CM | POA: Diagnosis not present

## 2017-03-03 DIAGNOSIS — S82831D Other fracture of upper and lower end of right fibula, subsequent encounter for closed fracture with routine healing: Secondary | ICD-10-CM | POA: Diagnosis not present

## 2017-03-03 DIAGNOSIS — S82831A Other fracture of upper and lower end of right fibula, initial encounter for closed fracture: Secondary | ICD-10-CM | POA: Diagnosis not present

## 2017-03-31 DIAGNOSIS — S82831D Other fracture of upper and lower end of right fibula, subsequent encounter for closed fracture with routine healing: Secondary | ICD-10-CM | POA: Diagnosis not present

## 2017-04-01 DIAGNOSIS — R7309 Other abnormal glucose: Secondary | ICD-10-CM | POA: Diagnosis not present

## 2017-04-01 DIAGNOSIS — E669 Obesity, unspecified: Secondary | ICD-10-CM | POA: Diagnosis not present

## 2017-04-01 DIAGNOSIS — Z1389 Encounter for screening for other disorder: Secondary | ICD-10-CM | POA: Diagnosis not present

## 2017-04-01 DIAGNOSIS — J441 Chronic obstructive pulmonary disease with (acute) exacerbation: Secondary | ICD-10-CM | POA: Diagnosis not present

## 2017-04-19 DIAGNOSIS — J449 Chronic obstructive pulmonary disease, unspecified: Secondary | ICD-10-CM | POA: Diagnosis not present

## 2017-04-19 DIAGNOSIS — R062 Wheezing: Secondary | ICD-10-CM | POA: Diagnosis not present

## 2017-04-20 ENCOUNTER — Other Ambulatory Visit: Payer: Self-pay | Admitting: Internal Medicine

## 2017-04-20 DIAGNOSIS — R059 Cough, unspecified: Secondary | ICD-10-CM

## 2017-04-20 DIAGNOSIS — R05 Cough: Secondary | ICD-10-CM

## 2017-04-28 ENCOUNTER — Ambulatory Visit
Admission: RE | Admit: 2017-04-28 | Discharge: 2017-04-28 | Disposition: A | Discharge status: Home / Self Care | Payer: Medicare Other | Source: Ambulatory Visit | Attending: Internal Medicine | Admitting: Internal Medicine

## 2017-04-28 DIAGNOSIS — J984 Other disorders of lung: Secondary | ICD-10-CM | POA: Insufficient documentation

## 2017-04-28 DIAGNOSIS — D3501 Benign neoplasm of right adrenal gland: Secondary | ICD-10-CM | POA: Insufficient documentation

## 2017-04-28 DIAGNOSIS — R0602 Shortness of breath: Secondary | ICD-10-CM | POA: Insufficient documentation

## 2017-04-28 DIAGNOSIS — R05 Cough: Secondary | ICD-10-CM

## 2017-04-28 DIAGNOSIS — R059 Cough, unspecified: Secondary | ICD-10-CM

## 2017-04-28 DIAGNOSIS — I700 Atherosclerosis of aorta: Secondary | ICD-10-CM | POA: Insufficient documentation

## 2017-04-28 LAB — POCT I-STAT CREATININE: Creatinine, Ser: 0.8 mg/dL (ref 0.44–1.00)

## 2017-04-28 MED ORDER — IOPAMIDOL (ISOVUE-300) INJECTION 61%
75.0000 mL | Freq: Once | INTRAVENOUS | Status: AC | PRN
  Administered 2017-04-28: 75 mL via INTRAVENOUS

## 2017-05-05 DIAGNOSIS — R0602 Shortness of breath: Secondary | ICD-10-CM | POA: Diagnosis not present

## 2017-05-20 ENCOUNTER — Ambulatory Visit: Payer: Self-pay | Admitting: Internal Medicine

## 2017-08-19 ENCOUNTER — Telehealth: Payer: Self-pay | Admitting: Internal Medicine

## 2017-08-19 NOTE — Telephone Encounter (Signed)
[facility] called needing last date of office visit, pt last seen in [place] 04/2017, she will need current appt to recert for P6/ BR

## 2017-09-09 ENCOUNTER — Ambulatory Visit (INDEPENDENT_AMBULATORY_CARE_PROVIDER_SITE_OTHER): Payer: Medicare Other | Admitting: Internal Medicine

## 2017-09-09 ENCOUNTER — Encounter: Payer: Self-pay | Admitting: Internal Medicine

## 2017-09-09 VITALS — BP 152/98 | HR 111 | Resp 20 | Ht 65.0 in | Wt 198.2 lb

## 2017-09-09 DIAGNOSIS — R0602 Shortness of breath: Secondary | ICD-10-CM | POA: Diagnosis not present

## 2017-09-09 DIAGNOSIS — Z9981 Dependence on supplemental oxygen: Secondary | ICD-10-CM

## 2017-09-09 DIAGNOSIS — J4489 Other specified chronic obstructive pulmonary disease: Secondary | ICD-10-CM

## 2017-09-09 DIAGNOSIS — J301 Allergic rhinitis due to pollen: Secondary | ICD-10-CM | POA: Diagnosis not present

## 2017-09-09 DIAGNOSIS — J9611 Chronic respiratory failure with hypoxia: Secondary | ICD-10-CM

## 2017-09-09 DIAGNOSIS — J449 Chronic obstructive pulmonary disease, unspecified: Secondary | ICD-10-CM

## 2017-09-09 NOTE — Addendum Note (Signed)
Addended by: Devona Konig on: 09/09/2017 12:34 PM   Modules accepted: Orders

## 2017-09-09 NOTE — Patient Instructions (Signed)

## 2017-09-09 NOTE — Progress Notes (Addendum)
[facility] [place], [place] [address]  Pulmonary Sleep Medicine   Office Visit Note  Patient Name: Shelly [name] DOB: Apr 03, 1961 MRN [id number]  Date of Service: 09/09/2017  Complaints/HPI:  Patient is here for follow-up of COPD she is doing well overall has not had any admissions to the hospital.  She does have increasing shortness of breath.  She states that she is using oxygen nighttime.  She does need a face-to-face evaluation on this is the reason that she is here today for her oxygen.  We reviewed her oxygen saturations and they were 85% with ambulation off of oxygen.  She required 3 L oxygen to improve her saturations. In addition she is complaining about a lot of rhinitis related to pollens.  She has not been checked for allergies in the past.  She states that she finds Benadryl does not really seem to help her.  I suggested we do allergy testing evaluate further  ROS  General: (-) fever, (-) chills, (-) night sweats, (-) weakness Skin: (-) rashes, (-) itching,. Eyes: (-) visual changes, (-) redness, (-) itching. Nose and Sinuses: (-) nasal stuffiness or itchiness, (-) postnasal drip, (-) nosebleeds, (-) sinus trouble. Mouth and Throat: (-) sore throat, (-) hoarseness. Neck: (-) swollen glands, (-) enlarged thyroid, (-) neck pain. Respiratory: - cough, (-) bloody sputum, + shortness of breath, - wheezing. Cardiovascular: - ankle swelling, (-) chest pain. Lymphatic: (-) lymph node enlargement. Neurologic: (-) numbness, (-) tingling. Psychiatric: (-) anxiety, (-) depression   Current Medication: Outpatient Encounter Medications as of 09/09/2017  Medication Sig  . fluticasone furoate-vilanterol (BREO ELLIPTA) 200-25 MCG/INH AEPB Inhale 1 puff into the lungs daily.  . furosemide (LASIX) 20 MG tablet Take 20 mg by mouth daily.  Marland Kitchen ipratropium-albuterol (DUONEB) 0.5-2.5 (3) MG/3ML SOLN Take 3 mLs by nebulization every 6 (six) hours as needed.  Marland Kitchen  lisinopril (PRINIVIL,ZESTRIL) 10 MG tablet Take 20 mg by mouth daily.  . VENTOLIN HFA 108 (90 Base) MCG/ACT inhaler   . metFORMIN (GLUCOPHAGE) 500 MG tablet Take 1 tablet (500 mg total) by mouth 2 (two) times daily with a meal. (Patient not taking: Reported on 09/09/2017)  . ondansetron (ZOFRAN ODT) 4 MG disintegrating tablet Take 1 tablet (4 mg total) by mouth every 8 (eight) hours as needed for nausea or vomiting. (Patient not taking: Reported on 09/09/2017)  . oseltamivir (TAMIFLU) 75 MG capsule Take 1 capsule (75 mg total) by mouth 2 (two) times daily. (Patient not taking: Reported on 09/09/2017)  . oxyCODONE-acetaminophen (PERCOCET) 7.5-325 MG tablet Take 1 tablet by mouth every 6 (six) hours as needed for severe pain. (Patient not taking: Reported on 09/09/2017)  . predniSONE (DELTASONE) 10 MG tablet Take 50 mg taper by 10 mg daily then stop (Patient not taking: Reported on 09/09/2017)  . traMADol (ULTRAM) 50 MG tablet Take 1 tablet (50 mg total) by mouth every 6 (six) hours as needed. (Patient not taking: Reported on 09/09/2017)  . [DISCONTINUED] chlorpheniramine-HYDROcodone (TUSSIONEX) 10-8 MG/5ML SUER Take 5 mLs by mouth every 12 (twelve) hours as needed for cough. (Patient not taking: Reported on 09/09/2017)  . [DISCONTINUED] levofloxacin (LEVAQUIN) 750 MG tablet Take 1 tablet (750 mg total) by mouth daily. (Patient not taking: Reported on 09/09/2017)   No facility-administered encounter medications on file as of 09/09/2017.     Surgical History: Past Surgical History:  Procedure Laterality Date  . ABDOMINAL HYSTERECTOMY    . COLON SURGERY      Medical History: Past  Medical History:  Diagnosis Date  . Asthma   . Diabetes mellitus without complication ([place])   . Hypertension     Family History: Family History  Adopted: Yes  Family history unknown: Yes    Social History: Social History   Socioeconomic History  . Marital status: Married    Spouse name: Not on file  . Number of children:  Not on file  . Years of education: Not on file  . Highest education level: Not on file  Occupational History  . Not on file  Social Needs  . Financial resource strain: Not on file  . Food insecurity:    Worry: Not on file    Inability: Not on file  . Transportation needs:    Medical: Not on file    Non-medical: Not on file  Tobacco Use  . Smoking status: Former Research scientist (life sciences)  . Smokeless tobacco: Never Used  Substance and Sexual Activity  . Alcohol use: No  . Drug use: No  . Sexual activity: Not on file  Lifestyle  . Physical activity:    Days per week: Not on file    Minutes per session: Not on file  . Stress: Not on file  Relationships  . Social connections:    Talks on phone: Not on file    Gets together: Not on file    Attends religious service: Not on file    Active member of club or organization: Not on file    Attends meetings of clubs or organizations: Not on file    Relationship status: Not on file  . Intimate partner violence:    Fear of current or ex partner: Not on file    Emotionally abused: Not on file    Physically abused: Not on file    Forced sexual activity: Not on file  Other Topics Concern  . Not on file  Social History Narrative  . Not on file    Vital Signs: Blood pressure (!) 152/98, pulse (!) 111, resp. rate 20, height 5\' 5"  (1.651 m), weight 198 lb 3.2 oz (89.9 kg), SpO2 92 %.  Examination: General Appearance: The patient is well-developed, well-nourished, and in no distress. Skin: Gross inspection of skin unremarkable. Head: normocephalic, no gross deformities. Eyes: no gross deformities noted. ENT: ears appear grossly normal no exudates. Neck: Supple. No thyromegaly. No LAD. Respiratory:   No rhonchi are noted both lungs. Cardiovascular: Normal S1 and S2 without murmur or rub. Extremities: No cyanosis. pulses are equal. Neurologic: Alert and oriented. No involuntary movements.  LABS: No results found for this or any previous visit (from  the past 2160 hour(s)).  Radiology: Ct Chest W Contrast  Result Date: 04/28/2017 CLINICAL DATA:  Cough, shortness of breath. EXAM: CT CHEST WITH CONTRAST TECHNIQUE: Multidetector CT imaging of the chest was performed during intravenous contrast administration. CONTRAST:  29mL ISOVUE-300 IOPAMIDOL (ISOVUE-300) INJECTION 61% COMPARISON:  CT scan of February 28, 2015. FINDINGS: Cardiovascular: Atherosclerosis of thoracic aorta is noted without aneurysm or dissection. Normal cardiac size. No pericardial effusion is noted. Mediastinum/Nodes: No enlarged mediastinal, hilar, or axillary lymph nodes. Thyroid gland, trachea, and esophagus demonstrate no significant findings. Lungs/Pleura: No pneumothorax or pleural effusion is noted. Stable scarring is noted in lingular segment of left upper lobe. Stable right middle lobe scarring is noted. No acute pulmonary disease is noted. Upper Abdomen: Stable bilateral adrenal adenomas are noted. Musculoskeletal: No chest wall abnormality. No acute or significant osseous findings. IMPRESSION: Stable bilateral adrenal adenomas. Stable bilateral lung scarring  is noted. No acute abnormality is noted in the chest. Aortic Atherosclerosis (ICD10-I70.0). Electronically Signed   By: Marijo Conception, M.D.   On: 04/28/2017 15:10    No results found.  No results found.    Assessment and Plan: Patient Active Problem List   Diagnosis Date Noted  . DM (diabetes mellitus), type 2 ([place]) 09/08/2016  . COPD exacerbation ([place]) 09/07/2016    1.  COPD she will continue with current regimen she is tolerating it fairly well.  Pulmonary function tests were done last November and showed her FEV1 to be 40% consistent with severe obstructive lung disease.  Again she was instructed to avoid cigarette smoke in secondhand smoke.  She will also continue with her current regimen  2. Chronic respiratory failure with hypoxia she needs to be on oxygen therapy and she will be continued on 3 L  oxygen therapy.  I have had a face-to-face discussion with her regarding her need for oxygen and she states that she will be compliant  3. Morbid obesity will continue with encouraging weight loss through dietary management 4. Obstructive sleep apnea-hypopnea syndrome we will continue with present management also as mention she needs to work on losing weight 5.  allergic rhinitis will schedule her for allergy testing for further evaluation and treatment  General Counseling: I have discussed the findings of the evaluation and examination with Thayer Headings.  I have also discussed any further diagnostic evaluation thatmay be needed or ordered today. Gerda verbalizes understanding of the findings of todays visit. We also reviewed her medications today and discussed drug interactions and side effects including but not limited excessive drowsiness and altered mental states. We also discussed that there is always a risk not just to her but also people around her. she has been encouraged to call the office with any questions or concerns that should arise related to todays visit.    Time spent: 92min  I have personally obtained a history, examined the patient, evaluated laboratory and imaging results, formulated the assessment and plan and placed orders.    [name], MD [facility] Pulmonary and Critical Care Sleep medicine

## 2017-09-10 ENCOUNTER — Telehealth: Payer: Self-pay

## 2017-09-10 NOTE — Telephone Encounter (Signed)
FAXED OXYGEN RECERTIFICATION ORDER TO [facility] WITH [facility] AND NOTE PT IS ALREADY ON OXYGEN AND ALSO SPOKE WITH STEPHANIE SHE WILL CALL ME BACK IF NEED ANYTHING ELSE

## 2017-09-15 ENCOUNTER — Institutional Professional Consult (permissible substitution): Payer: Medicare Other | Admitting: Internal Medicine

## 2017-09-15 NOTE — Progress Notes (Deleted)
Patient is a 57 year old female, she was previously seeing Dr. Yancey Flemings, for a history of COPD, morbid obesity, allergic rhinitis, obstructive sleep apnea.  Imaging personally reviewed, CT chest 04/28/17, there is hyperinflation consistent with emphysema.  There is a resolving right middle lobe infiltrate.

## 2017-10-04 ENCOUNTER — Ambulatory Visit (INDEPENDENT_AMBULATORY_CARE_PROVIDER_SITE_OTHER): Payer: Medicare Other | Admitting: Internal Medicine

## 2017-10-04 ENCOUNTER — Encounter: Payer: Self-pay | Admitting: Internal Medicine

## 2017-10-04 VITALS — BP 138/90 | HR 93 | Temp 97.8°F | Resp 16 | Ht 65.0 in | Wt 203.6 lb

## 2017-10-04 DIAGNOSIS — Z9981 Dependence on supplemental oxygen: Secondary | ICD-10-CM

## 2017-10-04 DIAGNOSIS — J9611 Chronic respiratory failure with hypoxia: Secondary | ICD-10-CM | POA: Diagnosis not present

## 2017-10-04 DIAGNOSIS — J440 Chronic obstructive pulmonary disease with acute lower respiratory infection: Secondary | ICD-10-CM | POA: Diagnosis not present

## 2017-10-04 DIAGNOSIS — J209 Acute bronchitis, unspecified: Secondary | ICD-10-CM | POA: Diagnosis not present

## 2017-10-04 DIAGNOSIS — J301 Allergic rhinitis due to pollen: Secondary | ICD-10-CM | POA: Diagnosis not present

## 2017-10-04 MED ORDER — LEVOFLOXACIN 500 MG PO TABS: 500.0000 mg | ORAL_TABLET | Freq: Every day | ORAL | 0 refills | Status: DC

## 2017-10-04 MED ORDER — PREDNISONE 10 MG (21) PO TBPK: ORAL_TABLET | ORAL | 0 refills | Status: DC

## 2017-10-04 NOTE — Progress Notes (Signed)
[facility] [place], [place] [address]  Pulmonary Sleep Medicine   Office Visit Note  Patient Name: Shelly [name] DOB: 1961/03/17 MRN [id number]  Date of Service: 10/04/2017  Complaints/HPI:  Patient was here for a allergy test however she was having cough congestion and significantly short of breath.  She has been having a flare of her COPD and has not quite gotten over the acute COPD.  She currently is not on any antibiotics has not been on any steroids.  She has no fevers or chills noted.  Denies having any chest pain.  No palpitations are noted.  She was significantly hypoxic however on evaluation.  Because of her acute symptoms it was decided not to do the allergy test today  ROS  General: (-) fever, (-) chills, (-) night sweats, (-) weakness Skin: (-) rashes, (-) itching,. Eyes: (-) visual changes, (-) redness, (-) itching. Nose and Sinuses: (-) nasal stuffiness or itchiness, (-) postnasal drip, (-) nosebleeds, (-) sinus trouble. Mouth and Throat: (-) sore throat, (-) hoarseness. Neck: (-) swollen glands, (-) enlarged thyroid, (-) neck pain. Respiratory: + cough, (-) bloody sputum, + shortness of breath, + wheezing. Cardiovascular: - ankle swelling, (-) chest pain. Lymphatic: (-) lymph node enlargement. Neurologic: (-) numbness, (-) tingling. Psychiatric: (-) anxiety, (-) depression   Current Medication: Outpatient Encounter Medications as of 10/04/2017  Medication Sig  . fluticasone furoate-vilanterol (BREO ELLIPTA) 200-25 MCG/INH AEPB Inhale 1 puff into the lungs daily.  . furosemide (LASIX) 20 MG tablet Take 20 mg by mouth daily.  Marland Kitchen ipratropium-albuterol (DUONEB) 0.5-2.5 (3) MG/3ML SOLN Take 3 mLs by nebulization every 6 (six) hours as needed.  Marland Kitchen lisinopril (PRINIVIL,ZESTRIL) 10 MG tablet Take 20 mg by mouth daily.  . metFORMIN (GLUCOPHAGE) 500 MG tablet Take 1 tablet (500 mg total) by mouth 2 (two) times daily with a meal. (Patient not taking:  Reported on 09/09/2017)  . ondansetron (ZOFRAN ODT) 4 MG disintegrating tablet Take 1 tablet (4 mg total) by mouth every 8 (eight) hours as needed for nausea or vomiting. (Patient not taking: Reported on 09/09/2017)  . oseltamivir (TAMIFLU) 75 MG capsule Take 1 capsule (75 mg total) by mouth 2 (two) times daily. (Patient not taking: Reported on 09/09/2017)  . oxyCODONE-acetaminophen (PERCOCET) 7.5-325 MG tablet Take 1 tablet by mouth every 6 (six) hours as needed for severe pain. (Patient not taking: Reported on 09/09/2017)  . predniSONE (DELTASONE) 10 MG tablet Take 50 mg taper by 10 mg daily then stop (Patient not taking: Reported on 09/09/2017)  . VENTOLIN HFA 108 (90 Base) MCG/ACT inhaler    No facility-administered encounter medications on file as of 10/04/2017.     Surgical History: Past Surgical History:  Procedure Laterality Date  . ABDOMINAL HYSTERECTOMY    . COLON SURGERY      Medical History: Past Medical History:  Diagnosis Date  . Asthma   . Diabetes mellitus without complication ([place])   . Hypertension     Family History: Family History  Adopted: Yes  Family history unknown: Yes    Social History: Social History   Socioeconomic History  . Marital status: Married    Spouse name: Not on file  . Number of children: Not on file  . Years of education: Not on file  . Highest education level: Not on file  Occupational History  . Not on file  Social Needs  . Financial resource strain: Not on file  . Food insecurity:    Worry: Not  on file    Inability: Not on file  . Transportation needs:    Medical: Not on file    Non-medical: Not on file  Tobacco Use  . Smoking status: Former Research scientist (life sciences)  . Smokeless tobacco: Never Used  Substance and Sexual Activity  . Alcohol use: No  . Drug use: No  . Sexual activity: Not on file  Lifestyle  . Physical activity:    Days per week: Not on file    Minutes per session: Not on file  . Stress: Not on file  Relationships  . Social  connections:    Talks on phone: Not on file    Gets together: Not on file    Attends religious service: Not on file    Active member of club or organization: Not on file    Attends meetings of clubs or organizations: Not on file    Relationship status: Not on file  . Intimate partner violence:    Fear of current or ex partner: Not on file    Emotionally abused: Not on file    Physically abused: Not on file    Forced sexual activity: Not on file  Other Topics Concern  . Not on file  Social History Narrative  . Not on file    Vital Signs: Blood pressure 138/90, pulse 93, temperature 97.8 F (36.6 C), temperature source Oral, resp. rate 16, height 5\' 5"  (1.651 m), weight 203 lb 9.6 oz (92.4 kg), SpO2 97 %.  Examination: General Appearance: The patient is well-developed, well-nourished, and in no distress. Skin: Gross inspection of skin unremarkable. Head: normocephalic, no gross deformities. Eyes: no gross deformities noted. ENT: ears appear grossly normal no exudates. Neck: Supple. No thyromegaly. No LAD. Respiratory: scattered rhonchi noted bilaterally. Cardiovascular: Normal S1 and S2 without murmur or rub. Extremities: No cyanosis. pulses are equal. Neurologic: Alert and oriented. No involuntary movements.  LABS: No results found for this or any previous visit (from the past 2160 hour(s)).  Radiology: Ct Chest W Contrast  Result Date: 04/28/2017 CLINICAL DATA:  Cough, shortness of breath. EXAM: CT CHEST WITH CONTRAST TECHNIQUE: Multidetector CT imaging of the chest was performed during intravenous contrast administration. CONTRAST:  87mL ISOVUE-300 IOPAMIDOL (ISOVUE-300) INJECTION 61% COMPARISON:  CT scan of February 28, 2015. FINDINGS: Cardiovascular: Atherosclerosis of thoracic aorta is noted without aneurysm or dissection. Normal cardiac size. No pericardial effusion is noted. Mediastinum/Nodes: No enlarged mediastinal, hilar, or axillary lymph nodes. Thyroid gland,  trachea, and esophagus demonstrate no significant findings. Lungs/Pleura: No pneumothorax or pleural effusion is noted. Stable scarring is noted in lingular segment of left upper lobe. Stable right middle lobe scarring is noted. No acute pulmonary disease is noted. Upper Abdomen: Stable bilateral adrenal adenomas are noted. Musculoskeletal: No chest wall abnormality. No acute or significant osseous findings. IMPRESSION: Stable bilateral adrenal adenomas. Stable bilateral lung scarring is noted. No acute abnormality is noted in the chest. Aortic Atherosclerosis (ICD10-I70.0). Electronically Signed   By: Marijo Conception, M.D.   On: 04/28/2017 15:10    No results found.  No results found.    Assessment and Plan: Patient Active Problem List   Diagnosis Date Noted  . DM (diabetes mellitus), type 2 ([place]) 09/08/2016  . COPD exacerbation ([place]) 09/07/2016  . Neck pain 04/02/2016  . Abdominal pain 01/04/2014    1. COPD mild exacerbation will place on steroids and also will give her script for levaquin 2. OSA doing well will continue with present management 3. Mobid obesity needs  to work on weight loss 4. Allergic rhinitis will continue with current medications defer allergy test for now 5. Chronic respiratory failure with hypoxia continue with 2lpm oxygen flow rate  General Counseling: I have discussed the findings of the evaluation and examination with Shelly [name].  I have also discussed any further diagnostic evaluation thatmay be needed or ordered today. Shelly [name] verbalizes understanding of the findings of todays visit. We also reviewed her medications today and discussed drug interactions and side effects including but not limited excessive drowsiness and altered mental states. We also discussed that there is always a risk not just to her but also people around her. she has been encouraged to call the office with any questions or concerns that should arise related to todays visit.    Time spent:  57min  I have personally obtained a history, examined the patient, evaluated laboratory and imaging results, formulated the assessment and plan and placed orders.    [name], MD [facility] Pulmonary and Critical Care Sleep medicine

## 2017-10-04 NOTE — Patient Instructions (Signed)

## 2017-11-08 ENCOUNTER — Ambulatory Visit: Payer: Self-pay | Admitting: Internal Medicine

## 2017-12-12 ENCOUNTER — Emergency Department: Payer: Medicare Other

## 2017-12-12 ENCOUNTER — Emergency Department
Admission: EM | Admit: 2017-12-12 | Discharge: 2017-12-12 | Disposition: A | Discharge status: Home / Self Care | Payer: Medicare Other | Attending: Emergency Medicine | Admitting: Emergency Medicine

## 2017-12-12 ENCOUNTER — Other Ambulatory Visit: Payer: Self-pay

## 2017-12-12 DIAGNOSIS — Y939 Activity, unspecified: Secondary | ICD-10-CM | POA: Insufficient documentation

## 2017-12-12 DIAGNOSIS — R0902 Hypoxemia: Secondary | ICD-10-CM | POA: Diagnosis not present

## 2017-12-12 DIAGNOSIS — J449 Chronic obstructive pulmonary disease, unspecified: Secondary | ICD-10-CM | POA: Insufficient documentation

## 2017-12-12 DIAGNOSIS — E119 Type 2 diabetes mellitus without complications: Secondary | ICD-10-CM | POA: Insufficient documentation

## 2017-12-12 DIAGNOSIS — M542 Cervicalgia: Secondary | ICD-10-CM | POA: Insufficient documentation

## 2017-12-12 DIAGNOSIS — Z87891 Personal history of nicotine dependence: Secondary | ICD-10-CM | POA: Insufficient documentation

## 2017-12-12 DIAGNOSIS — R55 Syncope and collapse: Secondary | ICD-10-CM | POA: Insufficient documentation

## 2017-12-12 DIAGNOSIS — S0990XA Unspecified injury of head, initial encounter: Secondary | ICD-10-CM | POA: Diagnosis not present

## 2017-12-12 DIAGNOSIS — W010XXA Fall on same level from slipping, tripping and stumbling without subsequent striking against object, initial encounter: Secondary | ICD-10-CM | POA: Diagnosis not present

## 2017-12-12 DIAGNOSIS — Y999 Unspecified external cause status: Secondary | ICD-10-CM | POA: Diagnosis not present

## 2017-12-12 DIAGNOSIS — Y92000 Kitchen of unspecified non-institutional (private) residence as  the place of occurrence of the external cause: Secondary | ICD-10-CM | POA: Diagnosis not present

## 2017-12-12 DIAGNOSIS — I10 Essential (primary) hypertension: Secondary | ICD-10-CM | POA: Diagnosis not present

## 2017-12-12 HISTORY — DX: Sleep apnea, unspecified: G47.30

## 2017-12-12 HISTORY — DX: Chronic obstructive pulmonary disease, unspecified: J44.9

## 2017-12-12 LAB — BASIC METABOLIC PANEL
Anion gap: 9 (ref 5–15)
BUN: 13 mg/dL (ref 6–20)
CO2: 34 mmol/L — AB (ref 22–32)
CREATININE: 0.74 mg/dL (ref 0.44–1.00)
Calcium: 9.4 mg/dL (ref 8.9–10.3)
Chloride: 96 mmol/L — ABNORMAL LOW (ref 98–111)
GFR calc non Af Amer: >60 mL/min (ref >60)
GLUCOSE: 177 mg/dL — AB (ref 70–99)
Potassium: 4 mmol/L (ref 3.5–5.1)
Sodium: 139 mmol/L (ref 135–145)

## 2017-12-12 LAB — BLOOD GAS, VENOUS
Acid-Base Excess: 9.4 mmol/L — ABNORMAL HIGH (ref 0.0–2.0)
BICARBONATE: 36.3 mmol/L — AB (ref 20.0–28.0)
O2 SAT: 81.6 %
PATIENT TEMPERATURE: 37
PO2 VEN: 45 mmHg (ref 32.0–45.0)
pCO2, Ven: 56 mmHg (ref 44.0–60.0)
pH, Ven: 7.42 (ref 7.250–7.430)

## 2017-12-12 LAB — CBC
HCT: 48.3 % — ABNORMAL HIGH (ref 35.0–47.0)
HEMOGLOBIN: 16.3 g/dL — AB (ref 12.0–16.0)
MCH: 29.9 pg (ref 26.0–34.0)
MCHC: 33.6 g/dL (ref 32.0–36.0)
MCV: 88.9 fL (ref 80.0–100.0)
Platelets: 244 10*3/uL (ref 150–440)
RBC: 5.44 MIL/uL — AB (ref 3.80–5.20)
RDW: 13.6 % (ref 11.5–14.5)
WBC: 9 10*3/uL (ref 3.6–11.0)

## 2017-12-12 LAB — TROPONIN I: Troponin I: <0.03 ng/mL (ref <0.03)

## 2017-12-12 MED ORDER — PROCHLORPERAZINE EDISYLATE 10 MG/2ML IJ SOLN
10.0000 mg | Freq: Once | INTRAMUSCULAR | Status: AC
  Administered 2017-12-12: 10 mg via INTRAVENOUS
  Filled 2017-12-12: qty 2

## 2017-12-12 MED ORDER — KETOROLAC TROMETHAMINE 30 MG/ML IJ SOLN
30.0000 mg | Freq: Once | INTRAMUSCULAR | Status: AC
  Administered 2017-12-12: 30 mg via INTRAVENOUS

## 2017-12-12 MED ORDER — KETOROLAC TROMETHAMINE 30 MG/ML IJ SOLN
INTRAMUSCULAR | Status: AC
  Administered 2017-12-12: 30 mg via INTRAVENOUS
  Filled 2017-12-12: qty 1

## 2017-12-12 MED ORDER — METHYLPREDNISOLONE SODIUM SUCC 125 MG IJ SOLR
125.0000 mg | Freq: Once | INTRAMUSCULAR | Status: AC
  Administered 2017-12-12: 125 mg via INTRAVENOUS
  Filled 2017-12-12: qty 2

## 2017-12-12 MED ORDER — IPRATROPIUM-ALBUTEROL 0.5-2.5 (3) MG/3ML IN SOLN
3.0000 mL | Freq: Once | RESPIRATORY_TRACT | Status: AC
  Administered 2017-12-12: 3 mL via RESPIRATORY_TRACT
  Filled 2017-12-12: qty 3

## 2017-12-12 NOTE — ED Notes (Signed)
Patient came to facility on room air.  This RN noticed saturation levels at 82% on room air.  Patient placed on 3 L nasal cannula and states that she normally wear O2 at home.  States that she was not wearing her O2 when she fell 3 days ago.  Patient responding well to the O2 at this time and in NAD.

## 2017-12-12 NOTE — Discharge Instructions (Addendum)
Please seek medical attention for any high fevers, chest pain, shortness of breath, change in behavior, persistent vomiting, bloody stool or any other new or concerning symptoms.  

## 2017-12-12 NOTE — ED Notes (Signed)
Warm blankets provided to pt.

## 2017-12-12 NOTE — ED Provider Notes (Signed)
Shelly [name]       Time seen: ----------------------------------------- 2:03 PM on 12/12/2017 -----------------------------------------   I have reviewed the triage vital signs and the nursing notes.  HISTORY   Chief Complaint Loss of Consciousness    HPI Shelly [name] is a 57 y.o. female with a history of asthma, COPD, diabetes, hypertension who presents to the ED for a syncopal event that occurred 3 to 4 days ago.  Patient states she was standing making coffee and had taken her oxygen off and then subsequently she fell back asleep while she was standing up.  She then fell backwards and hit her head.  Since that time she is been complaining of neck pain and headache.  She is been taking 6-9 ibuprofen daily since the fall.  She does have a history of COPD, states she has not been sleeping well.  Past Medical History:  Diagnosis Date  . Asthma   . COPD (chronic obstructive pulmonary disease) ([place])   . Diabetes mellitus without complication ([place])   . Hypertension   . Sleep apnea     Patient Active Problem List   Diagnosis Date Noted  . DM (diabetes mellitus), type 2 ([place]) 09/08/2016  . COPD exacerbation ([place]) 09/07/2016  . Neck pain 04/02/2016  . Abdominal pain 01/04/2014    Past Surgical History:  Procedure Laterality Date  . ABDOMINAL HYSTERECTOMY    . COLON SURGERY      Allergies Patient has no known allergies.  Social History Social History   Tobacco Use  . Smoking status: Former Research scientist (life sciences)  . Smokeless tobacco: Never Used  Substance Use Topics  . Alcohol use: No  . Drug use: No   Review of Systems Constitutional: Negative for fever. Cardiovascular: Negative for chest pain. Respiratory: Positive for chronic shortness of breath Gastrointestinal: Negative for abdominal pain, vomiting and diarrhea. Musculoskeletal: Positive for neck pain Skin: Negative for rash. Neurological: Positive for  headache  All systems negative/normal/unremarkable except as stated in the HPI  ____________________________________________   PHYSICAL EXAM:  VITAL SIGNS: ED Triage Vitals [12/12/17 1310]  Enc Vitals Group     BP (!) 143/91     Pulse Rate 84     Resp 20     Temp 98.7 F (37.1 C)     Temp Source Oral     SpO2 91 %     Weight 200 lb (90.7 kg)     Height 5\' 5"  (1.651 m)     Head Circumference      Peak Flow      Pain Score 10     Pain Loc      Pain Edu?      Excl. in [place]?    Constitutional: Alert and oriented.  No acute distress Eyes: Conjunctivae are normal. Normal extraocular movements. ENT   Head: Normocephalic and atraumatic.   Nose: No congestion/rhinnorhea.   Mouth/Throat: Mucous membranes are moist.   Neck: No stridor. Cardiovascular: Normal rate, regular rhythm. No murmurs, rubs, or gallops. Respiratory: Mild wheezing bilaterally Gastrointestinal: Soft and nontender. Normal bowel sounds Musculoskeletal: Nontender with normal range of motion in extremities. No lower extremity tenderness nor edema. Neurologic:  Normal speech and language. No gross focal neurologic deficits are appreciated.  Skin:  Skin is warm, dry and intact. No rash noted. Psychiatric: Mood and affect are normal. Speech and behavior are normal.  ____________________________________________  EKG: Interpreted by me.  Sinus rhythm rate of 88 bpm, rightward axis, possible septal  infarct age-indeterminate, normal QT  ____________________________________________  ED COURSE:  As part of my medical decision making, I reviewed the following data within the [place] History obtained from family if available, nursing notes, old chart and ekg, as well as notes from prior ED visits. Patient presented for syncope with headache and neck pain, we will assess with labs and imaging as indicated at this time.   Procedures ____________________________________________   LABS  (pertinent positives/negatives)  Labs Reviewed  BASIC METABOLIC PANEL - Abnormal; Notable for the following components:      Result Value   Chloride 96 (*)    CO2 34 (*)    Glucose, Bld 177 (*)    All other components within normal limits  CBC - Abnormal; Notable for the following components:   RBC 5.44 (*)    Hemoglobin 16.3 (*)    HCT 48.3 (*)    All other components within normal limits  URINALYSIS, COMPLETE (UACMP) WITH MICROSCOPIC  BLOOD GAS, VENOUS  TROPONIN I    RADIOLOGY  CT head, C-spine does not reveal any acute process  ____________________________________________  DIFFERENTIAL DIAGNOSIS   Contusion, cervical strain, subdural, fracture, hypoxia, hypercarbia, COPD  FINAL ASSESSMENT AND PLAN  Fall, minor head injury, hypoxia   Plan: The patient had presented for syncope likely secondary to hypoxia. Patient's labs are still pending at this time. Patient's imaging not reveal any acute process.   Laurence Aly, MD   [name]: This [name] was generated in part or whole with voice recognition software. Voice recognition is usually quite accurate but there are transcription errors that can and very often do occur. I apologize for any typographical errors that were not detected and corrected.     Earleen Newport, MD 12/12/17 [phone]

## 2017-12-12 NOTE — ED Triage Notes (Signed)
Pt states she was making coffee about 3-4 days ago and "I think I just feel back to sleep" states she fell backwards hitting her head. Pt c/o neck pain and HA.Marland Kitchen Pt is a/ox4 on arrival. States she has been taking 6-9 IBU daily since the fall.

## 2018-01-05 ENCOUNTER — Telehealth: Payer: Self-pay

## 2018-01-05 NOTE — Telephone Encounter (Signed)
refaxed to Shelly [name] [phone] with correct information

## 2018-01-26 ENCOUNTER — Ambulatory Visit: Payer: Medicare Other | Attending: Physician Assistant | Admitting: Physical Therapy

## 2018-01-26 ENCOUNTER — Encounter: Payer: Self-pay | Admitting: Physical Therapy

## 2018-01-26 ENCOUNTER — Other Ambulatory Visit: Payer: Self-pay

## 2018-01-26 DIAGNOSIS — G8929 Other chronic pain: Secondary | ICD-10-CM | POA: Insufficient documentation

## 2018-01-26 DIAGNOSIS — M542 Cervicalgia: Secondary | ICD-10-CM | POA: Diagnosis not present

## 2018-01-26 DIAGNOSIS — R262 Difficulty in walking, not elsewhere classified: Secondary | ICD-10-CM | POA: Diagnosis present

## 2018-01-26 DIAGNOSIS — R293 Abnormal posture: Secondary | ICD-10-CM | POA: Diagnosis present

## 2018-01-26 DIAGNOSIS — M545 Low back pain: Secondary | ICD-10-CM | POA: Diagnosis present

## 2018-01-26 NOTE — Therapy (Addendum)
[place] MAIN [facility] SERVICES [address] [place], [place], [address] Phone: [phone]   Fax:  [phone]  Physical Therapy Evaluation  Patient Details  Name: Shelly [name] MRN: [id number] Date of Birth: 1961/04/02 Referring Provider: Wyatt Portela (Dr. Louisa Second)   Encounter Date: 01/26/2018  PT End of Session - 01/26/18 1225    Visit Number  1    Number of Visits  13    Date for PT Re-Evaluation  03/09/18    Authorization Type  progress note 1/10, begin reporting period 01/26/2018    PT Start Time  1100    PT Stop Time  1200    PT Time Calculation (min)  60 min    Activity Tolerance  Patient tolerated treatment well;Patient limited by pain    Behavior During Therapy  [facility] for tasks assessed/performed       Past Medical History:  Diagnosis Date  . Asthma   . COPD (chronic obstructive pulmonary disease) ([place])   . Diabetes mellitus without complication ([place])   . Hypertension   . Sleep apnea     Past Surgical History:  Procedure Laterality Date  . ABDOMINAL HYSTERECTOMY    . COLON SURGERY      There were no vitals filed for this visit.   Subjective Assessment - 01/26/18 1107    Subjective  Patient reports she was getting out of the bath tub, and when she went to step over, she slipped and went across the floor. When she went over the edge of the tub, she did something to the back of her neck. Pt had x-ray of her neck and found bone spur on cervical vertebra; doctor's believe her fall aggravated the bone spur and what's going on in her neck.     Pertinent History  Patient is a 57 yo female with significant PMH of asthma, COPD, diabetes, and hypertension and presents to PT today with complaints of neck pain. Pt had a fall 12/24/17 getting out of bathtub and has had severe neck pain ever since. Patient is having difficulty sleeping and gets no relief from tramadol or muscle relaxer medication. Pt's orthopedist is having a bone specialist  coming to see her in [place] to evaluate bone spur and neck pain further. Referred to PT to try to get some relief before seeing the bone specialist.    Limitations  Sitting;Reading;Lifting;Standing;Walking;House hold activities    How long can you sit comfortably?  Hours but pain increases    How long can you stand comfortably?  10 minutes    How long can you walk comfortably?  Walk around the house    Diagnostic tests  X-ray showed bone spur on vertebra in neck    Patient Stated Goals  To get rid of the pain     Currently in Pain?  Yes    Pain Score  10-Worst pain ever    Pain Location  Neck    Pain Orientation  Posterior    Pain Descriptors / Indicators  Tightness;Throbbing;Tender    Pain Type  Chronic pain    Pain Radiating Towards  sometimes radiates into the top of L arm    Pain Onset  More than a month ago    Pain Frequency  Constant    Aggravating Factors   laying on a pillow    Pain Relieving Factors  heat, hot water massage with showerhead, medication    Effect of Pain on Daily Activities  difficulty with cooking/cleaning, trouble  sleeping due to pain    Multiple Pain Sites  No         [facility] PT Assessment - 01/26/18 0001      Assessment   Medical Diagnosis  Neck pain    Referring Provider  Wyatt Portela (Dr. Louisa Second)    Onset Date/Surgical Date  12/24/17    Hand Dominance  Right    Next MD Visit  02/06/2018    Prior Therapy  None for neck pain      Precautions   Precautions  Fall      Restrictions   Weight Bearing Restrictions  No      Balance Screen   Has the patient fallen in the past 6 months  Yes    How many times?  1    Has the patient had a decrease in activity level because of a fear of falling?   No    Is the patient reluctant to leave their home because of a fear of falling?   No      Home Film/video editor residence    Living Arrangements  Spouse/significant other    Available Help at Discharge  Family    Type of [place] to enter    Entrance Stairs-Number of Steps  4    Entrance Stairs-Rails  Can reach both    [place]  One level    [place] bars - tub/shower      Prior Function   Level of Independence  Independent    Vocation  On disability    Leisure  Go to [employer], raise puppies, crafting, visiting hospice      Cognition   Overall Cognitive Status  Within Functional Limits for tasks assessed      Observation/Other Assessments   Neck Disability Index   54%      Sensation   Light Touch  Appears Intact      Posture/Postural Control   Posture Comments  forward head, rounded shoulder, flexed neck posture/looking down      AROM   Cervical Flexion  24    Cervical Extension  14    Cervical - Right Side Bend  13    Cervical - Left Side Bend  22    Cervical - Right Rotation  34    Cervical - Left Rotation  38      Transfers   Transfers  Independent with all Transfers      Ambulation/Gait   Gait Comments  patient ambulates independently;       High Level Balance   High Level Balance Comments  static standing balance is fair, dynamic standing balance is fair;          Increased tenderness in cervical paraspinals, suboccipitals, and upper trapezius; unable to perform mobility testing or deep palpation due to severe pain (10/10).       Objective measurements completed on examination: See above findings.              PT Education - 01/26/18 1123    Education provided  Yes    Education Details  plan of care, recommendations    Person(s) Educated  Patient    Methods  Explanation    Comprehension  Verbalized understanding       PT Short Term Goals - 01/26/18 1251      PT SHORT TERM GOAL #1   Title  Patient  will be independent in home exercise program to improve strength/mobility for better functional independence with ADLs.    Time  3    Period  Weeks    Status  New    Target Date  02/16/18        PT Long Term Goals -  01/26/18 1254      PT LONG TERM GOAL #1   Title  Patient will report worst pain of less than 5/10 to demonstrate improved tolerance for ADLs.     Baseline  8/21: 10/10    Time  6    Period  Weeks    Status  New    Target Date  03/09/18      PT LONG TERM GOAL #2   Title  Patient will report NDI of less than 45% disability to demonstrate improved tolerance for ADLs and improved mobility.    Baseline  8/21: 54%    Time  6    Period  Weeks    Status  New    Target Date  03/09/18      PT LONG TERM GOAL #3   Title  Patient will improve AROM flexion 40 deg, extension 40 deg, B lateral flexion 30 deg, and B rotation 60 deg pain free to demonstrate improved mobility for improved tolerance for ADLs.    Baseline  8/21: flexion 24 deg, ext 14 deg, R side bend 13 deg, L side bend 22 deg, R rot 34 deg, L rot 38 deg    Time  6    Period  Weeks    Status  New    Target Date  03/09/18             Plan - 01/26/18 1232    Clinical Impression Statement  Jonni is a 57 yo female with chief complaint of neck pain radiating into the upper shoulder area more on the R than L. Pt presented with flexed neck posture and forward head with rounded shoulders; wincing facial expressions due to severe pain. Pt demonstrated decreased ROM in all planes of motion with increases in pain with ROM. Pt extremely tender to palpation and unable to perform suboccipital release for assessment due to pain; increased tenderness noted in B cervical paraspinals and upper trapezius with complaints of headaches at the base of her skull. Pt never has pain decrease below a 5/10 with heat and often experiences 10/10 pain with any movement. Pt would benefit from skilled PT intervention for improvements in neck pain and mobility.    History and Personal Factors relevant to plan of care:  (+) motivated, young in age, no history of neck pain, no history of falls (-) comorbidities, extremely severe pain for 5 weeks, minimal relief     Clinical Presentation  Evolving    Clinical Presentation due to:  no history of neck pain or falls, varying levels of mobility and function, and extremely severe pain with minimal relief    Clinical Decision Making  Moderate    Rehab Potential  Fair    Clinical Impairments Affecting Rehab Potential  severe pain, minimal pain relief, varying levels of mobility, chronicity of neck pain    PT Frequency  2x / week    PT Duration  6 weeks    PT Treatment/Interventions  Biofeedback;Cryotherapy;Electrical Stimulation;Moist Heat;Traction;Ultrasound;Therapeutic activities;Therapeutic exercise;Patient/family education;Manual techniques;Passive range of motion;Dry needling;Energy conservation;Taping    PT Next Visit Plan  TENS unit, ultrasound, heat, pain relief techniques    PT Home Exercise Plan  give  at next visit    Consulted and Agree with Plan of Care  Patient       Patient will benefit from skilled therapeutic intervention in order to improve the following deficits and impairments:  Decreased range of motion, Decreased mobility, Decreased strength, Hypomobility, Increased muscle spasms, Postural dysfunction, Pain  Visit Diagnosis: Cervicalgia  Abnormal posture     Problem List Patient Active Problem List   Diagnosis Date Noted  . DM (diabetes mellitus), type 2 ([place]) 09/08/2016  . COPD exacerbation ([place]) 09/07/2016  . Neck pain 04/02/2016  . Abdominal pain 01/04/2014   Harriet Masson, SPT This entire session was performed under direct supervision and direction of a licensed therapist/therapist assistant . I have personally read, edited and approve of the note as written.  Trotter,Margaret PT, DPT 01/26/2018, 5:13 PM  [place] MAIN [facility] SERVICES [address] [place], [place], [address] Phone: [phone]   Fax:  [phone]  Name: JACOBY RITSEMA MRN: [id number] Date of Birth: May 08, 1961

## 2018-01-26 NOTE — Addendum Note (Signed)
Addended by: Blanche East E on: 01/26/2018 05:15 PM   Modules accepted: Orders

## 2018-01-31 ENCOUNTER — Encounter: Payer: Self-pay | Admitting: Physical Therapy

## 2018-01-31 ENCOUNTER — Ambulatory Visit: Payer: Medicare Other | Admitting: Physical Therapy

## 2018-01-31 DIAGNOSIS — R293 Abnormal posture: Secondary | ICD-10-CM

## 2018-01-31 DIAGNOSIS — M542 Cervicalgia: Secondary | ICD-10-CM | POA: Diagnosis not present

## 2018-01-31 NOTE — Therapy (Addendum)
[place] MAIN [facility] SERVICES [address] [place], [place], [address] Phone: [phone]   Fax:  [phone]  Physical Therapy Treatment  Patient Details  Name: Shelly [name] MRN: [id number] Date of Birth: [birth year] Referring Provider: Wyatt Portela (Dr. Louisa Second)   Encounter Date: 01/31/2018  PT End of Session - 01/31/18 1207    Visit Number  2    Number of Visits  13    Date for PT Re-Evaluation  03/09/18    Authorization Type  progress note 2/10, begin reporting period 01/26/2018    PT Start Time  1146    PT Stop Time  1230    PT Time Calculation (min)  44 min    Activity Tolerance  Patient tolerated treatment well;Patient limited by pain    Behavior During Therapy  [facility] for tasks assessed/performed       Past Medical History:  Diagnosis Date  . Asthma   . COPD (chronic obstructive pulmonary disease) ([place])   . Diabetes mellitus without complication ([place])   . Hypertension   . Sleep apnea     Past Surgical History:  Procedure Laterality Date  . ABDOMINAL HYSTERECTOMY    . COLON SURGERY      There were no vitals filed for this visit.  Subjective Assessment - 01/31/18 1203    Subjective  Patient reportrs her pain is about the same; did see the bone specialist last week and was given pain medication and recommendation to continue with PT. Pt reports she is going to see orthopedist Friday about her wrist to see if it's healing appropriately.     Pertinent History  Patient is a 57 yo female with significant PMH of asthma, COPD, diabetes, and hypertension and presents to PT today with complaints of neck pain. Pt had a fall 12/24/17 getting out of bathtub and has had severe neck pain ever since. Patient is having difficulty sleeping and gets no relief from tramadol or muscle relaxer medication. Pt's orthopedist is having a bone specialist coming to see her in [place] to evaluate bone spur and neck pain further. Referred to PT to try to  get some relief before seeing the bone specialist.    Limitations  Sitting;Reading;Lifting;Standing;Walking;House hold activities    How long can you sit comfortably?  Hours but pain increases    How long can you stand comfortably?  10 minutes    How long can you walk comfortably?  Walk around the house    Diagnostic tests  X-ray showed bone spur on vertebra in neck    Patient Stated Goals  To get rid of the pain     Currently in Pain?  Yes    Pain Score  8     Pain Location  Neck    Pain Orientation  Posterior    Pain Descriptors / Indicators  Throbbing;Tightness;Tender    Pain Type  Chronic pain    Pain Onset  More than a month ago    Pain Frequency  Constant    Aggravating Factors   laying on a pillow    Pain Relieving Factors  heat, hot water massage with showerhead, medication    Effect of Pain on Daily Activities  difficulty with cooking/cleaning, trouble sleeping due to pain        Treatment E-stim with pad placement on R cervical paraspinals and R upper trap, output/intensity at 5 x15 min with moist heat on neck for pain relief  AROM in supine: -L and  R rotation x1 min x2 bouts, VCs to avoid increased pain with ROM and to note increases in pain and pain locations -Flexion x1 min x2 bouts, VCs to avoid pain but to try to feel light stretch in posterior neck  Pt required rest breaks between bouts and ROM directions due to pain  Manual therapy: -Gentle distraction x15 sec bouts x3 bouts; VCs to remain relaxed; pt unable to tolerate gentle distraction for greater than 15 sec due to increases in pain             PT Education - 01/31/18 1206    Education provided  Yes    Education Details  pain relief, gentle ROM    Person(s) Educated  Patient    Methods  Explanation;Demonstration;Verbal cues    Comprehension  Verbalized understanding;Returned demonstration;Verbal cues required;Need further instruction       PT Short Term Goals - 01/26/18 1251      PT SHORT  TERM GOAL #1   Title  Patient will be independent in home exercise program to improve strength/mobility for better functional independence with ADLs.    Time  3    Period  Weeks    Status  New    Target Date  02/16/18        PT Long Term Goals - 01/26/18 1254      PT LONG TERM GOAL #1   Title  Patient will report worst pain of less than 5/10 to demonstrate improved tolerance for ADLs.     Baseline  8/21: 10/10    Time  6    Period  Weeks    Status  New    Target Date  03/09/18      PT LONG TERM GOAL #2   Title  Patient will report NDI of less than 45% disability to demonstrate improved tolerance for ADLs and improved mobility.    Baseline  8/21: 54%    Time  6    Period  Weeks    Status  New    Target Date  03/09/18      PT LONG TERM GOAL #3   Title  Patient will improve AROM flexion 40 deg, extension 40 deg, B lateral flexion 30 deg, and B rotation 60 deg pain free to demonstrate improved mobility for improved tolerance for ADLs.    Baseline  8/21: flexion 24 deg, ext 14 deg, R side bend 13 deg, L side bend 22 deg, R rot 34 deg, L rot 38 deg    Time  6    Period  Weeks    Status  New    Target Date  03/09/18            Plan - 01/31/18 1243    Clinical Impression Statement  Patient tolerated therapy session well. Pt participated in TENS treatment to R cervical paraspinal region with moist heat to area x15 min; reported decreased discomfort during treatment but did not report any improvements in pain following treatment. Pt performed gentle AROM of rotation to L and R as well as flexion; reported increased pain with motion and stated feeling like it would help to pull head off her neck. PT performed very gentle, light distraction with support at suboccipital region x15 sec bouts x3 bouts; unable to tolerate distraction for more than 15 sec at a time due to increased pain. Pt reported decreased pain during distraction but increaesd pain at the end of each bout and no  carryover following release of distraction.  Pt will benefit from continued skilled PT intervention for improvements in neck pain and mobility.     Rehab Potential  Fair    Clinical Impairments Affecting Rehab Potential  severe pain, minimal pain relief, varying levels of mobility, chronicity of neck pain    PT Frequency  2x / week    PT Duration  6 weeks    PT Treatment/Interventions  Biofeedback;Cryotherapy;Electrical Stimulation;Moist Heat;Traction;Ultrasound;Therapeutic activities;Therapeutic exercise;Patient/family education;Manual techniques;Passive range of motion;Dry needling;Energy conservation;Taping    PT Next Visit Plan  TENS unit, ultrasound, heat, pain relief techniques    PT Home Exercise Plan  give at next visit    Consulted and Agree with Plan of Care  Patient       Patient will benefit from skilled therapeutic intervention in order to improve the following deficits and impairments:  Decreased range of motion, Decreased mobility, Decreased strength, Hypomobility, Increased muscle spasms, Postural dysfunction, Pain  Visit Diagnosis: Cervicalgia  Abnormal posture     Problem List Patient Active Problem List   Diagnosis Date Noted  . DM (diabetes mellitus), type 2 ([place]) 09/08/2016  . COPD exacerbation ([place]) 09/07/2016  . Neck pain 04/02/2016  . Abdominal pain 01/04/2014   Harriet Masson, SPT This entire session was performed under direct supervision and direction of a licensed therapist/therapist assistant . I have personally read, edited and approve of the note as written.  Trotter,Margaret PT, DPT 01/31/2018, 3:47 PM  [place] MAIN [facility] SERVICES [address] [place], [place], [address] Phone: [phone]   Fax:  [phone]  Name: Shelly [name] MRN: [id number] Date of Birth: 1961-04-29

## 2018-02-02 ENCOUNTER — Ambulatory Visit: Payer: Medicare Other | Admitting: Physical Therapy

## 2018-02-03 ENCOUNTER — Ambulatory Visit: Payer: Medicare Other | Admitting: Physical Therapy

## 2018-02-03 ENCOUNTER — Encounter: Payer: Self-pay | Admitting: Physical Therapy

## 2018-02-03 DIAGNOSIS — M542 Cervicalgia: Secondary | ICD-10-CM

## 2018-02-03 DIAGNOSIS — R293 Abnormal posture: Secondary | ICD-10-CM

## 2018-02-03 DIAGNOSIS — G8929 Other chronic pain: Secondary | ICD-10-CM

## 2018-02-03 DIAGNOSIS — R262 Difficulty in walking, not elsewhere classified: Secondary | ICD-10-CM

## 2018-02-03 DIAGNOSIS — M545 Low back pain: Secondary | ICD-10-CM

## 2018-02-03 NOTE — Therapy (Signed)
[place] MAIN [facility] SERVICES [address] [place], [place], [address] Phone: [phone]   Fax:  [phone]  Physical Therapy Treatment  Patient Details  Name: Shelly [name] MRN: [id number] Date of Birth: Sep 09, 1960 Referring Provider: Wyatt Portela (Dr. Louisa Second)   Encounter Date: 02/03/2018  PT End of Session - 02/03/18 1133    Visit Number  3    Number of Visits  13    Date for PT Re-Evaluation  03/09/18    Authorization Type  progress note 3/10 begin reporting period 01/26/2018    PT Start Time  1106    PT Stop Time  1145    PT Time Calculation (min)  39 min    Activity Tolerance  Patient tolerated treatment well;Patient limited by pain    Behavior During Therapy  [facility] for tasks assessed/performed       Past Medical History:  Diagnosis Date  . Asthma   . COPD (chronic obstructive pulmonary disease) ([place])   . Diabetes mellitus without complication ([place])   . Hypertension   . Sleep apnea     Past Surgical History:  Procedure Laterality Date  . ABDOMINAL HYSTERECTOMY    . COLON SURGERY      There were no vitals filed for this visit.  Subjective Assessment - 02/03/18 1131    Subjective  Pt reports no change in pain; she states, "I was so sore after last session that I just didn't know what to do." She reports waking up at night with increased pain having to get in the hot shower for relief;     Pertinent History  Patient is a 57 yo female with significant PMH of asthma, COPD, diabetes, and hypertension and presents to PT today with complaints of neck pain. Pt had a fall 12/24/17 getting out of bathtub and has had severe neck pain ever since. Patient is having difficulty sleeping and gets no relief from tramadol or muscle relaxer medication. Pt's orthopedist is having a bone specialist coming to see her in [place] to evaluate bone spur and neck pain further. Referred to PT to try to get some relief before seeing the bone specialist.     Limitations  Sitting;Reading;Lifting;Standing;Walking;House hold activities    How long can you sit comfortably?  Hours but pain increases    How long can you stand comfortably?  10 minutes    How long can you walk comfortably?  Walk around the house    Diagnostic tests  X-ray showed bone spur on vertebra in neck    Patient Stated Goals  To get rid of the pain     Currently in Pain?  Yes    Pain Score  8     Pain Location  Neck    Pain Orientation  Posterior;Right    Pain Descriptors / Indicators  Throbbing;Tender    Pain Type  Chronic pain    Pain Onset  More than a month ago    Pain Frequency  Constant    Aggravating Factors   prolonged sitting/standing; worse at night during sleep    Pain Relieving Factors  heat/medication    Effect of Pain on Daily Activities  decreased tolerance with ADLs;     Multiple Pain Sites  No          TREATMENT: Patient hooklying: PT applied moist heat to cervical spine in hoolying concurrent with Interferential TENS to right cervical paraspinals x20 min at tolerated intensity (#5) Patient reports significant reduction in  pain during TENs actually being able to fall asleep during treatment with restful response;  Instructed patient in postural strengtheing: Seated: Scapular retraction x10 reps; Posterior shoulder rolls x10 reps Patient required min-moderate verbal/tactile cues for correct exercise technique including cues to avoid shoulder elevation with scapular retraction;  Instructed patient to sit with cervical spine supported using towel roll as needed when sitting in a recliner;                      PT Education - 02/03/18 1133    Education provided  Yes    Education Details  TENs, postural strengthening;     Person(s) Educated  Patient    Methods  Explanation;Demonstration;Verbal cues    Comprehension  Verbalized understanding;Returned demonstration;Verbal cues required;Need further instruction       PT Short Term  Goals - 01/26/18 1251      PT [place] #1   Title  Patient will be independent in home exercise program to improve strength/mobility for better functional independence with ADLs.    Time  3    Period  Weeks    Status  New    Target Date  02/16/18        PT Long Term Goals - 01/26/18 1254      PT LONG TERM GOAL #1   Title  Patient will report worst pain of less than 5/10 to demonstrate improved tolerance for ADLs.     Baseline  8/21: 10/10    Time  6    Period  Weeks    Status  New    Target Date  03/09/18      PT LONG TERM GOAL #2   Title  Patient will report NDI of less than 45% disability to demonstrate improved tolerance for ADLs and improved mobility.    Baseline  8/21: 54%    Time  6    Period  Weeks    Status  New    Target Date  03/09/18      PT LONG TERM GOAL #3   Title  Patient will improve AROM flexion 40 deg, extension 40 deg, B lateral flexion 30 deg, and B rotation 60 deg pain free to demonstrate improved mobility for improved tolerance for ADLs.    Baseline  8/21: flexion 24 deg, ext 14 deg, R side bend 13 deg, L side bend 22 deg, R rot 34 deg, L rot 38 deg    Time  6    Period  Weeks    Status  New    Target Date  03/09/18            Plan - 02/03/18 1256    Clinical Impression Statement  Patient tolerated session well; She was able to relax well with TENs reporting less discomfort. Instructed patient in postural strengthening exercise in sitting. Patient required min VCS for correct positioning and to reduce compensation with less shoulder elevation. Patient reports increased neck pain following exercise stating, "It doesn't matter what I do, I just start hurting" She would benefit from additional skilled PT Intervention to improve postural control and reduce neck pain;     Rehab Potential  Fair    Clinical Impairments Affecting Rehab Potential  severe pain, minimal pain relief, varying levels of mobility, chronicity of neck pain    PT Frequency   2x / week    PT Duration  6 weeks    PT Treatment/Interventions  Biofeedback;Cryotherapy;Electrical Stimulation;Moist Heat;Traction;Ultrasound;Therapeutic activities;Therapeutic exercise;Patient/family education;Manual  techniques;Passive range of motion;Dry needling;Energy conservation;Taping    PT Next Visit Plan  TENS unit, ultrasound, heat, pain relief techniques    PT Home Exercise Plan  give at next visit    Consulted and Agree with Plan of Care  Patient       Patient will benefit from skilled therapeutic intervention in order to improve the following deficits and impairments:  Decreased range of motion, Decreased mobility, Decreased strength, Hypomobility, Increased muscle spasms, Postural dysfunction, Pain  Visit Diagnosis: Cervicalgia  Abnormal posture  Difficulty in walking, not elsewhere classified  Chronic midline low back pain without sciatica     Problem List Patient Active Problem List   Diagnosis Date Noted  . DM (diabetes mellitus), type 2 ([place]) 09/08/2016  . COPD exacerbation ([place]) 09/07/2016  . Neck pain 04/02/2016  . Abdominal pain 01/04/2014    Trotter,Margaret PT, DPT 02/03/2018, 12:58 PM  [place] [facility] MAIN [facility] SERVICES [address] [place], [place], [address] Phone: [phone]   Fax:  [phone]  Name: Shelly [name] MRN: [id number] Date of Birth: [birth year]

## 2018-02-03 NOTE — Patient Instructions (Signed)
Access Code: [email or web address]  URL: [email or web address]  Date: 02/03/2018  Prepared by: Blanche East   Exercises  Seated Scapular Retraction - 10 reps - 2 sets - 1x daily - 7x weekly  Seated Shoulder Rolls - 10 reps - 2 sets - 1x daily - 7x weekly

## 2018-02-09 ENCOUNTER — Encounter: Payer: Self-pay | Admitting: Physical Therapy

## 2018-02-09 ENCOUNTER — Ambulatory Visit: Payer: Medicare Other | Attending: Physician Assistant | Admitting: Physical Therapy

## 2018-02-09 DIAGNOSIS — M545 Low back pain: Secondary | ICD-10-CM | POA: Diagnosis present

## 2018-02-09 DIAGNOSIS — R293 Abnormal posture: Secondary | ICD-10-CM | POA: Insufficient documentation

## 2018-02-09 DIAGNOSIS — R262 Difficulty in walking, not elsewhere classified: Secondary | ICD-10-CM | POA: Insufficient documentation

## 2018-02-09 DIAGNOSIS — M542 Cervicalgia: Secondary | ICD-10-CM | POA: Insufficient documentation

## 2018-02-09 DIAGNOSIS — G8929 Other chronic pain: Secondary | ICD-10-CM | POA: Diagnosis present

## 2018-02-09 NOTE — Therapy (Signed)
[place] MAIN [facility] SERVICES [address] [place], [place], [address] Phone: [phone]   Fax:  [phone]  Physical Therapy Treatment  Patient Details  Name: Shelly [name] MRN: [id number] Date of Birth: 1961/03/24 Referring Provider: Wyatt Portela (Dr. Louisa Second)   Encounter Date: 02/09/2018  PT End of Session - 02/09/18 1151    Visit Number  4    Number of Visits  13    Date for PT Re-Evaluation  03/09/18    Authorization Type  progress note 4/10 begin reporting period 01/26/2018    PT Start Time  1146    PT Stop Time  1230    PT Time Calculation (min)  44 min    Activity Tolerance  Patient tolerated treatment well;Patient limited by pain    Behavior During Therapy  [facility] for tasks assessed/performed       Past Medical History:  Diagnosis Date  . Asthma   . COPD (chronic obstructive pulmonary disease) ([place])   . Diabetes mellitus without complication ([place])   . Hypertension   . Sleep apnea     Past Surgical History:  Procedure Laterality Date  . ABDOMINAL HYSTERECTOMY    . COLON SURGERY      There were no vitals filed for this visit.  Subjective Assessment - 02/09/18 1149    Subjective  Patient reports no change in neck pain; She reports adherence to stretches but reports no change in neck pain; She went to orthpedic and he said that her hand is not healing and she is being referred to bone specialist;     Pertinent History  Patient is a 57 yo female with significant PMH of asthma, COPD, diabetes, and hypertension and presents to PT today with complaints of neck pain. Pt had a fall 12/24/17 getting out of bathtub and has had severe neck pain ever since. Patient is having difficulty sleeping and gets no relief from tramadol or muscle relaxer medication. Pt's orthopedist is having a bone specialist coming to see her in [place] to evaluate bone spur and neck pain further. Referred to PT to try to get some relief before seeing the bone  specialist.    Limitations  Sitting;Reading;Lifting;Standing;Walking;House hold activities    How long can you sit comfortably?  Hours but pain increases    How long can you stand comfortably?  10 minutes    How long can you walk comfortably?  Walk around the house    Diagnostic tests  X-ray showed bone spur on vertebra in neck    Patient Stated Goals  To get rid of the pain     Currently in Pain?  Yes    Pain Score  8     Pain Location  Neck    Pain Orientation  Right;Posterior    Pain Descriptors / Indicators  Throbbing;Tender    Pain Type  Chronic pain    Pain Onset  More than a month ago    Pain Frequency  Constant    Aggravating Factors   prolonged sitting; lying down    Pain Relieving Factors  heat/ medication    Effect of Pain on Daily Activities  decreased tolerance with ADLs;     Multiple Pain Sites  No         TREATMENT: Patient sitting in chair: PT performed US to right cervical paraspinals, continuous, 2.0 watts per centimeter squared, x12 min in sitting; Patient tolerated well reporting slight reduction in neck pain with [place].  Following  [place], PT performed soft tissue massage to right cervical paraspinals including cross friction, myofascial release and ischemic trigger point release to upper trap and levator scapulae x24 min. Patient tolerate well with less compensation and less discomfort during treatment. She reports, "The massage does hurt some, but its not as bad. I can take it, anything to make this better."  Patient does exhibit some trigger points in upper trap and levator scapulae. She also exhibits significant tightness in suboccipitals on right side. Patient reports slightly less pain following manual therapy; throughout session, patient exhibits slumped posture with significant forward head; Required cues to improve erect head and posture for better head control and less pain;  PT applied kinesiotape to right cervical paraspinals (Y) pattern to improve support and  reduce pain with erect posture; Educated patient on safe tape wear;                       PT Education - 02/09/18 1151    Education provided  Yes    Education Details  postural strengthening, stretch;     Person(s) Educated  Patient    Methods  Explanation;Demonstration;Verbal cues    Comprehension  Verbalized understanding;Returned demonstration;Verbal cues required;Need further instruction       PT Short Term Goals - 01/26/18 1251      PT SHORT TERM GO[place] #1   Title  Patient will be independent in home exercise program to improve strength/mobility for better functional independence with ADLs.    Time  3    Period  Weeks    Status  New    Target Date  02/16/18        PT Long Term Goals - 01/26/18 1254      PT LONG TERM GOAL #1   Title  Patient will report worst pain of less than 5/10 to demonstrate improved tolerance for ADLs.     Baseline  8/21: 10/10    Time  6    Period  Weeks    Status  New    Target Date  03/09/18      PT LONG TERM GOAL #2   Title  Patient will report NDI of less than 45% disability to demonstrate improved tolerance for ADLs and improved mobility.    Baseline  8/21: 54%    Time  6    Period  Weeks    Status  New    Target Date  03/09/18      PT LONG TERM GOAL #3   Title  Patient will improve AROM flexion 40 deg, extension 40 deg, B lateral flexion 30 deg, and B rotation 60 deg pain free to demonstrate improved mobility for improved tolerance for ADLs.    Baseline  8/21: flexion 24 deg, ext 14 deg, R side bend 13 deg, L side bend 22 deg, R rot 34 deg, L rot 38 deg    Time  6    Period  Weeks    Status  New    Target Date  03/09/18            Plan - 02/09/18 1332    Clinical Impression Statement  Patient tolerated session well; PT performed ultrasound to help reduce tightness and reduce pain to right cervical paraspinals. Patient tolerated well; She was able to tolerate increased manual therapy including ischemic trigger  point release as well as myofascial release to right cervical paraspinals. Patient had significant difficulty keeping cervical spine in erect posture while sitting due  to weakness and fatigue. Finished session with tape to right cervical paraspinals to support upper trap and levator scapulae with kinesiotape. Patient would benefit from additional skilled PT Intervention to improve tissue extensibility and reduce neck pain;     Rehab Potential  Fair    Clinical Impairments Affecting Rehab Potential  severe pain, minimal pain relief, varying levels of mobility, chronicity of neck pain    PT Frequency  2x / week    PT Duration  6 weeks    PT Treatment/Interventions  Biofeedback;Cryotherapy;Electrical Stimulation;Moist Heat;Traction;Ultrasound;Therapeutic activities;Therapeutic exercise;Patient/family education;Manual techniques;Passive range of motion;Dry needling;Energy conservation;Taping    PT Next Visit Plan  TENS unit, ultrasound, heat, pain relief techniques    PT Home Exercise Plan  give at next visit    Consulted and Agree with Plan of Care  Patient       Patient will benefit from skilled therapeutic intervention in order to improve the following deficits and impairments:  Decreased range of motion, Decreased mobility, Decreased strength, Hypomobility, Increased muscle spasms, Postural dysfunction, Pain  Visit Diagnosis: Cervicalgia  Abnormal posture  Difficulty in walking, not elsewhere classified     Problem List Patient Active Problem List   Diagnosis Date Noted  . DM (diabetes mellitus), type 2 ([place]) 09/08/2016  . COPD exacerbation ([place]) 09/07/2016  . Neck pain 04/02/2016  . Abdominal pain 01/04/2014    Trotter,Margaret PT, DPT 02/09/2018, 1:56 PM  [place] MAIN [facility] SERVICES [address] [place], [place], [address] Phone: [phone]   Fax:  [phone]  Name: Shelly [name] MRN: [id number] Date of Birth: [birth year]

## 2018-02-15 ENCOUNTER — Ambulatory Visit: Payer: Medicare Other | Admitting: Physical Therapy

## 2018-02-15 ENCOUNTER — Encounter: Payer: Self-pay | Admitting: Physical Therapy

## 2018-02-15 DIAGNOSIS — R262 Difficulty in walking, not elsewhere classified: Secondary | ICD-10-CM

## 2018-02-15 DIAGNOSIS — G8929 Other chronic pain: Secondary | ICD-10-CM

## 2018-02-15 DIAGNOSIS — R293 Abnormal posture: Secondary | ICD-10-CM

## 2018-02-15 DIAGNOSIS — M545 Low back pain: Secondary | ICD-10-CM

## 2018-02-15 DIAGNOSIS — M542 Cervicalgia: Secondary | ICD-10-CM

## 2018-02-15 NOTE — Therapy (Signed)
[place] MAIN [facility] SERVICES [address] [place], [place], [address] Phone: [phone]   Fax:  [phone]  Physical Therapy Treatment  Patient Details  Name: Shelly [name] MRN: [id number] Date of Birth: 11-24-1960 Referring Provider: Wyatt Portela (Dr. Louisa Second)   Encounter Date: 02/15/2018  PT End of Session - 02/15/18 0953    Visit Number  5    Number of Visits  13    Date for PT Re-Evaluation  03/09/18    Authorization Type  progress note 5/10 begin reporting period 01/26/2018    PT Start Time  0947    PT Stop Time  1030    PT Time Calculation (min)  43 min    Activity Tolerance  Patient tolerated treatment well;Patient limited by pain    Behavior During Therapy  [facility] for tasks assessed/performed       Past Medical History:  Diagnosis Date  . Asthma   . COPD (chronic obstructive pulmonary disease) ([place])   . Diabetes mellitus without complication ([place])   . Hypertension   . Sleep apnea     Past Surgical History:  Procedure Laterality Date  . ABDOMINAL HYSTERECTOMY    . COLON SURGERY      There were no vitals filed for this visit.  Subjective Assessment - 02/15/18 0951    Subjective  Patient reports less right side neck pain after last session, stating, "It felt a little better. But then the next day the left side of my neck was killing me so I took the tape off." Patient presents to therapy with increased swelling in feet; she reports, "I take fluid pills, but I haven't had them yet."     Pertinent History  Patient is a 57 yo female with significant PMH of asthma, COPD, diabetes, and hypertension and presents to PT today with complaints of neck pain. Pt had a fall 12/24/17 getting out of bathtub and has had severe neck pain ever since. Patient is having difficulty sleeping and gets no relief from tramadol or muscle relaxer medication. Pt's orthopedist is having a bone specialist coming to see her in [place] to evaluate bone spur  and neck pain further. Referred to PT to try to get some relief before seeing the bone specialist.    Limitations  Sitting;Reading;Lifting;Standing;Walking;House hold activities    How long can you sit comfortably?  Hours but pain increases    How long can you stand comfortably?  10 minutes    How long can you walk comfortably?  Walk around the house    Diagnostic tests  X-ray showed bone spur on vertebra in neck    Patient Stated Goals  To get rid of the pain     Currently in Pain?  Yes    Pain Score  10-Worst pain ever    Pain Location  Neck    Pain Orientation  Right;Posterior    Pain Descriptors / Indicators  Aching;Throbbing;Tender    Pain Type  Chronic pain    Pain Onset  More than a month ago    Pain Frequency  Constant    Aggravating Factors   prolonged sitting/lying down;     Pain Relieving Factors  heat/ medication    Effect of Pain on Daily Activities  decreased tolerance with ADLs;     Multiple Pain Sites  No          TREATMENT: Patient sitting in chair: PT performed US to right cervical paraspinals, continuous, 2.0 watts per  centimeter squared, x15 min in sitting; Patient tolerated well reporting slight reduction in neck pain with [place].   Following [place], PT performed soft tissue massage to right cervical paraspinals including cross friction, myofascial release, utilizing edge tool for IASTYM. Patient tolerate well with less compensation and less discomfort during treatment. She reports, "The massage does hurt some, but its not as bad. I can take it, anything to make this better."   Patient exhibits less trigger points in upper trap and levator scapulae. She also exhibits significant tightness in suboccipitals on right side. Patient reports slightly less pain following manual therapy; throughout session, patient exhibits slumped posture with significant forward head; Required cues to improve erect head and posture for better head control and less pain;   PT applied  kinesiotape to right and left cervical paraspinals (Y) pattern to improve support and reduce pain with erect posture; Educated patient on safe tape wear;                       PT Education - 02/15/18 0952    Education provided  Yes    Education Details  stretches, HEP reinforced, tape, ultrasound;     Person(s) Educated  Patient    Methods  Explanation;Demonstration;Verbal cues    Comprehension  Verbalized understanding;Returned demonstration;Verbal cues required;Need further instruction       PT Short Term Goals - 01/26/18 1251      PT SHORT TERM GOAL #1   Title  Patient will be independent in home exercise program to improve strength/mobility for better functional independence with ADLs.    Time  3    Period  Weeks    Status  New    Target Date  02/16/18        PT Long Term Goals - 01/26/18 1254      PT LONG TERM GOAL #1   Title  Patient will report worst pain of less than 5/10 to demonstrate improved tolerance for ADLs.     Baseline  8/21: 10/10    Time  6    Period  Weeks    Status  New    Target Date  03/09/18      PT LONG TERM GOAL #2   Title  Patient will report NDI of less than 45% disability to demonstrate improved tolerance for ADLs and improved mobility.    Baseline  8/21: 54%    Time  6    Period  Weeks    Status  New    Target Date  03/09/18      PT LONG TERM GOAL #3   Title  Patient will improve AROM flexion 40 deg, extension 40 deg, B lateral flexion 30 deg, and B rotation 60 deg pain free to demonstrate improved mobility for improved tolerance for ADLs.    Baseline  8/21: flexion 24 deg, ext 14 deg, R side bend 13 deg, L side bend 22 deg, R rot 34 deg, L rot 38 deg    Time  6    Period  Weeks    Status  New    Target Date  03/09/18            Plan - 02/15/18 1118    Clinical Impression Statement  Patient tolerated session well; She is still having increased levels of neck pain; Patient continues to have increased forward  head and prolonged cervical flexion which could be contributing to neck discomfort; She tolerates [place] well with less neck discomfort.  PT performed extensive soft tissue massage utilizing edge tool for IASTM. PT finished with tape to bilateral cervical paraspinals. She would benefit from additional skilled PT intervention to improve cervical ROM and reduce neck pain;     Rehab Potential  Fair    Clinical Impairments Affecting Rehab Potential  severe pain, minimal pain relief, varying levels of mobility, chronicity of neck pain    PT Frequency  2x / week    PT Duration  6 weeks    PT Treatment/Interventions  Biofeedback;Cryotherapy;Electrical Stimulation;Moist Heat;Traction;Ultrasound;Therapeutic activities;Therapeutic exercise;Patient/family education;Manual techniques;Passive range of motion;Dry needling;Energy conservation;Taping    PT Next Visit Plan  TENS unit, ultrasound, heat, pain relief techniques    PT Home Exercise Plan  give at next visit    Consulted and Agree with Plan of Care  Patient       Patient will benefit from skilled therapeutic intervention in order to improve the following deficits and impairments:  Decreased range of motion, Decreased mobility, Decreased strength, Hypomobility, Increased muscle spasms, Postural dysfunction, Pain  Visit Diagnosis: Cervicalgia  Abnormal posture  Difficulty in walking, not elsewhere classified  Chronic midline low back pain without sciatica     Problem List Patient Active Problem List   Diagnosis Date Noted  . DM (diabetes mellitus), type 2 ([place]) 09/08/2016  . COPD exacerbation ([place]) 09/07/2016  . Neck pain 04/02/2016  . Abdominal pain 01/04/2014    Savahanna Almendariz PT, DPT 02/15/2018, 1:36 PM  [place] MAIN [facility] SERVICES [address] [place], [place], [address] Phone: [phone]   Fax:  [phone]  Name: Shelly [name] MRN: [id number] Date of Birth: 15-Feb-1961

## 2018-02-17 ENCOUNTER — Ambulatory Visit: Payer: Medicare Other | Admitting: Physical Therapy

## 2018-02-17 ENCOUNTER — Encounter: Payer: Self-pay | Admitting: Physical Therapy

## 2018-02-17 DIAGNOSIS — M542 Cervicalgia: Secondary | ICD-10-CM | POA: Diagnosis not present

## 2018-02-17 DIAGNOSIS — R293 Abnormal posture: Secondary | ICD-10-CM

## 2018-02-17 DIAGNOSIS — R262 Difficulty in walking, not elsewhere classified: Secondary | ICD-10-CM

## 2018-02-17 NOTE — Therapy (Signed)
[place] MAIN [facility] SERVICES [address] [place], [place], [address] Phone: [phone]   Fax:  [phone]  Physical Therapy Treatment  Patient Details  Name: Shelly [name] MRN: [id number] Date of Birth: 12-23-1960 Referring Provider: Wyatt Portela (Dr. Louisa Second)   Encounter Date: 02/17/2018  PT End of Session - 02/17/18 1454    Visit Number  6    Number of Visits  13    Date for PT Re-Evaluation  03/09/18    Authorization Type  progress note 6/10 begin reporting period 01/26/2018    PT Start Time  1346    PT Stop Time  1428    PT Time Calculation (min)  42 min    Activity Tolerance  Patient tolerated treatment well;Patient limited by pain    Behavior During Therapy  [facility] for tasks assessed/performed       Past Medical History:  Diagnosis Date  . Asthma   . COPD (chronic obstructive pulmonary disease) ([place])   . Diabetes mellitus without complication ([place])   . Hypertension   . Sleep apnea     Past Surgical History:  Procedure Laterality Date  . ABDOMINAL HYSTERECTOMY    . COLON SURGERY      There were no vitals filed for this visit.  Subjective Assessment - 02/17/18 1453    Subjective  Patient reports continued neck discomfort which is worse when lying down and limiting her sleep. She reports that she will need to get a CT Scan of her wrist due to concern of fracture not healing;     Pertinent History  Patient is a 57 yo female with significant PMH of asthma, COPD, diabetes, and hypertension and presents to PT today with complaints of neck pain. Pt had a fall 12/24/17 getting out of bathtub and has had severe neck pain ever since. Patient is having difficulty sleeping and gets no relief from tramadol or muscle relaxer medication. Pt's orthopedist is having a bone specialist coming to see her in [place] to evaluate bone spur and neck pain further. Referred to PT to try to get some relief before seeing the bone specialist.    Limitations  Sitting;Reading;Lifting;Standing;Walking;House hold activities    How long can you sit comfortably?  Hours but pain increases    How long can you stand comfortably?  10 minutes    How long can you walk comfortably?  Walk around the house    Diagnostic tests  X-ray showed bone spur on vertebra in neck    Patient Stated Goals  To get rid of the pain     Currently in Pain?  Yes    Pain Score  10-Worst pain ever    Pain Location  Neck    Pain Orientation  Right;Posterior    Pain Descriptors / Indicators  Aching;Sore;Throbbing    Pain Type  Chronic pain    Pain Onset  More than a month ago    Pain Frequency  Constant    Aggravating Factors   prolonged sitting; prolonged lying down;     Pain Relieving Factors  heat/ medication;     Effect of Pain on Daily Activities  decreased tolerance with ADLs;     Multiple Pain Sites  No         [facility] PT Assessment - 02/17/18 0001      AROM   Cervical - Right Rotation  65    Cervical - Left Rotation  62  TREATMENT: Patient sitting in chair: PT performed US to right cervical paraspinals, continuous, 2.0 watts per centimeter squared, x15 min in sitting; Patient tolerated well reporting slight reduction in neck pain with [place].  Following [place] Patient transitioned to supine with moist heat to cervical paraspinals: PT performed sub occipital release 30 sec hold x5 reps; PT performed lateral translation of cervical spine PROM x5 reps x2 sets Performed soft tissue massage to right suboccipitals x6 min PT performed suboccipital release concurrent with chin tucks x5 reps- patient required min VCs for correct position and to avoid cervical flexion with chin tuck;   Following soft tissue work, re-assessed cervical ROM, see above; Patient reports less neck pain following manual therapy, stating, "It does feel better. Thankyou."                  PT Education - 02/17/18 1454    Education provided  Yes    Education  Details  stretches, HEP reinforced, ultrasound, manual therapy    Person(s) Educated  Patient    Methods  Explanation    Comprehension  Verbalized understanding       PT Short Term Goals - 01/26/18 1251      PT SHORT TERM GOAL #1   Title  Patient will be independent in home exercise program to improve strength/mobility for better functional independence with ADLs.    Time  3    Period  Weeks    Status  New    Target Date  02/16/18        PT Long Term Goals - 01/26/18 1254      PT LONG TERM GOAL #1   Title  Patient will report worst pain of less than 5/10 to demonstrate improved tolerance for ADLs.     Baseline  8/21: 10/10    Time  6    Period  Weeks    Status  New    Target Date  03/09/18      PT LONG TERM GOAL #2   Title  Patient will report NDI of less than 45% disability to demonstrate improved tolerance for ADLs and improved mobility.    Baseline  8/21: 54%    Time  6    Period  Weeks    Status  New    Target Date  03/09/18      PT LONG TERM GOAL #3   Title  Patient will improve AROM flexion 40 deg, extension 40 deg, B lateral flexion 30 deg, and B rotation 60 deg pain free to demonstrate improved mobility for improved tolerance for ADLs.    Baseline  8/21: flexion 24 deg, ext 14 deg, R side bend 13 deg, L side bend 22 deg, R rot 34 deg, L rot 38 deg    Time  6    Period  Weeks    Status  New    Target Date  03/09/18            Plan - 02/17/18 1455    Clinical Impression Statement  Patient tolerates ultrasound well; She is still having high levels of neck pain which she reports is exacerbated by "popping" with movement. However she reported that heat and soft tissue work on suboccipitals helped reduce her discomfort. In addition she exhibits improved ROM with significant chagne in cervical rotation bilaterally; Patient would benefit from additional skilled PT Intervention to improve cervical ROM and reduce neck pain;     Rehab Potential  Fair    Clinical  Impairments  Affecting Rehab Potential  severe pain, minimal pain relief, varying levels of mobility, chronicity of neck pain    PT Frequency  2x / week    PT Duration  6 weeks    PT Treatment/Interventions  Biofeedback;Cryotherapy;Electrical Stimulation;Moist Heat;Traction;Ultrasound;Therapeutic activities;Therapeutic exercise;Patient/family education;Manual techniques;Passive range of motion;Dry needling;Energy conservation;Taping    PT Next Visit Plan  TENS unit, ultrasound, heat, pain relief techniques    PT Home Exercise Plan  give at next visit    Consulted and Agree with Plan of Care  Patient       Patient will benefit from skilled therapeutic intervention in order to improve the following deficits and impairments:  Decreased range of motion, Decreased mobility, Decreased strength, Hypomobility, Increased muscle spasms, Postural dysfunction, Pain  Visit Diagnosis: Cervicalgia  Abnormal posture  Difficulty in walking, not elsewhere classified     Problem List Patient Active Problem List   Diagnosis Date Noted  . DM (diabetes mellitus), type 2 ([place]) 09/08/2016  . COPD exacerbation ([place]) 09/07/2016  . Neck pain 04/02/2016  . Abdominal pain 01/04/2014    Zenith Lamphier PT, DPT 02/17/2018, 3:07 PM  [place] MAIN [facility] SERVICES [address] [place], [place], [address] Phone: [phone]   Fax:  [phone]  Name: Shelly [name] MRN: [id number] Date of Birth: 07-03-1960

## 2018-02-21 ENCOUNTER — Other Ambulatory Visit: Payer: Self-pay | Admitting: Orthopaedic Surgery

## 2018-02-21 DIAGNOSIS — S52522A Torus fracture of lower end of left radius, initial encounter for closed fracture: Secondary | ICD-10-CM

## 2018-02-23 ENCOUNTER — Ambulatory Visit: Payer: Medicare Other | Admitting: Physical Therapy

## 2018-02-23 ENCOUNTER — Encounter: Payer: Self-pay | Admitting: Physical Therapy

## 2018-02-23 DIAGNOSIS — M542 Cervicalgia: Secondary | ICD-10-CM

## 2018-02-23 DIAGNOSIS — R262 Difficulty in walking, not elsewhere classified: Secondary | ICD-10-CM

## 2018-02-23 DIAGNOSIS — R293 Abnormal posture: Secondary | ICD-10-CM

## 2018-02-23 NOTE — Therapy (Signed)
[place] MAIN [facility] SERVICES [address] [place], [place], [address] Phone: [phone]   Fax:  [phone]  Physical Therapy Treatment  Patient Details  Name: Shelly [name] MRN: [id number] Date of Birth: Nov 27, 1960 Referring Provider: Wyatt Portela (Dr. Louisa Second)   Encounter Date: 02/23/2018  PT End of Session - 02/23/18 1439    Visit Number  7    Number of Visits  13    Date for PT Re-Evaluation  03/09/18    Authorization Type  progress note 7/10 begin reporting period 01/26/2018    PT Start Time  1310    PT Stop Time  1355    PT Time Calculation (min)  45 min    Activity Tolerance  Patient tolerated treatment well;Patient limited by pain    Behavior During Therapy  [facility] for tasks assessed/performed       Past Medical History:  Diagnosis Date  . Asthma   . COPD (chronic obstructive pulmonary disease) ([place])   . Diabetes mellitus without complication ([place])   . Hypertension   . Sleep apnea     Past Surgical History:  Procedure Laterality Date  . ABDOMINAL HYSTERECTOMY    . COLON SURGERY      There were no vitals filed for this visit.  Subjective Assessment - 02/23/18 1437    Subjective  Patient reports increased neck pain at start of session. She reports, "What can I do to help my neck. Its just killing me."     Pertinent History  Patient is a 57 yo female with significant PMH of asthma, COPD, diabetes, and hypertension and presents to PT today with complaints of neck pain. Pt had a fall 12/24/17 getting out of bathtub and has had severe neck pain ever since. Patient is having difficulty sleeping and gets no relief from tramadol or muscle relaxer medication. Pt's orthopedist is having a bone specialist coming to see her in [place] to evaluate bone spur and neck pain further. Referred to PT to try to get some relief before seeing the bone specialist.    Limitations  Sitting;Reading;Lifting;Standing;Walking;House hold activities    How long can you sit comfortably?  Hours but pain increases    How long can you stand comfortably?  10 minutes    How long can you walk comfortably?  Walk around the house    Diagnostic tests  X-ray showed bone spur on vertebra in neck    Patient Stated Goals  To get rid of the pain     Currently in Pain?  Yes    Pain Score  10-Worst pain ever    Pain Location  Neck    Pain Orientation  Posterior    Pain Descriptors / Indicators  Aching;Sore    Pain Type  Chronic pain    Pain Onset  More than a month ago    Pain Frequency  Constant    Aggravating Factors   prolonged sitting; prolonged lying down    Pain Relieving Factors  heat/medication    Effect of Pain on Daily Activities  decreased tolerance with ADLs;     Multiple Pain Sites  No           TREATMENT: Patient sitting in chair: PT performed US to right cervical paraspinals, continuous, 1.8 watts per centimeter squared, x46min in sitting; Patient tolerated well reporting slight reduction in neck pain with [place].  Following [place] Patient transitioned to prone with moist heat to cervical paraspinals (x3 min): PT performed sub  occipital release 30 sec hold x5 reps; PT performed soft/deep tissue massage to bilateral cervical paraspinals including upper trap and levator scapulae; Patient exhibits increased tenderness and tightness in suboccipitals bilaterally; Patient initially had increased tenderness to suboccipitals but then with increased manual therapy reported slightly less tenderness;   Patient supine: PT performed suboccipital release 30 sec hold x3 reps with cues to relax for better tissue extensbility; Passive upper trap stretch 20 sec hold x2 reps bilaterally;  Patient reports less pain following session to 6/10. She states, "It does feel better, but its still really sore."                     PT Education - 02/23/18 1438    Education provided  Yes    Education Details  [place], manual therapy, postural  re-education    Person(s) Educated  Patient    Methods  Explanation    Comprehension  Verbalized understanding       PT Short Term Goals - 01/26/18 1251      PT SHORT TERM GOAL #1   Title  Patient will be independent in home exercise program to improve strength/mobility for better functional independence with ADLs.    Time  3    Period  Weeks    Status  New    Target Date  02/16/18        PT Long Term Goals - 01/26/18 1254      PT LONG TERM GOAL #1   Title  Patient will report worst pain of less than 5/10 to demonstrate improved tolerance for ADLs.     Baseline  8/21: 10/10    Time  6    Period  Weeks    Status  New    Target Date  03/09/18      PT LONG TERM GOAL #2   Title  Patient will report NDI of less than 45% disability to demonstrate improved tolerance for ADLs and improved mobility.    Baseline  8/21: 54%    Time  6    Period  Weeks    Status  New    Target Date  03/09/18      PT LONG TERM GOAL #3   Title  Patient will improve AROM flexion 40 deg, extension 40 deg, B lateral flexion 30 deg, and B rotation 60 deg pain free to demonstrate improved mobility for improved tolerance for ADLs.    Baseline  8/21: flexion 24 deg, ext 14 deg, R side bend 13 deg, L side bend 22 deg, R rot 34 deg, L rot 38 deg    Time  6    Period  Weeks    Status  New    Target Date  03/09/18            Plan - 02/23/18 1442    Clinical Impression Statement  Patient is responding well to [place] and manual therapy; While she has increased tenderness she does report less pain at end of session. Patient did exhibit increased tightness in bilateral suboccipital paraspinals. Patient reports less pain to 6/10 at end of session. Reinforced importance of HEP adherence and good posture when sitting. Patient would benefit from additional skilled PT intervention to improve ROM and reduce pain with ADLs;     Rehab Potential  Fair    Clinical Impairments Affecting Rehab Potential  severe pain,  minimal pain relief, varying levels of mobility, chronicity of neck pain    PT Frequency  2x /  week    PT Duration  6 weeks    PT Treatment/Interventions  Biofeedback;Cryotherapy;Electrical Stimulation;Moist Heat;Traction;Ultrasound;Therapeutic activities;Therapeutic exercise;Patient/family education;Manual techniques;Passive range of motion;Dry needling;Energy conservation;Taping    PT Next Visit Plan  TENS unit, ultrasound, heat, pain relief techniques    PT Home Exercise Plan  give at next visit    Consulted and Agree with Plan of Care  Patient       Patient will benefit from skilled therapeutic intervention in order to improve the following deficits and impairments:  Decreased range of motion, Decreased mobility, Decreased strength, Hypomobility, Increased muscle spasms, Postural dysfunction, Pain  Visit Diagnosis: Cervicalgia  Abnormal posture  Difficulty in walking, not elsewhere classified     Problem List Patient Active Problem List   Diagnosis Date Noted  . DM (diabetes mellitus), type 2 ([place]) 09/08/2016  . COPD exacerbation ([place]) 09/07/2016  . Neck pain 04/02/2016  . Abdominal pain 01/04/2014    Tena Linebaugh PT, DPT 02/23/2018, 2:46 PM  [place] MAIN [facility] SERVICES [address] [place], [place], [address] Phone: [phone]   Fax:  [phone]  Name: ENIS LEATHERWOOD MRN: [id number] Date of Birth: [birth year]

## 2018-02-28 ENCOUNTER — Ambulatory Visit: Payer: Medicare Other

## 2018-02-28 VITALS — BP 122/83 | HR 86

## 2018-02-28 DIAGNOSIS — M542 Cervicalgia: Secondary | ICD-10-CM | POA: Diagnosis not present

## 2018-02-28 DIAGNOSIS — R262 Difficulty in walking, not elsewhere classified: Secondary | ICD-10-CM

## 2018-02-28 NOTE — Therapy (Signed)
[place] MAIN [facility] SERVICES [address] [place], [place], [address] Phone: [phone]   Fax:  [phone]  Physical Therapy Treatment  Patient Details  Name: Shelly [name] MRN: [id number] Date of Birth: Oct 10, 1960 Referring Provider: Wyatt Portela (Dr. Louisa Second)   Encounter Date: 02/28/2018  PT End of Session - 02/28/18 1300    Visit Number  8    Number of Visits  13    Date for PT Re-Evaluation  03/09/18    Authorization Type  progress note 8/10 begin reporting period 01/26/2018, goals updated 02/28/18    PT Start Time  1301    PT Stop Time  1344    PT Time Calculation (min)  43 min    Activity Tolerance  Patient tolerated treatment well    Behavior During Therapy  [facility] for tasks assessed/performed       Past Medical History:  Diagnosis Date  . Asthma   . COPD (chronic obstructive pulmonary disease) ([place])   . Diabetes mellitus without complication ([place])   . Hypertension   . Sleep apnea     Past Surgical History:  Procedure Laterality Date  . ABDOMINAL HYSTERECTOMY    . COLON SURGERY      Vitals:   02/28/18 1308  BP: 122/83  Pulse: 86  SpO2: (!) 87%    Subjective Assessment - 02/28/18 1300    Subjective  Pt states that her neck pain is a 9/10 today but it was worse yesterday. She has no specific questions upon arrival.     Pertinent History  Patient is a 57 yo female with significant PMH of asthma, COPD, diabetes, and hypertension and presents to PT today with complaints of neck pain. Pt had a fall 12/24/17 getting out of bathtub and has had severe neck pain ever since. Patient is having difficulty sleeping and gets no relief from tramadol or muscle relaxer medication. Pt's orthopedist is having a bone specialist coming to see her in [place] to evaluate bone spur and neck pain further. Referred to PT to try to get some relief before seeing the bone specialist.    Limitations  Sitting;Reading;Lifting;Standing;Walking;House  hold activities    How long can you sit comfortably?  Hours but pain increases    How long can you stand comfortably?  10 minutes    How long can you walk comfortably?  Walk around the house    Diagnostic tests  X-ray showed bone spur on vertebra in neck    Patient Stated Goals  To get rid of the pain     Currently in Pain?  Yes    Pain Score  9     Pain Location  Neck    Pain Orientation  Posterior    Pain Descriptors / Indicators  Aching    Pain Type  Chronic pain    Pain Onset  More than a month ago    Pain Frequency  Constant          TREATMENT:  Ultrasound Patient sitting in chair: PT performed US to right cervical paraspinals, continuous, 3.3 Mhz, 1.8 watts per centimeter squared, x 74min in sitting; Patient tolerated well reporting slight reduction in neck pain with [place].  Manual Therapy  Pt completed NDI (unbilled); Scored and reviewed NDI (68%) with patient; Cervical ROM measurements obtained and are as follows: flexion: 50 extension: 31 R lateral flexion: 34 L lateral flexion: 40 R rotation: 48 L rotation: 54  Following [place] Patient transitioned to supine: PT  performed sub occipital release 30 sec hold x 5 reps; PT performed soft/deep tissue massage to bilateral cervical paraspinals including upper trap and levator scapulae; Patient exhibits increased tenderness and tightness in suboccipitals bilaterally; Grade I mobilizations C2-C5 30s/bout x 2 bouts at each level; Passive upper trap and levator stretch 30s hold x 2 reps each bilaterally; Cervical retraction into pillow 5s hold x 5;  Pt educated throughout session about proper posture and technique with exercises. Improved exercise technique, movement at target joints, use of target muscles after min to mod verbal, visual, tactile cues.   Goals updated with patient today during session. NDI is 68% which is worse than initial evaluation however cervical AROM is significantly improved in all planes. Pt reports slight  improvement in pain at end of session. Pt will continue to benefit from PT services to address deficits in strength, balance, and mobility in order to return to full function at home.                     PT Short Term Goals - 01/26/18 1251      PT SHORT TERM GOAL #1   Title  Patient will be independent in home exercise program to improve strength/mobility for better functional independence with ADLs.    Time  3    Period  Weeks    Status  New    Target Date  02/16/18        PT Long Term Goals - 02/28/18 1329      PT LONG TERM GOAL #1   Title  Patient will report worst pain of less than 5/10 to demonstrate improved tolerance for ADLs.     Baseline  8/21: 10/10; 02/28/18: 10/10    Time  6    Period  Weeks    Status  On-going      PT LONG TERM GOAL #2   Title  Patient will report NDI of less than 45% disability to demonstrate improved tolerance for ADLs and improved mobility.    Baseline  8/21: 54%; 02/28/18: 68%    Time  6    Period  Weeks    Status  On-going      PT LONG TERM GOAL #3   Title  Patient will improve AROM flexion 40 deg, extension 40 deg, B lateral flexion 30 deg, and B rotation 60 deg pain free to demonstrate improved mobility for improved tolerance for ADLs.    Baseline  8/21: flexion 24 deg, ext 14 deg, R side bend 13 deg, L side bend 22 deg, R rot 34 deg, L rot 38 deg; 9/23: flexion 50 deg, ext 31 deg, R side bend 34 deg, L side bend 40 deg, R rot 34 deg, L rot 48 deg    Time  6    Period  Weeks    Status  New            Plan - 02/28/18 1300    Clinical Impression Statement  Goals updated with patient today during session. NDI is 68% which is worse than initial evaluation however cervical AROM is significantly improved in all planes. Pt reports slight improvement in pain at end of session. Pt will continue to benefit from PT services to address deficits in strength, balance, and mobility in order to return to full function at home.      Rehab Potential  Fair    Clinical Impairments Affecting Rehab Potential  severe pain, minimal pain relief, varying levels of mobility,  chronicity of neck pain    PT Frequency  2x / week    PT Duration  6 weeks    PT Treatment/Interventions  Biofeedback;Cryotherapy;Electrical Stimulation;Moist Heat;Traction;Ultrasound;Therapeutic activities;Therapeutic exercise;Patient/family education;Manual techniques;Passive range of motion;Dry needling;Energy conservation;Taping    PT Next Visit Plan  TENS unit, ultrasound, heat, pain relief techniques    PT Home Exercise Plan  give at next visit    Consulted and Agree with Plan of Care  Patient       Patient will benefit from skilled therapeutic intervention in order to improve the following deficits and impairments:  Decreased range of motion, Decreased mobility, Decreased strength, Hypomobility, Increased muscle spasms, Postural dysfunction, Pain  Visit Diagnosis: Difficulty in walking, not elsewhere classified     Problem List Patient Active Problem List   Diagnosis Date Noted  . DM (diabetes mellitus), type 2 ([place]) 09/08/2016  . COPD exacerbation ([place]) 09/07/2016  . Neck pain 04/02/2016  . Abdominal pain 01/04/2014   Phillips Grout PT, DPT, GCS  Barrington Worley 02/28/2018, 3:13 PM  [place] MAIN [facility] SERVICES [address] [place], [place], [address] Phone: [phone]   Fax:  [phone]  Name: Shelly [name] MRN: [id number] Date of Birth: 02-09-1961

## 2018-03-01 ENCOUNTER — Ambulatory Visit
Admission: RE | Admit: 2018-03-01 | Discharge: 2018-03-01 | Disposition: A | Discharge status: Home / Self Care | Payer: Medicare Other | Source: Ambulatory Visit | Attending: Orthopaedic Surgery | Admitting: Orthopaedic Surgery

## 2018-03-01 DIAGNOSIS — S52522D Torus fracture of lower end of left radius, subsequent encounter for fracture with routine healing: Secondary | ICD-10-CM | POA: Insufficient documentation

## 2018-03-01 DIAGNOSIS — S52522A Torus fracture of lower end of left radius, initial encounter for closed fracture: Secondary | ICD-10-CM

## 2018-03-02 ENCOUNTER — Ambulatory Visit: Payer: Medicare Other | Admitting: Physical Therapy

## 2018-03-02 ENCOUNTER — Encounter: Payer: Medicare Other | Admitting: Physical Therapy

## 2018-03-02 DIAGNOSIS — S52509A Unspecified fracture of the lower end of unspecified radius, initial encounter for closed fracture: Secondary | ICD-10-CM | POA: Insufficient documentation

## 2018-03-03 ENCOUNTER — Encounter: Payer: Self-pay | Admitting: Physical Therapy

## 2018-03-03 ENCOUNTER — Ambulatory Visit: Payer: Medicare Other | Admitting: Physical Therapy

## 2018-03-03 DIAGNOSIS — R293 Abnormal posture: Secondary | ICD-10-CM

## 2018-03-03 DIAGNOSIS — M542 Cervicalgia: Secondary | ICD-10-CM

## 2018-03-03 DIAGNOSIS — R262 Difficulty in walking, not elsewhere classified: Secondary | ICD-10-CM

## 2018-03-03 NOTE — Therapy (Signed)
[place] MAIN [facility] SERVICES [address] [place], [place], [address] Phone: [phone]   Fax:  [phone]  Physical Therapy Treatment  Patient Details  Name: Shelly [name] MRN: [id number] Date of Birth: Feb 16, 1961 Referring Provider (PT): Shelly [name] (Dr. Louisa Second)   Encounter Date: 03/03/2018  PT End of Session - 03/03/18 1604    Visit Number  9    Number of Visits  13    Date for PT Re-Evaluation  03/09/18    Authorization Type  progress note 9/10 begin reporting period 01/26/2018, goals updated 02/28/18    PT Start Time  1600    PT Stop Time  1645    PT Time Calculation (min)  45 min    Activity Tolerance  Patient tolerated treatment well    Behavior During Therapy  [facility] for tasks assessed/performed       Past Medical History:  Diagnosis Date  . Asthma   . COPD (chronic obstructive pulmonary disease) ([place])   . Diabetes mellitus without complication ([place])   . Hypertension   . Sleep apnea     Past Surgical History:  Procedure Laterality Date  . ABDOMINAL HYSTERECTOMY    . COLON SURGERY      There were no vitals filed for this visit.  Subjective Assessment - 03/03/18 1605    Subjective  Patient reports her doctor has given her a new anti-inflammatory medication as well as a muscle relaxer to try to help with her neck pain. Pt reports her neck is feeling some better today.     Pertinent History  Patient is a 57 yo female with significant PMH of asthma, COPD, diabetes, and hypertension and presents to PT today with complaints of neck pain. Pt had a fall 12/24/17 getting out of bathtub and has had severe neck pain ever since. Patient is having difficulty sleeping and gets no relief from tramadol or muscle relaxer medication. Pt's orthopedist is having a bone specialist coming to see her in [place] to evaluate bone spur and neck pain further. Referred to PT to try to get some relief before seeing the bone specialist.    Limitations   Sitting;Reading;Lifting;Standing;Walking;House hold activities    How long can you sit comfortably?  Hours but pain increases    How long can you stand comfortably?  10 minutes    How long can you walk comfortably?  Walk around the house    Diagnostic tests  X-ray showed bone spur on vertebra in neck    Patient Stated Goals  To get rid of the pain     Currently in Pain?  Yes    Pain Score  8     Pain Location  Neck    Pain Orientation  Posterior    Pain Descriptors / Indicators  Aching    Pain Type  Chronic pain    Pain Onset  More than a month ago    Pain Frequency  Constant    Aggravating Factors   prolonged sitting; prolonged lying down    Pain Relieving Factors  heat/medication    Effect of Pain on Daily Activities  decreased tolerance with ADLs;    Multiple Pain Sites  No       TREATMENT: Patient sitting in chair: PT performed US to bilateral cervical paraspinals, continuous, 2.0 watts per centimeter squared, x55min in sitting; Patient tolerated well reporting slight reduction in neck pain with [place].  Following [place] Patient transitioned to supine with moist heat to  bilateral cervical paraspinals (x3 min, unbilled) -PT performed sub occipital release 30 sec hold x5 reps; -PT performed soft/deep tissue massage to bilateral cervical paraspinals including upper trap and levator scapulae x16 min; Patient exhibits increased tenderness and tightness in suboccipitals bilaterally; Patient initially had increased tenderness to suboccipitals but then with increased manual therapy reported slightly less tenderness;  -Passive upper trap stretch 20 sec hold x2 reps bilaterally; attempted grade I-II central PAs at C3-C4 with patient reporting increased discomfort (discontinued)  Patient reports less pain following session stating, "It does feel better, but its still really sore."                         PT Education - 03/03/18 1603    Education provided  Yes     Education Details  [place], manual therapy, postural re-education    Person(s) Educated  Patient    Methods  Explanation    Comprehension  Verbalized understanding       PT Short Term Goals - 01/26/18 1251      PT SHORT TERM GOAL #1   Title  Patient will be independent in home exercise program to improve strength/mobility for better functional independence with ADLs.    Time  3    Period  Weeks    Status  New    Target Date  02/16/18        PT Long Term Goals - 02/28/18 1329      PT LONG TERM GOAL #1   Title  Patient will report worst pain of less than 5/10 to demonstrate improved tolerance for ADLs.     Baseline  8/21: 10/10; 02/28/18: 10/10    Time  6    Period  Weeks    Status  On-going      PT LONG TERM GOAL #2   Title  Patient will report NDI of less than 45% disability to demonstrate improved tolerance for ADLs and improved mobility.    Baseline  8/21: 54%; 02/28/18: 68%    Time  6    Period  Weeks    Status  On-going      PT LONG TERM GOAL #3   Title  Patient will improve AROM flexion 40 deg, extension 40 deg, B lateral flexion 30 deg, and B rotation 60 deg pain free to demonstrate improved mobility for improved tolerance for ADLs.    Baseline  8/21: flexion 24 deg, ext 14 deg, R side bend 13 deg, L side bend 22 deg, R rot 34 deg, L rot 38 deg; 9/23: flexion 50 deg, ext 31 deg, R side bend 34 deg, L side bend 40 deg, R rot 34 deg, L rot 48 deg    Time  6    Period  Weeks    Status  New            Plan - 03/03/18 1608    Clinical Impression Statement  Patient is responding well to [place] and manual therapy; does report less pain at end of session. Patient did exhibit increased tightness in bilateral suboccipital paraspinals. Patient reports less pain at end of session. Unable to rate pain at end of session but does report "its some better". Reinforced importance of HEP adherence and good posture when sitting. Patient would benefit from additional skilled PT intervention  to improve ROM and reduce pain with ADLs;      Rehab Potential  Fair    Clinical Impairments Affecting Rehab Potential  severe pain, minimal pain relief, varying levels of mobility, chronicity of neck pain    PT Frequency  2x / week    PT Duration  6 weeks    PT Treatment/Interventions  Biofeedback;Cryotherapy;Electrical Stimulation;Moist Heat;Traction;Ultrasound;Therapeutic activities;Therapeutic exercise;Patient/family education;Manual techniques;Passive range of motion;Dry needling;Energy conservation;Taping    PT Next Visit Plan  TENS unit, ultrasound, heat, pain relief techniques    PT Home Exercise Plan  give at next visit    Consulted and Agree with Plan of Care  Patient       Patient will benefit from skilled therapeutic intervention in order to improve the following deficits and impairments:  Decreased range of motion, Decreased mobility, Decreased strength, Hypomobility, Increased muscle spasms, Postural dysfunction, Pain  Visit Diagnosis: Cervicalgia  Abnormal posture  Difficulty in walking, not elsewhere classified     Problem List Patient Active Problem List   Diagnosis Date Noted  . DM (diabetes mellitus), type 2 ([place]) 09/08/2016  . COPD exacerbation ([place]) 09/07/2016  . Neck pain 04/02/2016  . Abdominal pain 01/04/2014    Trotter,Margaret PT, DPT 03/03/2018, 4:26 PM  [place] MAIN [facility] SERVICES [address] [place], [place], [address] Phone: [phone]   Fax:  [phone]  Name: DALEIZA BACCHI MRN: [id number] Date of Birth: 10-09-1960

## 2018-03-07 ENCOUNTER — Ambulatory Visit: Payer: Medicare Other | Admitting: Physical Therapy

## 2018-03-07 ENCOUNTER — Encounter: Payer: Self-pay | Admitting: Physical Therapy

## 2018-03-07 DIAGNOSIS — R293 Abnormal posture: Secondary | ICD-10-CM

## 2018-03-07 DIAGNOSIS — R262 Difficulty in walking, not elsewhere classified: Secondary | ICD-10-CM

## 2018-03-07 DIAGNOSIS — M542 Cervicalgia: Secondary | ICD-10-CM | POA: Diagnosis not present

## 2018-03-07 NOTE — Therapy (Signed)
[place] MAIN [facility] SERVICES [address] [place], [place], [address] Phone: [phone]   Fax:  [phone]  Physical Therapy Treatment Physical Therapy Progress Note   Dates of reporting period  01/26/18   to   03/07/18   Patient Details  Name: Shelly [name] MRN: [id number] Date of Birth: August 01, 1960 Referring Provider (PT): Wyatt Portela (Dr. Louisa Second)   Encounter Date: 03/07/2018  PT End of Session - 03/07/18 1148    Visit Number  10    Number of Visits  25    Date for PT Re-Evaluation  04/18/18    Authorization Type  progress note 10/10 begin reporting period 01/26/2018, goals updated 02/28/18    PT Start Time  1145    PT Stop Time  1230    PT Time Calculation (min)  45 min    Activity Tolerance  Patient tolerated treatment well    Behavior During Therapy  [facility] for tasks assessed/performed       Past Medical History:  Diagnosis Date  . Asthma   . COPD (chronic obstructive pulmonary disease) ([place])   . Diabetes mellitus without complication ([place])   . Hypertension   . Sleep apnea     Past Surgical History:  Procedure Laterality Date  . ABDOMINAL HYSTERECTOMY    . COLON SURGERY      There were no vitals filed for this visit.  Subjective Assessment - 03/07/18 1316    Subjective  Patient reports less pain after last session but states that night the pain came back. She reports her pain is worse in the evenings with increased sleep disturances/headaches;     Pertinent History  Patient is a 57 yo female with significant PMH of asthma, COPD, diabetes, and hypertension and presents to PT today with complaints of neck pain. Pt had a fall 12/24/17 getting out of bathtub and has had severe neck pain ever since. Patient is having difficulty sleeping and gets no relief from tramadol or muscle relaxer medication. Pt's orthopedist is having a bone specialist coming to see her in [place] to evaluate bone spur and neck pain further. Referred to  PT to try to get some relief before seeing the bone specialist.    Limitations  Sitting;Reading;Lifting;Standing;Walking;House hold activities    How long can you sit comfortably?  Hours but pain increases    How long can you stand comfortably?  10 minutes    How long can you walk comfortably?  Walk around the house    Diagnostic tests  X-ray showed bone spur on vertebra in neck    Patient Stated Goals  To get rid of the pain     Currently in Pain?  Yes    Pain Score  7     Pain Location  Neck    Pain Orientation  Right;Posterior    Pain Descriptors / Indicators  Aching;Sore    Pain Type  Chronic pain    Pain Onset  More than a month ago    Pain Frequency  Intermittent    Aggravating Factors   prolonged sitting/lying down    Pain Relieving Factors  heat/medicaion    Effect of Pain on Daily Activities  decreased toleranc with ADLs;     Multiple Pain Sites  No         [facility] PT Assessment - 03/07/18 0001      Observation/Other Assessments   Neck Disability Index   66% (the higher the score the greater disability, more  impaired than 01/26/18 which was 54%)      AROM   Cervical Flexion  40    Cervical Extension  45    Cervical - Right Side Bend  40    Cervical - Left Side Bend  35    Cervical - Right Rotation  60    Cervical - Left Rotation  50         TREATMENT: Patient sitting in chair: PT performed US to bilateral cervical paraspinals, continuous,2.0watts per centimeter squared, x44min in sitting; Patient tolerated well reporting slight reduction in neck pain with [place].  Following [place] Patient transitioned tosupine with moist heat to bilateral cervical paraspinals (x3 min, unbilled) -PT performed sub occipital release 30 sec hold x5 reps; -PT performedsoft/deep tissue massage to bilateral cervical paraspinals including upper trap and levator scapulae x16 min; Patient exhibits increased tenderness and tightness in suboccipitals bilaterally; Patient initially had increased  tenderness to suboccipitals but then with increased manual therapy reported slightly less tenderness;  -Passive upper trap stretch 20 sec hold x2 reps bilaterally; attempted grade I-II central PAs at C3-C4 with patient reporting increased discomfort (discontinued)  Patient reports less pain following session stating, "It does feel better, but its still really sore."                    PT Education - 03/07/18 1148    Education provided  Yes    Education Details  progress towards goals, US/manual therapy; postural re-education    Person(s) Educated  Patient    Methods  Explanation    Comprehension  Verbalized understanding;Need further instruction       PT Short Term Goals - 03/07/18 1322      PT SHORT TERM GOAL #1   Title  Patient will be independent in home exercise program to improve strength/mobility for better functional independence with ADLs.    Time  3    Period  Weeks    Status  On-going    Target Date  04/18/18        PT Long Term Goals - 02/28/18 1329      PT LONG TERM GOAL #1   Title  Patient will report worst pain of less than 5/10 to demonstrate improved tolerance for ADLs.     Baseline  8/21: 10/10; 02/28/18: 10/10    Time  6    Period  Weeks    Status  On-going      PT LONG TERM GOAL #2   Title  Patient will report NDI of less than 45% disability to demonstrate improved tolerance for ADLs and improved mobility.    Baseline  8/21: 54%; 02/28/18: 68%    Time  6    Period  Weeks    Status  On-going      PT LONG TERM GOAL #3   Title  Patient will improve AROM flexion 40 deg, extension 40 deg, B lateral flexion 30 deg, and B rotation 60 deg pain free to demonstrate improved mobility for improved tolerance for ADLs.    Baseline  8/21: flexion 24 deg, ext 14 deg, R side bend 13 deg, L side bend 22 deg, R rot 34 deg, L rot 38 deg; 9/23: flexion 50 deg, ext 31 deg, R side bend 34 deg, L side bend 40 deg, R rot 34 deg, L rot 48 deg    Time  6     Period  Weeks    Status  New  Plan - 03/07/18 1317    Clinical Impression Statement  Patient continues to score high on NDI however was slightly improved than last week. She reports continued neck pain which is worse in the evenings. Patient educated on importance of good pillow support to improve cervical lordosis to reduce neck dscomfort. Patient reports less pain at end of session. She exhibits significant improvement in cervical ROM compared to initial eval. She continues to have tightness in cervical paraspinals, especially suboccipitals. She would benefit from additional skilled PT Intervention to improve postural control and reduce neck pain;  Patient's condition has the potential to improve in response to therapy. Maximum improvement is yet to be obtained. The anticipated improvement is attainable and reasonable in a generally predictable time.  Patient reports better flexibility but increased neck pain, which is worse at night with increased sleep disturbances;     Rehab Potential  Fair    Clinical Impairments Affecting Rehab Potential  severe pain, minimal pain relief, varying levels of mobility, chronicity of neck pain    PT Frequency  2x / week    PT Duration  6 weeks    PT Treatment/Interventions  Biofeedback;Cryotherapy;Electrical Stimulation;Moist Heat;Traction;Ultrasound;Therapeutic activities;Therapeutic exercise;Patient/family education;Manual techniques;Passive range of motion;Dry needling;Energy conservation;Taping    PT Next Visit Plan  TENS unit, ultrasound, heat, pain relief techniques    PT Home Exercise Plan  give at next visit    Consulted and Agree with Plan of Care  Patient       Patient will benefit from skilled therapeutic intervention in order to improve the following deficits and impairments:  Decreased range of motion, Decreased mobility, Decreased strength, Hypomobility, Increased muscle spasms, Postural dysfunction, Pain  Visit  Diagnosis: Cervicalgia  Abnormal posture  Difficulty in walking, not elsewhere classified     Problem List Patient Active Problem List   Diagnosis Date Noted  . DM (diabetes mellitus), type 2 ([place]) 09/08/2016  . COPD exacerbation ([place]) 09/07/2016  . Neck pain 04/02/2016  . Abdominal pain 01/04/2014    Trotter,Margaret PT, DPT 03/07/2018, 1:24 PM  [place] MAIN [facility] SERVICES [address] [place], [place], [address] Phone: [phone]   Fax:  [phone]  Name: Shelly [name] MRN: [id number] Date of Birth: [birth year]

## 2018-03-09 ENCOUNTER — Ambulatory Visit: Payer: Medicare Other | Attending: Physician Assistant | Admitting: Physical Therapy

## 2018-03-09 DIAGNOSIS — R262 Difficulty in walking, not elsewhere classified: Secondary | ICD-10-CM | POA: Insufficient documentation

## 2018-03-09 DIAGNOSIS — M545 Low back pain: Secondary | ICD-10-CM | POA: Insufficient documentation

## 2018-03-09 DIAGNOSIS — R293 Abnormal posture: Secondary | ICD-10-CM | POA: Insufficient documentation

## 2018-03-09 DIAGNOSIS — G8929 Other chronic pain: Secondary | ICD-10-CM | POA: Insufficient documentation

## 2018-03-09 DIAGNOSIS — M542 Cervicalgia: Secondary | ICD-10-CM | POA: Insufficient documentation

## 2018-03-14 ENCOUNTER — Ambulatory Visit: Payer: Medicare Other | Admitting: Physical Therapy

## 2018-03-14 ENCOUNTER — Encounter: Payer: Self-pay | Admitting: Physical Therapy

## 2018-03-14 DIAGNOSIS — R293 Abnormal posture: Secondary | ICD-10-CM

## 2018-03-14 DIAGNOSIS — M545 Low back pain: Secondary | ICD-10-CM

## 2018-03-14 DIAGNOSIS — M542 Cervicalgia: Secondary | ICD-10-CM | POA: Diagnosis not present

## 2018-03-14 DIAGNOSIS — R262 Difficulty in walking, not elsewhere classified: Secondary | ICD-10-CM | POA: Diagnosis present

## 2018-03-14 DIAGNOSIS — G8929 Other chronic pain: Secondary | ICD-10-CM | POA: Diagnosis present

## 2018-03-14 NOTE — Therapy (Signed)
[place] MAIN [facility] SERVICES [address] [place], [place], [address] Phone: [phone]   Fax:  [phone]  Physical Therapy Treatment  Patient Details  Name: Shelly [name] MRN: [id number] Date of Birth: 1960-09-11 Referring Provider (PT): Wyatt Portela (Dr. Louisa Second)   Encounter Date: 03/14/2018  PT End of Session - 03/14/18 1139    Visit Number  11    Number of Visits  25    Date for PT Re-Evaluation  04/18/18    Authorization Type  progress note 1/10  goals updated 03/07/18    PT Start Time  1019    PT Stop Time  1100    PT Time Calculation (min)  41 min    Activity Tolerance  Patient limited by pain    Behavior During Therapy  [facility] for tasks assessed/performed       Past Medical History:  Diagnosis Date  . Asthma   . COPD (chronic obstructive pulmonary disease) ([place])   . Diabetes mellitus without complication ([place])   . Hypertension   . Sleep apnea     Past Surgical History:  Procedure Laterality Date  . ABDOMINAL HYSTERECTOMY    . COLON SURGERY      There were no vitals filed for this visit.  Subjective Assessment - 03/14/18 1134    Subjective  Patient reports continued neck pain especially in the evenings with increased sleep disturbances.     Pertinent History  Patient is a 57 yo female with significant PMH of asthma, COPD, diabetes, and hypertension and presents to PT today with complaints of neck pain. Pt had a fall 12/24/17 getting out of bathtub and has had severe neck pain ever since. Patient is having difficulty sleeping and gets no relief from tramadol or muscle relaxer medication. Pt's orthopedist is having a bone specialist coming to see her in [place] to evaluate bone spur and neck pain further. Referred to PT to try to get some relief before seeing the bone specialist.    Limitations  Sitting;Reading;Lifting;Standing;Walking;House hold activities    How long can you sit comfortably?  Hours but pain increases     How long can you stand comfortably?  10 minutes    How long can you walk comfortably?  Walk around the house    Diagnostic tests  X-ray showed bone spur on vertebra in neck    Patient Stated Goals  To get rid of the pain     Currently in Pain?  Yes    Pain Score  7     Pain Location  Neck    Pain Orientation  Right;Posterior    Pain Descriptors / Indicators  Aching;Sore    Pain Type  Chronic pain    Pain Onset  More than a month ago    Pain Frequency  Intermittent    Aggravating Factors   prolonged sitting/lying down; worse at night    Pain Relieving Factors  heat/ medication    Effect of Pain on Daily Activities  decreased tolerance with ADLs;           TREATMENT: Patient sitting in chair: PT performed US to right cervical paraspinals including suboccipitals, continuous,2.0watts per centimeter squared, x10 min in sitting; Patient tolerated well reporting slight reduction in neck pain with [place].  Following [place] Patient transitioned tosupine with moist heat to bilateral cervical paraspinals (x3 min, unbilled) -PT performed sub occipital release 30 sec hold x4 reps; -PT performedsoft/deep tissue massage to bilateral cervical paraspinals including upper  trap and levator scapulaex22 min; Also performed ischemic trigger point release to right suboccipital paraspinals. Patient exhibits increased tenderness and tightness in suboccipitals especially on right side; Patient exhibits improved tissue extensibility with less trigger points identified following manual therapy;  Following manual therapy, patient reports continued pain. Reinforced importance of HEP with instruction to continue with chin tuck exercise for better stretch to suboccipitals.  Patient verbalized/demonstrated understanding;                  PT Education - 03/14/18 1137    Education provided  Yes    Education Details  US/manual therapy, postural re-education, HEP reinforced    Person(s) Educated  Patient     Methods  Explanation    Comprehension  Verbalized understanding       PT Short Term Goals - 03/07/18 1322      PT SHORT TERM GOAL #1   Title  Patient will be independent in home exercise program to improve strength/mobility for better functional independence with ADLs.    Time  3    Period  Weeks    Status  On-going    Target Date  04/18/18        PT Long Term Goals - 03/07/18 1322      PT LONG TERM GOAL #1   Title  Patient will report worst pain of less than 5/10 to demonstrate improved tolerance for ADLs.     Baseline  8/21: 10/10; 02/28/18: 10/10; 03/07/18: 10/10    Time  6    Period  Weeks    Status  On-going    Target Date  04/18/18      PT LONG TERM GOAL #2   Title  Patient will report NDI of less than 45% disability to demonstrate improved tolerance for ADLs and improved mobility.    Baseline  8/21: 54%; 02/28/18: 68%, 03/07/18: 66%    Time  6    Period  Weeks    Status  On-going    Target Date  04/18/18      PT LONG TERM GOAL #3   Title  Patient will improve AROM flexion 40 deg, extension 40 deg, B lateral flexion 30 deg, and B rotation 60 deg pain free to demonstrate improved mobility for improved tolerance for ADLs.    Baseline  8/21: flexion 24 deg, ext 14 deg, R side bend 13 deg, L side bend 22 deg, R rot 34 deg, L rot 38 deg; 9/23: flexion 50 deg, ext 31 deg, R side bend 34 deg, L side bend 40 deg, R rot 34 deg, L rot 48 deg    Time  6    Period  Weeks    Status  Partially Met    Target Date  04/18/18            Plan - 03/14/18 1140    Clinical Impression Statement  Patient continues to have high levels of pain especially along right suboccipitals. Patient exhibited increased trigger points to right suboccipitals. She tolerated US/manual therapy well. She reports continued soreness at end of session, however improved tissue extensibility and less tightness noted. Patient instructed to continue with chin tucks as part of HEP to facilitate better  flexibility and reduce tightness. Patient verbalized understanding. She would benefit from additional skilled PT intervention to improve tissue extensibiltiy and reduce neck pain;     Rehab Potential  Fair    Clinical Impairments Affecting Rehab Potential  severe pain, minimal pain relief, varying levels of  mobility, chronicity of neck pain    PT Frequency  2x / week    PT Duration  6 weeks    PT Treatment/Interventions  Biofeedback;Cryotherapy;Electrical Stimulation;Moist Heat;Traction;Ultrasound;Therapeutic activities;Therapeutic exercise;Patient/family education;Manual techniques;Passive range of motion;Dry needling;Energy conservation;Taping    PT Next Visit Plan  TENS unit, ultrasound, heat, pain relief techniques    PT Home Exercise Plan  give at next visit    Consulted and Agree with Plan of Care  Patient       Patient will benefit from skilled therapeutic intervention in order to improve the following deficits and impairments:  Decreased range of motion, Decreased mobility, Decreased strength, Hypomobility, Increased muscle spasms, Postural dysfunction, Pain  Visit Diagnosis: Cervicalgia  Abnormal posture  Difficulty in walking, not elsewhere classified  Chronic midline low back pain without sciatica     Problem List Patient Active Problem List   Diagnosis Date Noted  . DM (diabetes mellitus), type 2 ([place]) 09/08/2016  . COPD exacerbation ([place]) 09/07/2016  . Neck pain 04/02/2016  . Abdominal pain 01/04/2014    Tyeasha Ebbs PT, DPT 03/14/2018, 11:44 AM  [facility] MAIN [facility] SERVICES [address] [place], [place], [address] Phone: [phone]   Fax:  [phone]  Name: Shelly [name] MRN: [id number] Date of Birth: 12/11/1960

## 2018-03-16 ENCOUNTER — Encounter: Payer: Self-pay | Admitting: Physical Therapy

## 2018-03-16 ENCOUNTER — Ambulatory Visit: Payer: Medicare Other | Admitting: Physical Therapy

## 2018-03-16 DIAGNOSIS — R262 Difficulty in walking, not elsewhere classified: Secondary | ICD-10-CM

## 2018-03-16 DIAGNOSIS — R293 Abnormal posture: Secondary | ICD-10-CM

## 2018-03-16 DIAGNOSIS — M542 Cervicalgia: Secondary | ICD-10-CM

## 2018-03-16 NOTE — Therapy (Signed)
[place] MAIN [facility] SERVICES [address] [place], [place], [address] Phone: [phone]   Fax:  [phone]  Physical Therapy Treatment  Patient Details  Name: Shelly [name] MRN: [id number] Date of Birth: February 26, 1961 Referring Provider (PT): Wyatt Portela (Dr. Louisa Second)   Encounter Date: 03/16/2018  PT End of Session - 03/16/18 1332    Visit Number  12    Number of Visits  25    Date for PT Re-Evaluation  04/18/18    Authorization Type  progress note 2/10 goals updated 03/07/18    PT Start Time  1151    PT Stop Time  1234    PT Time Calculation (min)  43 min    Activity Tolerance  Patient limited by pain    Behavior During Therapy  [facility] for tasks assessed/performed       Past Medical History:  Diagnosis Date  . Asthma   . COPD (chronic obstructive pulmonary disease) ([place])   . Diabetes mellitus without complication ([place])   . Hypertension   . Sleep apnea     Past Surgical History:  Procedure Laterality Date  . ABDOMINAL HYSTERECTOMY    . COLON SURGERY      There were no vitals filed for this visit.  Subjective Assessment - 03/16/18 1330    Subjective  Patient reports continued neck pain; She reports that she had less pain after last session but then at night her pain returned. Patient admitted that she often falls asleep in her recliner with her head down which would contribute to increased neck pain/stiffness.     Pertinent History  Patient is a 57 yo female with significant PMH of asthma, COPD, diabetes, and hypertension and presents to PT today with complaints of neck pain. Pt had a fall 12/24/17 getting out of bathtub and has had severe neck pain ever since. Patient is having difficulty sleeping and gets no relief from tramadol or muscle relaxer medication. Pt's orthopedist is having a bone specialist coming to see her in [place] to evaluate bone spur and neck pain further. Referred to PT to try to get some relief before seeing the  bone specialist.    Limitations  Sitting;Reading;Lifting;Standing;Walking;House hold activities    How long can you sit comfortably?  Hours but pain increases    How long can you stand comfortably?  10 minutes    How long can you walk comfortably?  Walk around the house    Diagnostic tests  X-ray showed bone spur on vertebra in neck    Patient Stated Goals  To get rid of the pain     Currently in Pain?  Yes    Pain Score  10-Worst pain ever    Pain Location  Neck    Pain Orientation  Right;Posterior    Pain Descriptors / Indicators  Aching;Sore    Pain Type  Chronic pain    Pain Onset  More than a month ago    Pain Frequency  Intermittent    Aggravating Factors   prolonged sitting/lying down, worse at night    Pain Relieving Factors  heat/medication    Effect of Pain on Daily Activities  decreased tolerance with ADLs    Multiple Pain Sites  No         TREATMENT: Patient supine with moist heat to cervical paraspinals x2 min   PT performed extensive manual therapy to reduce tightness/tenderness to cervical paraspinals including: -suboccipital release 30 sec hold x4 reps -Suboccipital release  concurrent with chin tucks 2x5 reps with mod VCs for correct exercise technique including to avoid cervical flexion but isolate cervical retraction for better extensibility and reduce neck pain; -soft/deep tissue massage to bilateral cervical paraspinals including upper trap, levator scapulae and sub-occipitals. x20 min; utilized manual techniques including cross friction and myofascial release -PT identified trigger points in suboccipitals (R>L) performed ischemic trigger point release 30 sec hold x2 reps bilaterally; -PT performed passive translation of cervical spine 2x5 reps right/left to facilitate better cervical ROM/flexibility; She reports less stiffness following PROM; -PT performed passive upper trap/levator scapulae stretch 30 sec hold x2 reps each, right side only   Following manual  therapy, patient reports significant reduction in pain to 5/10. Patient does exhibit improved tissue extensibility with less tightness and less trigger points identified following manual therapy;  Educated patient that prolonged sitting can increase neck pain; Also recommended patient go to bed prior to falling asleep in recliner as sleeping in chair could likely contribute to neck pain; Patient verbalized understanding; Also reinforced importance of HEP adherence including suboccipital release;                       PT Education - 03/16/18 1332    Education provided  Yes    Education Details  manual therapy, HEP reinforced, postural re-education    Person(s) Educated  Patient    Methods  Explanation;Verbal cues    Comprehension  Verbalized understanding;Returned demonstration;Verbal cues required;Need further instruction       PT Short Term Goals - 03/07/18 1322      PT SHORT TERM GOAL #1   Title  Patient will be independent in home exercise program to improve strength/mobility for better functional independence with ADLs.    Time  3    Period  Weeks    Status  On-going    Target Date  04/18/18        PT Long Term Goals - 03/07/18 1322      PT LONG TERM GOAL #1   Title  Patient will report worst pain of less than 5/10 to demonstrate improved tolerance for ADLs.     Baseline  8/21: 10/10; 02/28/18: 10/10; 03/07/18: 10/10    Time  6    Period  Weeks    Status  On-going    Target Date  04/18/18      PT LONG TERM GOAL #2   Title  Patient will report NDI of less than 45% disability to demonstrate improved tolerance for ADLs and improved mobility.    Baseline  8/21: 54%; 02/28/18: 68%, 03/07/18: 66%    Time  6    Period  Weeks    Status  On-going    Target Date  04/18/18      PT LONG TERM GOAL #3   Title  Patient will improve AROM flexion 40 deg, extension 40 deg, B lateral flexion 30 deg, and B rotation 60 deg pain free to demonstrate improved mobility for  improved tolerance for ADLs.    Baseline  8/21: flexion 24 deg, ext 14 deg, R side bend 13 deg, L side bend 22 deg, R rot 34 deg, L rot 38 deg; 9/23: flexion 50 deg, ext 31 deg, R side bend 34 deg, L side bend 40 deg, R rot 34 deg, L rot 48 deg    Time  6    Period  Weeks    Status  Partially Met    Target Date  04/18/18  Plan - 03/16/18 1332    Clinical Impression Statement  Patient reports increased tenderness to palpation to right suboccipital muscles. Tenderness was relieved with manual therapy including ischemic trigger point release, cross friction massage and myofacial release. Patient reports less pain at end of session. She reports that she often falls asleep in her recliner. Re-educated patient in erect posture and importance of not sleeping sitting up but rather going to bed. Patient verbalized understanding.     Rehab Potential  Fair    Clinical Impairments Affecting Rehab Potential  severe pain, minimal pain relief, varying levels of mobility, chronicity of neck pain    PT Frequency  2x / week    PT Duration  6 weeks    PT Treatment/Interventions  Biofeedback;Cryotherapy;Electrical Stimulation;Moist Heat;Traction;Ultrasound;Therapeutic activities;Therapeutic exercise;Patient/family education;Manual techniques;Passive range of motion;Dry needling;Energy conservation;Taping    PT Next Visit Plan  TENS unit, ultrasound, heat, pain relief techniques    PT Home Exercise Plan  give at next visit    Consulted and Agree with Plan of Care  Patient       Patient will benefit from skilled therapeutic intervention in order to improve the following deficits and impairments:  Decreased range of motion, Decreased mobility, Decreased strength, Hypomobility, Increased muscle spasms, Postural dysfunction, Pain  Visit Diagnosis: Cervicalgia  Abnormal posture  Difficulty in walking, not elsewhere classified     Problem List Patient Active Problem List   Diagnosis Date  Noted  . DM (diabetes mellitus), type 2 ([place]) 09/08/2016  . COPD exacerbation ([place]) 09/07/2016  . Neck pain 04/02/2016  . Abdominal pain 01/04/2014    Fraidy Mccarrick PT, DPT 03/16/2018, 1:34 PM  [place] MAIN [facility] SERVICES [address] [place], [place], [address] Phone: [phone]   Fax:  [phone]  Name: Shelly [name] MRN: [id number] Date of Birth: 09-28-1960

## 2018-03-21 ENCOUNTER — Encounter: Payer: Self-pay | Admitting: Physical Therapy

## 2018-03-21 ENCOUNTER — Ambulatory Visit: Payer: Medicare Other | Admitting: Physical Therapy

## 2018-03-21 DIAGNOSIS — M542 Cervicalgia: Secondary | ICD-10-CM | POA: Diagnosis not present

## 2018-03-21 DIAGNOSIS — R262 Difficulty in walking, not elsewhere classified: Secondary | ICD-10-CM

## 2018-03-21 DIAGNOSIS — R293 Abnormal posture: Secondary | ICD-10-CM

## 2018-03-21 NOTE — Therapy (Addendum)
[place] MAIN [facility] SERVICES [address] [place], [place], [address] Phone: [phone]   Fax:  [phone]  Physical Therapy Treatment  Patient Details  Name: Shelly [name] MRN: [id number]  Date of Birth: June 13, 1960 Referring Provider (PT): Wyatt Portela (Dr. Louisa Second)   Encounter Date: 03/21/2018  PT End of Session - 03/21/18 1530    Visit Number  13    Number of Visits  25    Date for PT Re-Evaluation  04/18/18    Authorization Type  progress note 3/10 goals updated 03/07/18    PT Start Time  1517    PT Stop Time  1603    PT Time Calculation (min)  46 min    Activity Tolerance  Patient limited by pain    Behavior During Therapy  [facility] for tasks assessed/performed       Past Medical History:  Diagnosis Date  . Asthma   . COPD (chronic obstructive pulmonary disease) ([place])   . Diabetes mellitus without complication ([place])   . Hypertension   . Sleep apnea     Past Surgical History:  Procedure Laterality Date  . ABDOMINAL HYSTERECTOMY    . COLON SURGERY      There were no vitals filed for this visit.  Subjective Assessment - 03/21/18 1523    Subjective  Patient reports that she is not getting much sleep because she is having difficulty getting her neck comfortable in the upright chair which she sleeps in to ease her breathing difficulties.     Pertinent History  Patient is a 57 yo female with significant PMH of asthma, COPD, diabetes, and hypertension and presents to PT today with complaints of neck pain. Pt had a fall 12/24/17 getting out of bathtub and has had severe neck pain ever since. Patient is having difficulty sleeping and gets no relief from tramadol or muscle relaxer medication. Pt's orthopedist is having a bone specialist coming to see her in [place] to evaluate bone spur and neck pain further. Referred to PT to try to get some relief before seeing the bone specialist.    Limitations   Sitting;Reading;Lifting;Standing;Walking;House hold activities    How long can you sit comfortably?  Hours but pain increases    How long can you stand comfortably?  10 minutes    How long can you walk comfortably?  Walk around the house    Diagnostic tests  X-ray showed bone spur on vertebra in neck    Patient Stated Goals  To get rid of the pain     Currently in Pain?  Yes    Pain Score  8     Pain Location  Neck    Pain Orientation  Right;Posterior    Pain Descriptors / Indicators  Aching    Pain Type  Chronic pain    Pain Onset  More than a month ago    Pain Frequency  Intermittent    Multiple Pain Sites  Yes    Pain Score  7    Pain Location  Head    Pain Descriptors / Indicators  Aching    Pain Type  Chronic pain    Pain Onset  More than a month ago    Pain Frequency  Intermittent       Treatment Patient supine with moist heat to cervical paraspinals x2 min    PT performed extensive manual therapy to reduce tightness/tenderness to cervical paraspinals including: -suboccipital release 30 sec hold x4 reps -Suboccipital  release concurrent with chin tucks 2x5 reps with mod VCs for correct exercise technique including to avoid cervical flexion; -soft/deep tissue massage to bilateral cervical paraspinals including upper trap, levator scapulae and sub-occipitals. x20 min; utilized manual techniques including cross friction, instrument-assisted STM, and myofascial release -PT identified trigger points in upper trapezius (R>L) performed ischemic trigger point release 30 sec hold x2 reps bilaterally; -PT performed passive upper trap/levator scapulae stretch 30 sec hold x2 reps each side    Following manual therapy, patient reports significant reduction in pain to 6/10. Patient does exhibit improved tissue extensibility with less tightness and less trigger points identified following manual therapy;    PT Education - 03/21/18 1527    Education provided  Yes    Education Details   sleep positioning, manual therapy, HEP reinforced, postural re-education    Person(s) Educated  Patient    Methods  Explanation;Verbal cues    Comprehension  Verbalized understanding;Returned demonstration;Need further instruction       PT Short Term Goals - 03/07/18 1322      PT SHORT TERM GOAL #1   Title  Patient will be independent in home exercise program to improve strength/mobility for better functional independence with ADLs.    Time  3    Period  Weeks    Status  On-going    Target Date  04/18/18        PT Long Term Goals - 03/07/18 1322      PT LONG TERM GOAL #1   Title  Patient will report worst pain of less than 5/10 to demonstrate improved tolerance for ADLs.     Baseline  8/21: 10/10; 02/28/18: 10/10; 03/07/18: 10/10    Time  6    Period  Weeks    Status  On-going    Target Date  04/18/18      PT LONG TERM GOAL #2   Title  Patient will report NDI of less than 45% disability to demonstrate improved tolerance for ADLs and improved mobility.    Baseline  8/21: 54%; 02/28/18: 68%, 03/07/18: 66%    Time  6    Period  Weeks    Status  On-going    Target Date  04/18/18      PT LONG TERM GOAL #3   Title  Patient will improve AROM flexion 40 deg, extension 40 deg, B lateral flexion 30 deg, and B rotation 60 deg pain free to demonstrate improved mobility for improved tolerance for ADLs.    Baseline  8/21: flexion 24 deg, ext 14 deg, R side bend 13 deg, L side bend 22 deg, R rot 34 deg, L rot 38 deg; 9/23: flexion 50 deg, ext 31 deg, R side bend 34 deg, L side bend 40 deg, R rot 34 deg, L rot 48 deg    Time  6    Period  Weeks    Status  Partially Met    Target Date  04/18/18         Plan - 03/21/18 1623    Clinical Impression Statement  Pt reports increased tenderness at base of skull bilaterally as well as across shoulder blades. Tenderness was decreased with manual therapy including IASTM due to pt's increased tenderness to pressure along upper trapezius. Patient  reports less pain at end of session but does believe it will come back within a few hours. Pt states she no longer has recliner but a straight-back chair; reports she does not fall asleep in it because it  is uncomfortable. Pt states she does fall asleep standing up at times and has resulted in a fall (circa 06/2017); educated pt on importance of good sleeping position in bed for muscles to rest. Pt verbalized understanding and will continue to benefit from skilled PT for improvements in neck pain and mobility.    Rehab Potential  Fair    Clinical Impairments Affecting Rehab Potential  severe pain, minimal pain relief, varying levels of mobility, chronicity of neck pain    PT Frequency  2x / week    PT Duration  6 weeks    PT Treatment/Interventions  Biofeedback;Cryotherapy;Electrical Stimulation;Moist Heat;Traction;Ultrasound;Therapeutic activities;Therapeutic exercise;Patient/family education;Manual techniques;Passive range of motion;Dry needling;Energy conservation;Taping    PT Next Visit Plan  TENS unit, ultrasound, heat, pain relief techniques    PT Home Exercise Plan  give at next visit    Consulted and Agree with Plan of Care  Patient       Patient will benefit from skilled therapeutic intervention in order to improve the following deficits and impairments:  Decreased range of motion, Decreased mobility, Decreased strength, Hypomobility, Increased muscle spasms, Postural dysfunction, Pain  Visit Diagnosis: Cervicalgia  Abnormal posture  Difficulty in walking, not elsewhere classified     Problem List Patient Active Problem List   Diagnosis Date Noted  . DM (diabetes mellitus), type 2 ([place]) 09/08/2016  . COPD exacerbation ([place]) 09/07/2016  . Neck pain 04/02/2016  . Abdominal pain 01/04/2014   Harriet Masson, SPT  This entire session was performed under direct supervision and direction of a licensed therapist/therapist assistant . I have personally read, edited and approve of the  note as written.  Myles Gip PT, DPT [phone] 03/21/2018, 4:32 PM  [place] MAIN [facility] SERVICES [address] [place], [place], [address] Phone: [phone]   Fax:  [phone]  Name: MARISOL GIAMBRA MRN: [id number] Date of Birth: 26-Jun-1960

## 2018-03-23 ENCOUNTER — Ambulatory Visit: Payer: Medicare Other | Admitting: Physical Therapy

## 2018-03-24 ENCOUNTER — Telehealth: Payer: Self-pay | Admitting: Adult Health

## 2018-03-24 ENCOUNTER — Encounter: Payer: Self-pay | Admitting: Internal Medicine

## 2018-03-24 ENCOUNTER — Ambulatory Visit (INDEPENDENT_AMBULATORY_CARE_PROVIDER_SITE_OTHER): Payer: Medicare Other | Admitting: Internal Medicine

## 2018-03-24 VITALS — BP 140/72 | Resp 20 | Ht 65.0 in | Wt 206.0 lb

## 2018-03-24 DIAGNOSIS — R0602 Shortness of breath: Secondary | ICD-10-CM

## 2018-03-24 DIAGNOSIS — Z9981 Dependence on supplemental oxygen: Secondary | ICD-10-CM

## 2018-03-24 DIAGNOSIS — J9611 Chronic respiratory failure with hypoxia: Secondary | ICD-10-CM | POA: Diagnosis not present

## 2018-03-24 DIAGNOSIS — J449 Chronic obstructive pulmonary disease, unspecified: Secondary | ICD-10-CM

## 2018-03-24 DIAGNOSIS — R05 Cough: Secondary | ICD-10-CM

## 2018-03-24 DIAGNOSIS — J069 Acute upper respiratory infection, unspecified: Secondary | ICD-10-CM

## 2018-03-24 DIAGNOSIS — R600 Localized edema: Secondary | ICD-10-CM

## 2018-03-24 DIAGNOSIS — G4733 Obstructive sleep apnea (adult) (pediatric): Secondary | ICD-10-CM | POA: Diagnosis not present

## 2018-03-24 DIAGNOSIS — R059 Cough, unspecified: Secondary | ICD-10-CM

## 2018-03-24 MED ORDER — PREDNISONE 10 MG PO TABS: ORAL_TABLET | ORAL | 0 refills | Status: DC

## 2018-03-24 MED ORDER — HYDROCOD POLST-CPM POLST ER 10-8 MG/5ML PO SUER: 5.0000 mL | Freq: Two times a day (BID) | ORAL | 0 refills | Status: DC | PRN

## 2018-03-24 MED ORDER — LEVOFLOXACIN 500 MG PO TABS: 500.0000 mg | ORAL_TABLET | Freq: Every day | ORAL | 0 refills | Status: DC

## 2018-03-24 NOTE — Progress Notes (Signed)
[facility] [place], [place] [address]  Pulmonary Sleep Medicine   Office Visit Note  Patient Name: Shelly [name] DOB: [birth year] MRN [id number]  Date of Service: 03/24/2018  Complaints/HPI: Pt here for follow up at this time. She has a history of COPD, OSA, and Chronic Respiratory failure with hypoxia. She reports overall she is doing well.  She is complaining of cough, and chest congestion at this time.  She reports using her inhalers and nebulizers as prescribed. She also has increased edema in her feet and lower extremities. She has not had any recent hospitalizations.  No chest pain, hemoptysis, fever or other complaints.  She continues to use her oxygen at night, on 3 L via nasal cannula.     ROS  General: (-) fever, (-) chills, (-) night sweats, (-) weakness Skin: (-) rashes, (-) itching,. Eyes: (-) visual changes, (-) redness, (-) itching. Nose and Sinuses: (-) nasal stuffiness or itchiness, (-) postnasal drip, (-) nosebleeds, (-) sinus trouble. Mouth and Throat: (-) sore throat, (-) hoarseness. Neck: (-) swollen glands, (-) enlarged thyroid, (-) neck pain. Respiratory: + cough, (-) bloody sputum, + shortness of breath, - wheezing. Cardiovascular: - ankle swelling, (-) chest pain. Lymphatic: (-) lymph node enlargement. Neurologic: (-) numbness, (-) tingling. Psychiatric: (-) anxiety, (-) depression   Current Medication: Outpatient Encounter Medications as of 03/24/2018  Medication Sig  . fluticasone furoate-vilanterol (BREO ELLIPTA) 200-25 MCG/INH AEPB Inhale 1 puff into the lungs daily.  . furosemide (LASIX) 20 MG tablet Take 20 mg by mouth daily.  Marland Kitchen ipratropium-albuterol (DUONEB) 0.5-2.5 (3) MG/3ML SOLN Take 3 mLs by nebulization every 6 (six) hours as needed.  Marland Kitchen lisinopril (PRINIVIL,ZESTRIL) 10 MG tablet Take 20 mg by mouth daily.  Marland Kitchen LORazepam (ATIVAN) 0.5 MG tablet Take 0.5 mg by mouth every 8 (eight) hours as needed for anxiety.  .  traMADol (ULTRAM) 50 MG tablet Take 50 mg by mouth every 6 (six) hours as needed.  . VENTOLIN HFA 108 (90 Base) MCG/ACT inhaler   . levofloxacin (LEVAQUIN) 500 MG tablet Take 1 tablet (500 mg total) by mouth daily. (Patient not taking: Reported on 12/12/2017)  . oxyCODONE-acetaminophen (PERCOCET) 7.5-325 MG tablet Take 1 tablet by mouth every 6 (six) hours as needed for severe pain. (Patient not taking: Reported on 09/09/2017)  . [DISCONTINUED] metFORMIN (GLUCOPHAGE) 500 MG tablet Take 1 tablet (500 mg total) by mouth 2 (two) times daily with a meal. (Patient not taking: Reported on 09/09/2017)  . [DISCONTINUED] ondansetron (ZOFRAN ODT) 4 MG disintegrating tablet Take 1 tablet (4 mg total) by mouth every 8 (eight) hours as needed for nausea or vomiting. (Patient not taking: Reported on 09/09/2017)  . [DISCONTINUED] oseltamivir (TAMIFLU) 75 MG capsule Take 1 capsule (75 mg total) by mouth 2 (two) times daily. (Patient not taking: Reported on 09/09/2017)  . [DISCONTINUED] predniSONE (STERAPRED UNI-PAK 21 TAB) 10 MG (21) TBPK tablet As directed (Patient not taking: Reported on 01/26/2018)   No facility-administered encounter medications on file as of 03/24/2018.     Surgical History: Past Surgical History:  Procedure Laterality Date  . ABDOMINAL HYSTERECTOMY    . COLON SURGERY      Medical History: Past Medical History:  Diagnosis Date  . Asthma   . COPD (chronic obstructive pulmonary disease) ([place])   . Diabetes mellitus without complication ([place])   . Hypertension   . Sleep apnea     Family History: Family History  Adopted: Yes  Family history unknown: Yes  Social History: Social History   Socioeconomic History  . Marital status: Married    Spouse name: Not on file  . Number of children: Not on file  . Years of education: Not on file  . Highest education level: Not on file  Occupational History  . Not on file  Social Needs  . Financial resource strain: Not on file  . Food  insecurity:    Worry: Not on file    Inability: Not on file  . Transportation needs:    Medical: Not on file    Non-medical: Not on file  Tobacco Use  . Smoking status: Former Research scientist (life sciences)  . Smokeless tobacco: Never Used  Substance and Sexual Activity  . Alcohol use: No  . Drug use: No  . Sexual activity: Not on file  Lifestyle  . Physical activity:    Days per week: Not on file    Minutes per session: Not on file  . Stress: Not on file  Relationships  . Social connections:    Talks on phone: Not on file    Gets together: Not on file    Attends religious service: Not on file    Active member of club or organization: Not on file    Attends meetings of clubs or organizations: Not on file    Relationship status: Not on file  . Intimate partner violence:    Fear of current or ex partner: Not on file    Emotionally abused: Not on file    Physically abused: Not on file    Forced sexual activity: Not on file  Other Topics Concern  . Not on file  Social History Narrative  . Not on file    Vital Signs: Blood pressure 140/72, resp. rate 20, height 5\' 5"  (1.651 m), weight 206 lb (93.4 kg).  Examination: General Appearance: The patient is well-developed, well-nourished, and in no distress. Skin: Gross inspection of skin unremarkable. Head: normocephalic, no gross deformities. Eyes: no gross deformities noted. ENT: ears appear grossly normal no exudates. Neck: Supple. No thyromegaly. No LAD. Respiratory: . Cardiovascular: Normal S1 and S2 without murmur or rub. Extremities: No cyanosis. pulses are equal. Neurologic: Alert and oriented. No involuntary movements.  LABS: No results found for this or any previous visit (from the past 2160 hour(s)).  Radiology: Ct Wrist Left Wo Contrast  Result Date: 03/01/2018 CLINICAL DATA:  Lateral wrist pain and limited range of motion after falling 9 weeks ago and fracturing wrist. EXAM: CT OF THE LEFT WRIST WITHOUT CONTRAST TECHNIQUE:  Multidetector CT imaging was performed according to the standard protocol. Multiplanar CT image reconstructions were also generated. COMPARISON:  None. FINDINGS: Bones/Joint/Cartilage There is transverse sclerosis posteriorly in the distal radial metaphysis, likely reflecting a healed or healing fracture of the distal radius. There is no cortical offset. The distal articular surface appears intact. The distal ulna appears intact. No evidence of carpal bone fracture or dislocation. There is a small radiocarpal joint effusion. Ligaments Suboptimally assessed by CT. Muscles and Tendons Unremarkable. Soft tissues No hematoma or other focal soft tissue abnormality identified. IMPRESSION: 1. Sclerosis of the distal radius consistent with a healed or healing fracture. 2. No acute fracture or subluxation identified. 3. Comparison with previous imaging recommended. Electronically Signed   By: Richardean Sale M.D.   On: 03/01/2018 16:58    No results found.  Ct Wrist Left Wo Contrast  Result Date: 03/01/2018 CLINICAL DATA:  Lateral wrist pain and limited range of motion after falling 9  weeks ago and fracturing wrist. EXAM: CT OF THE LEFT WRIST WITHOUT CONTRAST TECHNIQUE: Multidetector CT imaging was performed according to the standard protocol. Multiplanar CT image reconstructions were also generated. COMPARISON:  None. FINDINGS: Bones/Joint/Cartilage There is transverse sclerosis posteriorly in the distal radial metaphysis, likely reflecting a healed or healing fracture of the distal radius. There is no cortical offset. The distal articular surface appears intact. The distal ulna appears intact. No evidence of carpal bone fracture or dislocation. There is a small radiocarpal joint effusion. Ligaments Suboptimally assessed by CT. Muscles and Tendons Unremarkable. Soft tissues No hematoma or other focal soft tissue abnormality identified. IMPRESSION: 1. Sclerosis of the distal radius consistent with a healed or healing  fracture. 2. No acute fracture or subluxation identified. 3. Comparison with previous imaging recommended. Electronically Signed   By: Richardean Sale M.D.   On: 03/01/2018 16:58      Assessment and Plan: Patient Active Problem List   Diagnosis Date Noted  . DM (diabetes mellitus), type 2 ([place]) 09/08/2016  . COPD exacerbation ([place]) 09/07/2016  . Neck pain 04/02/2016  . Abdominal pain 01/04/2014   1. Obstructive chronic bronchitis without exacerbation ([place]) We will continue current regimen.  Her obstructive bronchitis appears to be stable at this time.  2. Upper respiratory tract infection, unspecified type Course of Levaquin and prednisone prescribed for upper respiratory infection.  Return to clinic if symptoms do not improve. - levofloxacin (LEVAQUIN) 500 MG tablet; Take 1 tablet (500 mg total) by mouth daily.  Dispense: 10 tablet; Refill: 0 - predniSONE (DELTASONE) 10 MG tablet; Use per dose pack  Dispense: 21 tablet; Refill: 0  3. OSA (obstructive sleep apnea) Continue with present management.  Encouraged continued CPAP use.  4. Chronic respiratory failure with hypoxia ([place]) Patient continues to be dependent upon oxygen, continue current therapy as prescribed.  5. Oxygen dependent Continue to use oxygen at 2 L via nasal cannula, at night and as needed throughout the day.  6. Morbid obesity ([place]) Obesity Counseling: Risk Assessment: An assessment of behavioral risk factors was made today and includes lack of exercise sedentary lifestyle, lack of portion control and poor dietary habits.  Risk Modification Advice: She was counseled on portion control guidelines. Restricting daily caloric intake to. . The detrimental long term effects of obesity on her health and ongoing poor compliance was also discussed with the patient.  7. Edema of both legs Patient continues to have bilateral lower extremity edema refractory to Lasix use.  Patient reports the edema is rising up over her  knee.  Will obtain echo to evaluate cardiac function. - ECHOCARDIOGRAM COMPLETE; Future  8. Shortness of breath - Spirometry with Graph  9. Cough Patient prescribed Tussionex.  Discussed safety while using cough syrup. - chlorpheniramine-HYDROcodone (TUSSIONEX PENNKINETIC ER) 10-8 MG/5ML SUER; Take 5 mLs by mouth every 12 (twelve) hours as needed for cough.  Dispense: 115 mL; Refill: 0 Reviewed risks and possible side effects associated with taking opiates, benzodiazepines and other CNS depressants. Combination of these could cause dizziness and drowsiness. Advised patient not to drive or operate machinery when taking these medications, as patient's and other's life can be at risk and will have consequences. Patient verbalized understanding in this matter. Dependence and abuse for these drugs will be monitored closely. A Controlled substance policy and procedure is on file which allows [facility] to order a urine drug screen test at any visit. Patient understands and agrees with the plan     General Counseling:  I have discussed the findings of the evaluation and examination with Thayer Headings.  I have also discussed any further diagnostic evaluation thatmay be needed or ordered today. [place] verbalizes understanding of the findings of todays visit. We also reviewed her medications today and discussed drug interactions and side effects including but not limited excessive drowsiness and altered mental states. We also discussed that there is always a risk not just to her but also people around her. she has been encouraged to call the office with any questions or concerns that should arise related to todays visit.    Time spent: 25 This patient was seen by Orson Gear AGNP-C in Collaboration with Dr. Devona Konig as a part of collaborative care agreement.   I have personally obtained a history, examined the patient, evaluated laboratory and imaging results, formulated the assessment and plan  and placed orders.    [name], MD [facility] Pulmonary and Critical Care Sleep medicine

## 2018-03-24 NOTE — Telephone Encounter (Signed)
Left message on voicmail regarding tussionex medication to be d/c

## 2018-03-24 NOTE — Patient Instructions (Signed)
Upper Respiratory Infection, Adult Most upper respiratory infections (URIs) are caused by a virus. A URI affects the nose, throat, and upper air passages. The most common type of URI is often called "the common cold." Follow these instructions at home:  Take medicines only as told by your doctor.  Gargle warm saltwater or take cough drops to comfort your throat as told by your doctor.  Use a warm mist humidifier or inhale steam from a shower to increase air moisture. This may make it easier to breathe.  Drink enough fluid to keep your pee (urine) clear or pale yellow.  Eat soups and other clear broths.  Have a healthy diet.  Rest as needed.  Go back to work when your fever is gone or your doctor says it is okay. ? You may need to stay home longer to avoid giving your URI to others. ? You can also wear a face mask and wash your hands often to prevent spread of the virus.  Use your inhaler more if you have asthma.  Do not use any tobacco products, including cigarettes, chewing tobacco, or electronic cigarettes. If you need help quitting, ask your doctor. Contact a doctor if:  You are getting worse, not better.  Your symptoms are not helped by medicine.  You have chills.  You are getting more short of breath.  You have brown or red mucus.  You have yellow or brown discharge from your nose.  You have pain in your face, especially when you bend forward.  You have a fever.  You have puffy (swollen) neck glands.  You have pain while swallowing.  You have white areas in the back of your throat. Get help right away if:  You have very bad or constant: ? Headache. ? Ear pain. ? Pain in your forehead, behind your eyes, and over your cheekbones (sinus pain). ? Chest pain.  You have long-lasting (chronic) lung disease and any of the following: ? Wheezing. ? Long-lasting cough. ? Coughing up blood. ? A change in your usual mucus.  You have a stiff neck.  You have  changes in your: ? Vision. ? Hearing. ? Thinking. ? Mood. This information is not intended to replace advice given to you by your health care provider. Make sure you discuss any questions you have with your health care provider. Document Released: 11/11/2007 Document Revised: 01/26/2016 Document Reviewed: 08/30/2013 Elsevier Interactive Patient Education  2018 [employer]  

## 2018-03-28 ENCOUNTER — Ambulatory Visit: Payer: Medicare Other | Admitting: Physical Therapy

## 2018-03-30 ENCOUNTER — Ambulatory Visit: Payer: Medicare Other | Admitting: Physical Therapy

## 2018-04-15 ENCOUNTER — Ambulatory Visit: Payer: Medicare Other

## 2018-04-15 DIAGNOSIS — R600 Localized edema: Secondary | ICD-10-CM

## 2018-06-20 DIAGNOSIS — M5481 Occipital neuralgia: Secondary | ICD-10-CM | POA: Insufficient documentation

## 2018-06-20 DIAGNOSIS — M47812 Spondylosis without myelopathy or radiculopathy, cervical region: Secondary | ICD-10-CM | POA: Insufficient documentation

## 2018-06-20 DIAGNOSIS — M1812 Unilateral primary osteoarthritis of first carpometacarpal joint, left hand: Secondary | ICD-10-CM | POA: Insufficient documentation

## 2018-07-11 ENCOUNTER — Other Ambulatory Visit: Payer: Self-pay | Admitting: Physical Medicine and Rehabilitation

## 2018-07-11 DIAGNOSIS — M5412 Radiculopathy, cervical region: Secondary | ICD-10-CM

## 2018-07-20 ENCOUNTER — Ambulatory Visit
Admission: RE | Admit: 2018-07-20 | Discharge: 2018-07-20 | Disposition: A | Discharge status: Home / Self Care | Payer: Medicare Other | Source: Ambulatory Visit | Attending: Physical Medicine and Rehabilitation | Admitting: Physical Medicine and Rehabilitation

## 2018-07-20 DIAGNOSIS — M5412 Radiculopathy, cervical region: Secondary | ICD-10-CM | POA: Insufficient documentation

## 2018-08-01 ENCOUNTER — Other Ambulatory Visit: Payer: Self-pay | Admitting: Primary Care

## 2018-08-01 DIAGNOSIS — Z1231 Encounter for screening mammogram for malignant neoplasm of breast: Secondary | ICD-10-CM

## 2018-08-12 ENCOUNTER — Ambulatory Visit
Admission: RE | Admit: 2018-08-12 | Discharge: 2018-08-12 | Disposition: A | Discharge status: Home / Self Care | Payer: Medicare Other | Source: Ambulatory Visit | Attending: Primary Care | Admitting: Primary Care

## 2018-08-12 DIAGNOSIS — Z1231 Encounter for screening mammogram for malignant neoplasm of breast: Secondary | ICD-10-CM | POA: Diagnosis present

## 2018-08-15 ENCOUNTER — Ambulatory Visit
Admission: RE | Admit: 2018-08-15 | Discharge: 2018-08-15 | Disposition: A | Discharge status: Home / Self Care | Payer: Medicare Other | Source: Ambulatory Visit | Attending: Adult Health | Admitting: Adult Health

## 2018-08-15 ENCOUNTER — Ambulatory Visit: Payer: Medicare Other | Admitting: Internal Medicine

## 2018-08-15 ENCOUNTER — Other Ambulatory Visit: Payer: Self-pay

## 2018-08-15 ENCOUNTER — Encounter: Payer: Self-pay | Admitting: Internal Medicine

## 2018-08-15 ENCOUNTER — Ambulatory Visit
Admission: RE | Admit: 2018-08-15 | Discharge: 2018-08-15 | Disposition: A | Discharge status: Home / Self Care | Payer: Medicare Other | Attending: Adult Health | Admitting: Adult Health

## 2018-08-15 VITALS — BP 128/94 | HR 98 | Temp 97.6°F | Resp 16 | Ht 65.0 in | Wt 208.0 lb

## 2018-08-15 DIAGNOSIS — J449 Chronic obstructive pulmonary disease, unspecified: Secondary | ICD-10-CM | POA: Diagnosis not present

## 2018-08-15 DIAGNOSIS — J988 Other specified respiratory disorders: Secondary | ICD-10-CM

## 2018-08-15 DIAGNOSIS — Z9981 Dependence on supplemental oxygen: Secondary | ICD-10-CM | POA: Diagnosis not present

## 2018-08-15 DIAGNOSIS — R05 Cough: Secondary | ICD-10-CM

## 2018-08-15 DIAGNOSIS — R0602 Shortness of breath: Secondary | ICD-10-CM | POA: Diagnosis present

## 2018-08-15 DIAGNOSIS — J9611 Chronic respiratory failure with hypoxia: Secondary | ICD-10-CM | POA: Diagnosis not present

## 2018-08-15 DIAGNOSIS — R059 Cough, unspecified: Secondary | ICD-10-CM

## 2018-08-15 MED ORDER — HYDROCOD POLST-CPM POLST ER 10-8 MG/5ML PO SUER: 5.0000 mL | Freq: Two times a day (BID) | ORAL | 0 refills | Status: DC | PRN

## 2018-08-15 MED ORDER — PREDNISONE 10 MG PO TABS: ORAL_TABLET | ORAL | 0 refills | Status: DC

## 2018-08-15 MED ORDER — METHYLPREDNISOLONE ACETATE 80 MG/ML IJ SUSP
80.0000 mg | Freq: Once | INTRAMUSCULAR | Status: AC
  Administered 2018-08-15: 40 mg via INTRAMUSCULAR

## 2018-08-15 NOTE — Progress Notes (Signed)
[facility] [place], [place] [address]  Pulmonary Sleep Medicine   Office Visit Note  Patient Name: Shelly [name] DOB: 1960/09/20 MRN [id number]  Date of Service: 08/15/2018  Complaints/HPI: Pt is here for one month of cough/congestion.  Pt reports she has taken doxycycline/prednisione with mild success.  She was then placed on Levaquin which she completed about a week ago.  She reports she is no better.  She reports feeling fever, and coughing to the point of sore throat.  Her room air oxygen saturation is 88% at todays visit.   ROS  General: (-) fever, (-) chills, (-) night sweats, (-) weakness Skin: (-) rashes, (-) itching,. Eyes: (-) visual changes, (-) redness, (-) itching. Nose and Sinuses: (-) nasal stuffiness or itchiness, (-) postnasal drip, (-) nosebleeds, (-) sinus trouble. Mouth and Throat: (-) sore throat, (-) hoarseness. Neck: (-) swollen glands, (-) enlarged thyroid, (-) neck pain. Respiratory: + cough, (-) bloody sputum, + shortness of breath, + wheezing. Cardiovascular: - ankle swelling, (-) chest pain. Lymphatic: (-) lymph node enlargement. Neurologic: (-) numbness, (-) tingling. Psychiatric: (-) anxiety, (-) depression   Current Medication: Outpatient Encounter Medications as of 08/15/2018  Medication Sig  . chlorpheniramine-HYDROcodone (TUSSIONEX PENNKINETIC ER) 10-8 MG/5ML SUER Take 5 mLs by mouth every 12 (twelve) hours as needed for cough.  . fluticasone furoate-vilanterol (BREO ELLIPTA) 200-25 MCG/INH AEPB Inhale 1 puff into the lungs daily.  . furosemide (LASIX) 20 MG tablet Take 20 mg by mouth daily.  Marland Kitchen ipratropium-albuterol (DUONEB) 0.5-2.5 (3) MG/3ML SOLN Take 3 mLs by nebulization every 6 (six) hours as needed.  Marland Kitchen lisinopril (PRINIVIL,ZESTRIL) 10 MG tablet Take 20 mg by mouth daily.  Marland Kitchen LORazepam (ATIVAN) 0.5 MG tablet Take 0.5 mg by mouth every 8 (eight) hours as needed for anxiety.  Marland Kitchen oxyCODONE-acetaminophen (PERCOCET)  7.5-325 MG tablet Take 1 tablet by mouth every 6 (six) hours as needed for severe pain.  . OXYGEN Inhale 3 L into the lungs.  . traMADol (ULTRAM) 50 MG tablet Take 50 mg by mouth every 6 (six) hours as needed.  . VENTOLIN HFA 108 (90 Base) MCG/ACT inhaler   . [DISCONTINUED] levofloxacin (LEVAQUIN) 500 MG tablet Take 1 tablet (500 mg total) by mouth daily. (Patient not taking: Reported on 08/15/2018)  . [DISCONTINUED] predniSONE (DELTASONE) 10 MG tablet Use per dose pack (Patient not taking: Reported on 08/15/2018)   No facility-administered encounter medications on file as of 08/15/2018.     Surgical History: Past Surgical History:  Procedure Laterality Date  . ABDOMINAL HYSTERECTOMY    . COLON SURGERY      Medical History: Past Medical History:  Diagnosis Date  . Asthma   . COPD (chronic obstructive pulmonary disease) ([place])   . Diabetes mellitus without complication ([place])   . Hypertension   . Sleep apnea     Family History: Family History  Adopted: Yes  Family history unknown: Yes    Social History: Social History   Socioeconomic History  . Marital status: Married    Spouse name: Not on file  . Number of children: Not on file  . Years of education: Not on file  . Highest education level: Not on file  Occupational History  . Not on file  Social Needs  . Financial resource strain: Not on file  . Food insecurity:    Worry: Not on file    Inability: Not on file  . Transportation needs:    Medical: Not on file  Non-medical: Not on file  Tobacco Use  . Smoking status: Former Research scientist (life sciences)  . Smokeless tobacco: Never Used  Substance and Sexual Activity  . Alcohol use: No  . Drug use: No  . Sexual activity: Not on file  Lifestyle  . Physical activity:    Days per week: Not on file    Minutes per session: Not on file  . Stress: Not on file  Relationships  . Social connections:    Talks on phone: Not on file    Gets together: Not on file    Attends religious service:  Not on file    Active member of club or organization: Not on file    Attends meetings of clubs or organizations: Not on file    Relationship status: Not on file  . Intimate partner violence:    Fear of current or ex partner: Not on file    Emotionally abused: Not on file    Physically abused: Not on file    Forced sexual activity: Not on file  Other Topics Concern  . Not on file  Social History Narrative  . Not on file    Vital Signs: Blood pressure (!) 128/94, pulse 98, temperature 97.6 F (36.4 C), temperature source Oral, resp. rate 16, height 5\' 5"  (1.651 m), weight 208 lb (94.3 kg), SpO2 (!) 88 %.  Examination: General Appearance: The patient is well-developed, well-nourished, and in no distress. Skin: Gross inspection of skin unremarkable. Head: normocephalic, no gross deformities. Eyes: no gross deformities noted. ENT: ears appear grossly normal no exudates. Neck: Supple. No thyromegaly. No LAD. Respiratory: clear bilateraly. Cardiovascular: Normal S1 and S2 without murmur or rub. Extremities: No cyanosis. pulses are equal. Neurologic: Alert and oriented. No involuntary movements.  LABS: No results found for this or any previous visit (from the past 2160 hour(s)).  Radiology: Mm 3d Screen Breast Bilateral  Result Date: 08/12/2018 CLINICAL DATA:  Screening. EXAM: DIGITAL SCREENING BILATERAL MAMMOGRAM WITH TOMO AND CAD COMPARISON:  Previous exam(s). ACR Breast Density Category b: There are scattered areas of fibroglandular density. FINDINGS: There are no findings suspicious for malignancy. Images were processed with CAD. IMPRESSION: No mammographic evidence of malignancy. A result letter of this screening mammogram will be mailed directly to the patient. RECOMMENDATION: Screening mammogram in one year. (Code:SM-B-01Y) BI-RADS CATEGORY  1: Negative. Electronically Signed   By: Dorise Bullion III M.D   On: 08/12/2018 17:27    Mm 3d Screen Breast Bilateral  Result Date:  08/12/2018 CLINICAL DATA:  Screening. EXAM: DIGITAL SCREENING BILATERAL MAMMOGRAM WITH TOMO AND CAD COMPARISON:  Previous exam(s). ACR Breast Density Category b: There are scattered areas of fibroglandular density. FINDINGS: There are no findings suspicious for malignancy. Images were processed with CAD. IMPRESSION: No mammographic evidence of malignancy. A result letter of this screening mammogram will be mailed directly to the patient. RECOMMENDATION: Screening mammogram in one year. (Code:SM-B-01Y) BI-RADS CATEGORY  1: Negative. Electronically Signed   By: Dorise Bullion III M.D   On: 08/12/2018 17:27    Mr Cervical Spine Wo Contrast  Result Date: 07/20/2018 CLINICAL DATA:  Cervical radiculitis, status post fall 2 months ago EXAM: MRI CERVICAL SPINE WITHOUT CONTRAST TECHNIQUE: Multiplanar, multisequence MR imaging of the cervical spine was performed. No intravenous contrast was administered. COMPARISON:  None. FINDINGS: Patient motion degrades image quality limiting evaluation especially at C5-6. Alignment: Physiologic. Vertebrae: No fracture, evidence of discitis, or bone lesion. Cord: Normal signal and morphology. Posterior Fossa, vertebral arteries, paraspinal tissues: Posterior fossa  demonstrates no focal abnormality. Vertebral artery flow voids are maintained. Paraspinal soft tissues are unremarkable. Disc levels: Discs: Disc spaces are maintained. C2-3: No significant disc bulge. No neural foraminal stenosis. No central canal stenosis. C3-4: No significant disc bulge. No neural foraminal stenosis. No central canal stenosis. C4-5: No significant disc bulge. Possible mild bilateral foraminal narrowing. No central canal stenosis. C5-6: Limited evaluation secondary to patient motion. Broad-based disc bulge. Moderate-severe bilateral foraminal stenosis. No central canal stenosis. C6-7: Broad-based disc bulge. Mild bilateral foraminal narrowing. No central canal stenosis. C7-T1: No significant disc bulge. No  neural foraminal stenosis. No central canal stenosis. IMPRESSION: 1. Patient motion degrades image quality limiting evaluation especially at C5-6. 2. At C5-6 there is likely a broad-based disc bulge. Moderate-severe bilateral foraminal stenosis. Electronically Signed   By: Kathreen Devoid   On: 07/20/2018 12:43   Mm 3d Screen Breast Bilateral  Result Date: 08/12/2018 CLINICAL DATA:  Screening. EXAM: DIGITAL SCREENING BILATERAL MAMMOGRAM WITH TOMO AND CAD COMPARISON:  Previous exam(s). ACR Breast Density Category b: There are scattered areas of fibroglandular density. FINDINGS: There are no findings suspicious for malignancy. Images were processed with CAD. IMPRESSION: No mammographic evidence of malignancy. A result letter of this screening mammogram will be mailed directly to the patient. RECOMMENDATION: Screening mammogram in one year. (Code:SM-B-01Y) BI-RADS CATEGORY  1: Negative. Electronically Signed   By: Dorise Bullion III M.D   On: 08/12/2018 17:27      Assessment and Plan: Patient Active Problem List   Diagnosis Date Noted  . DM (diabetes mellitus), type 2 ([place]) 09/08/2016  . COPD exacerbation ([place]) 09/07/2016  . Neck pain 04/02/2016  . Abdominal pain 01/04/2014   1. Respiratory infection Resolving.  Patient just completed antibiotic.  2. Obstructive chronic bronchitis without exacerbation ([place]) Patient given injection of Depo-Medrol at today's visit.  Will get chest x-ray to rule out any new infection.  Given another 6-day taper of prednisone. - DG Chest 2 View; Future - predniSONE (DELTASONE) 10 MG tablet; Use per dose pack  Dispense: 21 tablet; Refill: 0 - Pulmonary Function Test; Future - methylPREDNISolone acetate (DEPO-MEDROL) injection 80 mg  3. Chronic respiratory failure with hypoxia (HCC) Continue to use oxygen as prescribed.  4. Oxygen dependent Patient currently using 2 L of oxygen via nasal cannula.  She will continue to use this at this time.  5. Cough Refill  patient's Tussionex at this visit.  Patient is instructed that if cough continues to linger and fever does not resolve that she is to return to clinic. - chlorpheniramine-HYDROcodone (TUSSIONEX PENNKINETIC ER) 10-8 MG/5ML SUER; Take 5 mLs by mouth every 12 (twelve) hours as needed for cough.  Dispense: 70 mL; Refill: 0 - methylPREDNISolone acetate (DEPO-MEDROL) injection 80 mg   General Counseling: I have discussed the findings of the evaluation and examination with Thayer Headings.  I have also discussed any further diagnostic evaluation thatmay be needed or ordered today. Vala verbalizes understanding of the findings of todays visit. We also reviewed her medications today and discussed drug interactions and side effects including but not limited excessive drowsiness and altered mental states. We also discussed that there is always a risk not just to her but also people around her. she has been encouraged to call the office with any questions or concerns that should arise related to todays visit.    Time spent: 25 This patient was seen by Orson Gear AGNP-C in Collaboration with Dr. Devona Konig as a part of collaborative care agreement.  I have personally obtained a history, examined the patient, evaluated laboratory and imaging results, formulated the assessment and plan and placed orders.    [name], MD [facility] Pulmonary and Critical Care Sleep medicine

## 2018-08-20 ENCOUNTER — Encounter: Payer: Self-pay | Admitting: Internal Medicine

## 2018-08-22 ENCOUNTER — Telehealth: Payer: Self-pay

## 2018-08-22 NOTE — Telephone Encounter (Signed)
Gave [employer] for oxygen set up and switch company orders, put copy in scan. Beth

## 2018-09-07 ENCOUNTER — Ambulatory Visit: Payer: Self-pay | Admitting: Internal Medicine

## 2018-09-26 ENCOUNTER — Ambulatory Visit: Payer: Self-pay | Admitting: Internal Medicine

## 2018-11-02 ENCOUNTER — Ambulatory Visit (INDEPENDENT_AMBULATORY_CARE_PROVIDER_SITE_OTHER): Payer: Medicare Other | Admitting: Internal Medicine

## 2018-11-02 ENCOUNTER — Other Ambulatory Visit: Payer: Self-pay

## 2018-11-02 DIAGNOSIS — R0602 Shortness of breath: Secondary | ICD-10-CM

## 2018-11-02 LAB — PULMONARY FUNCTION TEST

## 2018-11-06 NOTE — Procedures (Signed)
[facility] [place], [address]  DATE OF SERVICE: Nov 02, 2018  Complete Pulmonary Function Testing Interpretation:  FINDINGS:  Forced vital capacity is moderately decreased.  The FEV1 is 1.05 L which is 39% of predicted and is severely decreased.  Postbronchodilator no changes noted in the FEV1.  FEV1 FVC ratio is moderately decreased.  Total lung capacity is normal residual volume is increased residual volume total lung capacity ratio is increased total gas volume is increased.  DLCO is moderately decreased  IMPRESSION:  This pulmonary function study is consistent with severe obstructive lung disease does not appear to be any clinical improvement postbronchodilator clinical correlation recommended  [name], MD [facility] Pulmonary Critical Care Medicine Sleep Medicine

## 2018-11-07 ENCOUNTER — Encounter: Payer: Self-pay | Admitting: Adult Health

## 2018-11-07 ENCOUNTER — Other Ambulatory Visit: Payer: Self-pay

## 2018-11-07 ENCOUNTER — Ambulatory Visit (INDEPENDENT_AMBULATORY_CARE_PROVIDER_SITE_OTHER): Payer: Medicare Other | Admitting: Adult Health

## 2018-11-07 VITALS — Ht 65.0 in | Wt 200.0 lb

## 2018-11-07 DIAGNOSIS — Z9981 Dependence on supplemental oxygen: Secondary | ICD-10-CM

## 2018-11-07 DIAGNOSIS — J449 Chronic obstructive pulmonary disease, unspecified: Secondary | ICD-10-CM

## 2018-11-07 DIAGNOSIS — R05 Cough: Secondary | ICD-10-CM

## 2018-11-07 DIAGNOSIS — J988 Other specified respiratory disorders: Secondary | ICD-10-CM | POA: Diagnosis not present

## 2018-11-07 DIAGNOSIS — Z6832 Body mass index (BMI) 32.0-32.9, adult: Secondary | ICD-10-CM

## 2018-11-07 DIAGNOSIS — R059 Cough, unspecified: Secondary | ICD-10-CM

## 2018-11-07 MED ORDER — LEVOFLOXACIN 500 MG PO TABS: 500.0000 mg | ORAL_TABLET | Freq: Every day | ORAL | 0 refills | Status: DC

## 2018-11-07 MED ORDER — PREDNISONE 10 MG PO TABS: ORAL_TABLET | ORAL | 0 refills | Status: DC

## 2018-11-07 MED ORDER — HYDROCOD POLST-CPM POLST ER 10-8 MG/5ML PO SUER: 5.0000 mL | Freq: Two times a day (BID) | ORAL | 0 refills | Status: DC | PRN

## 2018-11-07 NOTE — Progress Notes (Signed)
[facility] [place], [place] [address]  Internal MEDICINE  Telephone Visit  Patient Name: Shelly [name]  [id number]  [id number]  Date of Service: 11/07/2018  I connected with the patient at 1202 by telephone and verified the patients identity using two identifiers.   I discussed the limitations, risks, security and privacy concerns of performing an evaluation and management service by telephone and the availability of in person appointments. I also discussed with the patient that there may be a patient responsible charge related to the service.  The patient expressed understanding and agrees to proceed.    Chief Complaint  Patient presents with  . Telephone Assessment  . Telephone Screen  . Follow-up    PFT,chest xray   . Cough  . Wheezing    HPI Pt here for follow up on CXR and PFT.  CXR shows some opacities that are consistent with her CT scan in 2018 for scarring.  Her PFT shows severe disease.  Pt Denies Chest pain, palpitations, headache, or blurred vision. She is reporting some cough and sob.  She also states she has been intermittently feeling bad and thinks its time for her to have prednisone.       Current Medication: Outpatient Encounter Medications as of 11/07/2018  Medication Sig  . chlorpheniramine-HYDROcodone (TUSSIONEX PENNKINETIC ER) 10-8 MG/5ML SUER Take 5 mLs by mouth every 12 (twelve) hours as needed for cough.  . fluticasone furoate-vilanterol (BREO ELLIPTA) 200-25 MCG/INH AEPB Inhale 1 puff into the lungs daily.  . furosemide (LASIX) 20 MG tablet Take 20 mg by mouth daily.  Marland Kitchen ipratropium-albuterol (DUONEB) 0.5-2.5 (3) MG/3ML SOLN Take 3 mLs by nebulization every 6 (six) hours as needed.  Marland Kitchen lisinopril (PRINIVIL,ZESTRIL) 10 MG tablet Take 20 mg by mouth daily.  Marland Kitchen LORazepam (ATIVAN) 0.5 MG tablet Take 0.5 mg by mouth every 8 (eight) hours as needed for anxiety.  Marland Kitchen oxyCODONE-acetaminophen (PERCOCET) 7.5-325 MG tablet Take 1 tablet by mouth  every 6 (six) hours as needed for severe pain.  . OXYGEN Inhale 3 L into the lungs.  . predniSONE (DELTASONE) 10 MG tablet Use per dose pack  . traMADol (ULTRAM) 50 MG tablet Take 50 mg by mouth every 6 (six) hours as needed.  . VENTOLIN HFA 108 (90 Base) MCG/ACT inhaler   . [DISCONTINUED] chlorpheniramine-HYDROcodone (TUSSIONEX PENNKINETIC ER) 10-8 MG/5ML SUER Take 5 mLs by mouth every 12 (twelve) hours as needed for cough.  . [DISCONTINUED] predniSONE (DELTASONE) 10 MG tablet Use per dose pack  . levofloxacin (LEVAQUIN) 500 MG tablet Take 1 tablet (500 mg total) by mouth daily.   No facility-administered encounter medications on file as of 11/07/2018.     Surgical History: Past Surgical History:  Procedure Laterality Date  . ABDOMINAL HYSTERECTOMY    . COLON SURGERY      Medical History: Past Medical History:  Diagnosis Date  . Asthma   . COPD (chronic obstructive pulmonary disease) ([place])   . Diabetes mellitus without complication ([place])   . Hypertension   . Sleep apnea     Family History: Family History  Adopted: Yes  Family history unknown: Yes    Social History   Socioeconomic History  . Marital status: Married    Spouse name: Not on file  . Number of children: Not on file  . Years of education: Not on file  . Highest education level: Not on file  Occupational History  . Not on file  Social Needs  . Emergency planning/management officer  strain: Not on file  . Food insecurity:    Worry: Not on file    Inability: Not on file  . Transportation needs:    Medical: Not on file    Non-medical: Not on file  Tobacco Use  . Smoking status: Former Research scientist (life sciences)  . Smokeless tobacco: Never Used  Substance and Sexual Activity  . Alcohol use: No  . Drug use: No  . Sexual activity: Not on file  Lifestyle  . Physical activity:    Days per week: Not on file    Minutes per session: Not on file  . Stress: Not on file  Relationships  . Social connections:    Talks on phone: Not on file     Gets together: Not on file    Attends religious service: Not on file    Active member of club or organization: Not on file    Attends meetings of clubs or organizations: Not on file    Relationship status: Not on file  . Intimate partner violence:    Fear of current or ex partner: Not on file    Emotionally abused: Not on file    Physically abused: Not on file    Forced sexual activity: Not on file  Other Topics Concern  . Not on file  Social History Narrative  . Not on file      Review of Systems  Constitutional: Negative for chills, fatigue and unexpected weight change.  HENT: Negative for congestion, rhinorrhea, sneezing and sore throat.   Eyes: Negative for photophobia, pain and redness.  Respiratory: Positive for cough and shortness of breath. Negative for chest tightness.   Cardiovascular: Negative for chest pain and palpitations.  Gastrointestinal: Negative for abdominal pain, constipation, diarrhea, nausea and vomiting.  Endocrine: Negative.   Genitourinary: Negative for dysuria and frequency.  Musculoskeletal: Negative for arthralgias, back pain, joint swelling and neck pain.  Skin: Negative for rash.  Allergic/Immunologic: Negative.   Neurological: Negative for tremors and numbness.  Hematological: Negative for adenopathy. Does not bruise/bleed easily.  Psychiatric/Behavioral: Negative for behavioral problems and sleep disturbance. The patient is not nervous/anxious.     Vital Signs: Ht 5\' 5"  (1.651 m)   Wt 200 lb (90.7 kg)   BMI 33.28 kg/m    Observation/Objective: Pt speaking in full sentences.  Conversing with ease.   Assessment/Plan: 1. Obstructive chronic bronchitis without exacerbation (HCC) Take prednisone as directed for cough and inflamation - predniSONE (DELTASONE) 10 MG tablet; Use per dose pack  Dispense: 21 tablet; Refill: 0  2. Respiratory infection Advised patient to take entire course of antibiotics as prescribed with food. Pt should return  to clinic in 7-10 days if symptoms fail to improve or new symptoms develop.  - levofloxacin (LEVAQUIN) 500 MG tablet; Take 1 tablet (500 mg total) by mouth daily.  Dispense: 7 tablet; Refill: 0  3. Oxygen dependent Continue 2lpm via [place] day night.   4. Cough Reviewed risks and possible side effects associated with taking opiates, benzodiazepines and other CNS depressants. Combination of these could cause dizziness and drowsiness. Advised patient not to drive or operate machinery when taking these medications, as patient's and other's life can be at risk and will have consequences. Patient verbalized understanding in this matter. Dependence and abuse for these drugs will be monitored closely. A Controlled substance policy and procedure is on file which allows [facility] to order a urine drug screen test at any visit. Patient understands and agrees with the plan -  chlorpheniramine-HYDROcodone (TUSSIONEX PENNKINETIC ER) 10-8 MG/5ML SUER; Take 5 mLs by mouth every 12 (twelve) hours as needed for cough.  Dispense: 70 mL; Refill: 0  5. Morbid obesity (HCC) Obesity Counseling: Risk Assessment: An assessment of behavioral risk factors was made today and includes lack of exercise sedentary lifestyle, lack of portion control and poor dietary habits.  Risk Modification Advice: She was counseled on portion control guidelines. Restricting daily caloric intake to. . The detrimental long term effects of obesity on her health and ongoing poor compliance was also discussed with the patient.    General Counseling: Shelly [name] understanding of the findings of today's phone visit and agrees with plan of treatment. I have discussed any further diagnostic evaluation that may be needed or ordered today. We also reviewed her medications today. she has been encouraged to call the office with any questions or concerns that should arise related to todays visit.    No orders of the defined types were  placed in this encounter.   Meds ordered this encounter  Medications  . predniSONE (DELTASONE) 10 MG tablet    Sig: Use per dose pack    Dispense:  21 tablet    Refill:  0  . levofloxacin (LEVAQUIN) 500 MG tablet    Sig: Take 1 tablet (500 mg total) by mouth daily.    Dispense:  7 tablet    Refill:  0  . chlorpheniramine-HYDROcodone (TUSSIONEX PENNKINETIC ER) 10-8 MG/5ML SUER    Sig: Take 5 mLs by mouth every 12 (twelve) hours as needed for cough.    Dispense:  70 mL    Refill:  0    Time spent: [place] AGNP-C Internal medicine

## 2018-12-07 ENCOUNTER — Other Ambulatory Visit: Payer: Self-pay | Admitting: Adult Health

## 2018-12-07 ENCOUNTER — Telehealth: Payer: Self-pay

## 2018-12-07 DIAGNOSIS — R059 Cough, unspecified: Secondary | ICD-10-CM

## 2018-12-07 DIAGNOSIS — R05 Cough: Secondary | ICD-10-CM

## 2018-12-07 MED ORDER — HYDROCOD POLST-CPM POLST ER 10-8 MG/5ML PO SUER: 5.0000 mL | Freq: Two times a day (BID) | ORAL | 0 refills | Status: DC | PRN

## 2018-12-07 NOTE — Telephone Encounter (Signed)
Informed pt that rx was sent and no refills, must be seen for further refills on this medication and be sure to keep next visit.

## 2018-12-07 NOTE — Progress Notes (Signed)
Refill for tussionex sent to pharmacy.  Pt was restarted on Levaquin and prednisone by cardiology.  NO MORE REFILLS without a visit, CXR etc.

## 2019-01-13 ENCOUNTER — Telehealth: Payer: Self-pay

## 2019-01-13 NOTE — Telephone Encounter (Signed)
CMN SIGNED AND PLACED IN [place] FOLDER. °

## 2019-02-04 ENCOUNTER — Emergency Department
Admission: EM | Admit: 2019-02-04 | Discharge: 2019-02-04 | Disposition: A | Discharge status: Home / Self Care | Payer: Medicare Other | Attending: Emergency Medicine | Admitting: Emergency Medicine

## 2019-02-04 ENCOUNTER — Other Ambulatory Visit: Payer: Self-pay

## 2019-02-04 ENCOUNTER — Emergency Department: Payer: Medicare Other

## 2019-02-04 ENCOUNTER — Encounter: Payer: Self-pay | Admitting: Emergency Medicine

## 2019-02-04 DIAGNOSIS — R51 Headache: Secondary | ICD-10-CM | POA: Diagnosis present

## 2019-02-04 DIAGNOSIS — J9611 Chronic respiratory failure with hypoxia: Secondary | ICD-10-CM

## 2019-02-04 DIAGNOSIS — E119 Type 2 diabetes mellitus without complications: Secondary | ICD-10-CM | POA: Diagnosis not present

## 2019-02-04 DIAGNOSIS — G43909 Migraine, unspecified, not intractable, without status migrainosus: Secondary | ICD-10-CM | POA: Insufficient documentation

## 2019-02-04 DIAGNOSIS — I10 Essential (primary) hypertension: Secondary | ICD-10-CM | POA: Diagnosis not present

## 2019-02-04 DIAGNOSIS — J449 Chronic obstructive pulmonary disease, unspecified: Secondary | ICD-10-CM | POA: Diagnosis not present

## 2019-02-04 DIAGNOSIS — Z87891 Personal history of nicotine dependence: Secondary | ICD-10-CM | POA: Insufficient documentation

## 2019-02-04 DIAGNOSIS — J45909 Unspecified asthma, uncomplicated: Secondary | ICD-10-CM | POA: Diagnosis not present

## 2019-02-04 LAB — CBC WITH DIFFERENTIAL/PLATELET
Abs Immature Granulocytes: 0.03 10*3/uL (ref 0.00–0.07)
Basophils Absolute: 0 10*3/uL (ref 0.0–0.1)
Basophils Relative: 1 %
Eosinophils Absolute: 0.1 10*3/uL (ref 0.0–0.5)
Eosinophils Relative: 2 %
HCT: 53.7 % — ABNORMAL HIGH (ref 36.0–46.0)
Hemoglobin: 16.2 g/dL — ABNORMAL HIGH (ref 12.0–15.0)
Immature Granulocytes: 0 %
Lymphocytes Relative: 24 %
Lymphs Abs: 2 10*3/uL (ref 0.7–4.0)
MCH: 29.4 pg (ref 26.0–34.0)
MCHC: 30.2 g/dL (ref 30.0–36.0)
MCV: 97.5 fL (ref 80.0–100.0)
Monocytes Absolute: 0.5 10*3/uL (ref 0.1–1.0)
Monocytes Relative: 7 %
Neutro Abs: 5.5 10*3/uL (ref 1.7–7.7)
Neutrophils Relative %: 66 %
Platelets: 207 10*3/uL (ref 150–400)
RBC: 5.51 MIL/uL — ABNORMAL HIGH (ref 3.87–5.11)
RDW: 13.7 % (ref 11.5–15.5)
WBC: 8.3 10*3/uL (ref 4.0–10.5)
nRBC: 0 % (ref 0.0–0.2)

## 2019-02-04 LAB — BASIC METABOLIC PANEL
Anion gap: 7 (ref 5–15)
BUN: 13 mg/dL (ref 6–20)
CO2: 36 mmol/L — ABNORMAL HIGH (ref 22–32)
Calcium: 8.9 mg/dL (ref 8.9–10.3)
Chloride: 95 mmol/L — ABNORMAL LOW (ref 98–111)
Creatinine, Ser: 0.74 mg/dL (ref 0.44–1.00)
GFR calc Af Amer: >60 mL/min (ref >60)
GFR calc non Af Amer: >60 mL/min (ref >60)
Glucose, Bld: 94 mg/dL (ref 70–99)
Potassium: 4.6 mmol/L (ref 3.5–5.1)
Sodium: 138 mmol/L (ref 135–145)

## 2019-02-04 LAB — BRAIN NATRIURETIC PEPTIDE: B Natriuretic Peptide: 156 pg/mL — ABNORMAL HIGH (ref 0.0–100.0)

## 2019-02-04 LAB — TROPONIN I (HIGH SENSITIVITY): Troponin I (High Sensitivity): 7 ng/L (ref <18)

## 2019-02-04 MED ORDER — PROCHLORPERAZINE EDISYLATE 10 MG/2ML IJ SOLN
10.0000 mg | Freq: Once | INTRAMUSCULAR | Status: AC
  Administered 2019-02-04: 15:00:00 10 mg via INTRAVENOUS
  Filled 2019-02-04: qty 2

## 2019-02-04 MED ORDER — KETOROLAC TROMETHAMINE 30 MG/ML IJ SOLN
15.0000 mg | Freq: Once | INTRAMUSCULAR | Status: AC
  Administered 2019-02-04: 16:00:00 15 mg via INTRAVENOUS
  Filled 2019-02-04: qty 1

## 2019-02-04 MED ORDER — DIPHENHYDRAMINE HCL 50 MG/ML IJ SOLN
25.0000 mg | Freq: Once | INTRAMUSCULAR | Status: AC
  Administered 2019-02-04: 15:00:00 25 mg via INTRAVENOUS
  Filled 2019-02-04: qty 1

## 2019-02-04 NOTE — ED Triage Notes (Signed)
Pt to ED via POV c/o Migraine. Pt has hx/o same but is not on medications for them. Pt states that she has had a migraine for the past 3 days. Pt has taken OTC medication without relief. Pt states that she is under a lot of stress because her mother is currently in Hospice and is dying.  Pts initial SpO2 sats were 74-78% on room air. Charge RN was called for a bed. Room was given. Pt informed this RN that she is normally on O2 at home but did not bring it. Pt states that she wears 3 liters of nasal canula all the time. Pt was placed on 3 liters via [place].

## 2019-02-04 NOTE — ED Provider Notes (Signed)
[facility] Emergency Department Provider Note   ____________________________________________   First MD Initiated Contact with Patient 02/04/19 1358     (approximate)  I have reviewed the triage vital signs and the nursing notes.   HISTORY  Chief Complaint Migraine    HPI Shelly [name] is a 58 y.o. female with past medical history of hypertension, diabetes, and COPD on 3 L nasal cannula presents to the ED complaining of headache.  Patient reports she has been under a lot of stress lately with her mother currently dying under hospice care.  Over the past 3 days she has had a gradually worsening headache that affects both sides of her head and her occiput, which she describes as a throbbing.  Headache is exacerbated by bright lights and is associated with nausea.  She describes symptoms as similar to prior migraine headaches, has not taken anything for this prior to arrival.  She denies any vision changes, speech changes, numbness, or weakness.  She does state that she uses 3 L nasal cannula at home for her COPD, but did not bring this with her when she came to the hospital.  She states she has been coughing with some shortness of breath, but that they are not significantly worse than usual.  She denies any fevers, chills, or chest pain.        Past Medical History:  Diagnosis Date  . Asthma   . COPD (chronic obstructive pulmonary disease) ([place])   . Diabetes mellitus without complication ([place])   . Hypertension   . Sleep apnea     Patient Active Problem List   Diagnosis Date Noted  . DM (diabetes mellitus), type 2 ([place]) 09/08/2016  . COPD exacerbation ([place]) 09/07/2016  . Neck pain 04/02/2016  . Abdominal pain 01/04/2014    Past Surgical History:  Procedure Laterality Date  . ABDOMINAL HYSTERECTOMY    . COLON SURGERY      Prior to Admission medications   Medication Sig Start Date End Date Taking? Authorizing Provider   chlorpheniramine-HYDROcodone (TUSSIONEX PENNKINETIC ER) 10-8 MG/5ML SUER Take 5 mLs by mouth every 12 (twelve) hours as needed for cough. 12/07/18   Scarboro, Audie Clear, NP  fluticasone furoate-vilanterol (BREO ELLIPTA) 200-25 MCG/INH AEPB Inhale 1 puff into the lungs daily. 09/09/16   Fritzi Mandes, MD  furosemide (LASIX) 20 MG tablet Take 20 mg by mouth daily.    [provider]  ipratropium-albuterol (DUONEB) 0.5-2.5 (3) MG/3ML SOLN Take 3 mLs by nebulization every 6 (six) hours as needed. 09/09/16   Fritzi Mandes, MD  levofloxacin (LEVAQUIN) 500 MG tablet Take 1 tablet (500 mg total) by mouth daily. Patient not taking: Reported on 02/04/2019 11/07/18   Kendell Bane, NP  lisinopril (PRINIVIL,ZESTRIL) 10 MG tablet Take 20 mg by mouth daily.    [provider]  LORazepam (ATIVAN) 0.5 MG tablet Take 0.5 mg by mouth every 8 (eight) hours as needed for anxiety.    [provider]  predniSONE (DELTASONE) 10 MG tablet Use per dose pack 11/07/18   Scarboro, Audie Clear, NP  traMADol (ULTRAM) 50 MG tablet Take 50 mg by mouth every 6 (six) hours as needed.    [provider]  VENTOLIN HFA 108 [phone] Base) MCG/ACT inhaler  08/11/17   [provider]    Allergies Patient has no known allergies.  Family History  Adopted: Yes  Family history unknown: Yes    Social History Social History   Tobacco Use  .  Smoking status: Former Research scientist (life sciences)  . Smokeless tobacco: Never Used  Substance Use Topics  . Alcohol use: No  . Drug use: No    Review of Systems  Constitutional: No fever/chills Eyes: No visual changes. ENT: No sore throat. Cardiovascular: Denies chest pain. Respiratory: Denies shortness of breath. Gastrointestinal: No abdominal pain.  No nausea, no vomiting.  No diarrhea.  No constipation. Genitourinary: Negative for dysuria. Musculoskeletal: Negative for back pain. Skin: Negative for rash. Neurological: Positive for headache, negative for focal weakness or numbness.   ____________________________________________   PHYSICAL EXAM:  VITAL SIGNS: ED Triage Vitals  Enc Vitals Group     BP --      Pulse Rate 02/04/19 1348 (!) 105     Resp 02/04/19 1356 18     Temp 02/04/19 1348 98.4 F (36.9 C)     Temp Source 02/04/19 1348 Oral     SpO2 02/04/19 1356 (!) 88 %     Weight --      Height --      Head Circumference --      Peak Flow --      Pain Score 02/04/19 1356 10     Pain Loc --      Pain Edu? --      Excl. in [place]? --     Constitutional: Alert and oriented. Eyes: Conjunctivae are normal.  Pupils equal round and reactive to light bilaterally, extraocular movements intact bilaterally. Head: Atraumatic. Nose: No congestion/rhinnorhea. Mouth/Throat: Mucous membranes are moist. Neck: Normal ROM Cardiovascular: Normal rate, regular rhythm. Grossly normal heart sounds. Respiratory: Normal respiratory effort.  No retractions. Lungs CTAB. Gastrointestinal: Soft and nontender. No distention. Genitourinary: deferred Musculoskeletal: No lower extremity tenderness nor edema. Neurologic:  Normal speech and language. No gross focal neurologic deficits are appreciated.  5 out of 5 strength in bilateral upper and lower extremities, no pronator drift.  Cranial nerves II through XII grossly intact. Skin:  Skin is warm, dry and intact. No rash noted. Psychiatric: Mood and affect are normal. Speech and behavior are normal.  ____________________________________________   LABS (all labs ordered are listed, but only abnormal results are displayed)  Labs Reviewed  CBC WITH DIFFERENTIAL/PLATELET - Abnormal; Notable for the following components:      Result Value   RBC 5.51 (*)    Hemoglobin 16.2 (*)    HCT 53.7 (*)    All other components within normal limits  BASIC METABOLIC PANEL - Abnormal; Notable for the following components:   Chloride 95 (*)    CO2 36 (*)    All other components within normal limits  BRAIN NATRIURETIC PEPTIDE - Abnormal; Notable  for the following components:   B Natriuretic Peptide 156.0 (*)    All other components within normal limits  TROPONIN I (HIGH SENSITIVITY)  TROPONIN I (HIGH SENSITIVITY)   ____________________________________________  EKG  ED ECG REPORT I, Blake Divine, the attending physician, personally viewed and interpreted this ECG.   Date: 02/04/2019  EKG Time: 15:39  Rate: 70  Rhythm: normal sinus rhythm  Axis: Normal  Intervals:none  ST&T Change: None    PROCEDURES  Procedure(s) performed (including Critical Care):  Procedures   ____________________________________________   INITIAL IMPRESSION / ASSESSMENT AND PLAN / ED COURSE       58 year old female with history of COPD on 3 L nasal cannula chronically presents to the ED with 3 days of gradually worsening headache.  She has a benign and nonfocal neurologic exam, given gradual onset doubt SAH.  She has no neck stiffness or fevers, doubt meningitis.  Appears most consistent with migraine headache, will treat with migraine cocktail.  Patient was noted to be hypoxic in triage, however O2 sats are within normal limits, greater than 92%, once patient placed on her usual oxygen.  Will screen EKG, chest x-ray, labs, but there is not appear to be a significant COPD exacerbation at this time.  Chest x-ray negative for acute process.  EKG without acute ischemic changes and troponin within normal limits.  Remainder of labs unremarkable.  Patient continues to maintain sats appropriately on her typical 3 L nasal cannula, symptoms do not suggest acute COPD exacerbation.  Patient reports improvement in headache at this time, likely migraine headache versus tension headache.  Counseled patient to follow-up with her PCP and otherwise return to the ED for new or worsening symptoms, patient agrees with plan.      ____________________________________________   FINAL CLINICAL IMPRESSION(S) / ED DIAGNOSES  Final diagnoses:  Migraine without  status migrainosus, not intractable, unspecified migraine type  Chronic obstructive pulmonary disease, unspecified COPD type ([place])  Chronic respiratory failure with hypoxia [facility])     ED Discharge Orders    None       Note:  This document was prepared using Dragon voice recognition software and may include unintentional dictation errors.   Blake Divine, MD 02/04/19 2150

## 2019-02-04 NOTE — ED Notes (Signed)
Took w/c in for pt - she had already left

## 2019-02-04 NOTE — ED Notes (Signed)
Iv attempted x4 by nurse and students with no success. Dr made aware. Pt in xr

## 2019-02-16 ENCOUNTER — Ambulatory Visit: Payer: Medicare Other | Admitting: Internal Medicine

## 2019-02-27 ENCOUNTER — Emergency Department: Payer: Medicare Other

## 2019-02-27 ENCOUNTER — Other Ambulatory Visit: Payer: Self-pay

## 2019-02-27 ENCOUNTER — Inpatient Hospital Stay
Admission: EM | Admit: 2019-02-27 | Discharge: 2019-03-06 | DRG: 917 | Disposition: A | Discharge status: Skilled Nursing Facility | Payer: Medicare Other | Attending: Internal Medicine | Admitting: Internal Medicine

## 2019-02-27 ENCOUNTER — Encounter: Payer: Self-pay | Admitting: Emergency Medicine

## 2019-02-27 DIAGNOSIS — I110 Hypertensive heart disease with heart failure: Secondary | ICD-10-CM | POA: Diagnosis present

## 2019-02-27 DIAGNOSIS — Z23 Encounter for immunization: Secondary | ICD-10-CM

## 2019-02-27 DIAGNOSIS — E119 Type 2 diabetes mellitus without complications: Secondary | ICD-10-CM | POA: Diagnosis present

## 2019-02-27 DIAGNOSIS — Z9981 Dependence on supplemental oxygen: Secondary | ICD-10-CM | POA: Diagnosis not present

## 2019-02-27 DIAGNOSIS — J9621 Acute and chronic respiratory failure with hypoxia: Secondary | ICD-10-CM | POA: Diagnosis present

## 2019-02-27 DIAGNOSIS — J9622 Acute and chronic respiratory failure with hypercapnia: Secondary | ICD-10-CM | POA: Diagnosis present

## 2019-02-27 DIAGNOSIS — E872 Acidosis: Secondary | ICD-10-CM | POA: Diagnosis present

## 2019-02-27 DIAGNOSIS — Z7952 Long term (current) use of systemic steroids: Secondary | ICD-10-CM

## 2019-02-27 DIAGNOSIS — Z79891 Long term (current) use of opiate analgesic: Secondary | ICD-10-CM | POA: Diagnosis not present

## 2019-02-27 DIAGNOSIS — F411 Generalized anxiety disorder: Secondary | ICD-10-CM | POA: Diagnosis present

## 2019-02-27 DIAGNOSIS — G4733 Obstructive sleep apnea (adult) (pediatric): Secondary | ICD-10-CM | POA: Diagnosis present

## 2019-02-27 DIAGNOSIS — Z79890 Hormone replacement therapy (postmenopausal): Secondary | ICD-10-CM

## 2019-02-27 DIAGNOSIS — G9341 Metabolic encephalopathy: Secondary | ICD-10-CM | POA: Diagnosis not present

## 2019-02-27 DIAGNOSIS — H1089 Other conjunctivitis: Secondary | ICD-10-CM | POA: Diagnosis present

## 2019-02-27 DIAGNOSIS — I5033 Acute on chronic diastolic (congestive) heart failure: Secondary | ICD-10-CM | POA: Diagnosis present

## 2019-02-27 DIAGNOSIS — Z90710 Acquired absence of both cervix and uterus: Secondary | ICD-10-CM

## 2019-02-27 DIAGNOSIS — Z87891 Personal history of nicotine dependence: Secondary | ICD-10-CM | POA: Diagnosis not present

## 2019-02-27 DIAGNOSIS — M79605 Pain in left leg: Secondary | ICD-10-CM

## 2019-02-27 DIAGNOSIS — T50902A Poisoning by unspecified drugs, medicaments and biological substances, intentional self-harm, initial encounter: Secondary | ICD-10-CM | POA: Diagnosis present

## 2019-02-27 DIAGNOSIS — Z20828 Contact with and (suspected) exposure to other viral communicable diseases: Secondary | ICD-10-CM | POA: Diagnosis present

## 2019-02-27 DIAGNOSIS — Z79899 Other long term (current) drug therapy: Secondary | ICD-10-CM

## 2019-02-27 DIAGNOSIS — J441 Chronic obstructive pulmonary disease with (acute) exacerbation: Secondary | ICD-10-CM | POA: Diagnosis present

## 2019-02-27 HISTORY — DX: Heart failure, unspecified: I50.9

## 2019-02-27 LAB — CBC WITH DIFFERENTIAL/PLATELET
Abs Immature Granulocytes: 0.04 10*3/uL (ref 0.00–0.07)
Basophils Absolute: 0 10*3/uL (ref 0.0–0.1)
Basophils Relative: 0 %
Eosinophils Absolute: 0 10*3/uL (ref 0.0–0.5)
Eosinophils Relative: 0 %
HCT: 53.2 % — ABNORMAL HIGH (ref 36.0–46.0)
Hemoglobin: 16.4 g/dL — ABNORMAL HIGH (ref 12.0–15.0)
Immature Granulocytes: 0 %
Lymphocytes Relative: 18 %
Lymphs Abs: 1.8 10*3/uL (ref 0.7–4.0)
MCH: 29.4 pg (ref 26.0–34.0)
MCHC: 30.8 g/dL (ref 30.0–36.0)
MCV: 95.3 fL (ref 80.0–100.0)
Monocytes Absolute: 0.7 10*3/uL (ref 0.1–1.0)
Monocytes Relative: 7 %
Neutro Abs: 7.3 10*3/uL (ref 1.7–7.7)
Neutrophils Relative %: 75 %
Platelets: 216 10*3/uL (ref 150–400)
RBC: 5.58 MIL/uL — ABNORMAL HIGH (ref 3.87–5.11)
RDW: 15.1 % (ref 11.5–15.5)
WBC: 9.9 10*3/uL (ref 4.0–10.5)
nRBC: 0 % (ref 0.0–0.2)

## 2019-02-27 LAB — COMPREHENSIVE METABOLIC PANEL
ALT: 48 U/L — ABNORMAL HIGH (ref 0–44)
AST: 27 U/L (ref 15–41)
Albumin: 3.6 g/dL (ref 3.5–5.0)
Alkaline Phosphatase: 48 U/L (ref 38–126)
Anion gap: 10 (ref 5–15)
BUN: 28 mg/dL — ABNORMAL HIGH (ref 6–20)
CO2: 37 mmol/L — ABNORMAL HIGH (ref 22–32)
Calcium: 8.9 mg/dL (ref 8.9–10.3)
Chloride: 89 mmol/L — ABNORMAL LOW (ref 98–111)
Creatinine, Ser: 0.86 mg/dL (ref 0.44–1.00)
GFR calc Af Amer: >60 mL/min (ref >60)
GFR calc non Af Amer: >60 mL/min (ref >60)
Glucose, Bld: 110 mg/dL — ABNORMAL HIGH (ref 70–99)
Potassium: 4.7 mmol/L (ref 3.5–5.1)
Sodium: 136 mmol/L (ref 135–145)
Total Bilirubin: 0.8 mg/dL (ref 0.3–1.2)
Total Protein: 6.3 g/dL — ABNORMAL LOW (ref 6.5–8.1)

## 2019-02-27 LAB — URINE DRUG SCREEN, QUALITATIVE (ARMC ONLY)
Amphetamines, Ur Screen: NOT DETECTED
Barbiturates, Ur Screen: NOT DETECTED
Benzodiazepine, Ur Scrn: NOT DETECTED
Cannabinoid 50 Ng, Ur ~~LOC~~: POSITIVE — AB
Cocaine Metabolite,Ur ~~LOC~~: NOT DETECTED
MDMA (Ecstasy)Ur Screen: NOT DETECTED
Methadone Scn, Ur: NOT DETECTED
Opiate, Ur Screen: NOT DETECTED
Phencyclidine (PCP) Ur S: NOT DETECTED
Tricyclic, Ur Screen: NOT DETECTED

## 2019-02-27 LAB — BLOOD GAS, VENOUS
Acid-Base Excess: 15 mmol/L — ABNORMAL HIGH (ref 0.0–2.0)
Bicarbonate: 45.8 mmol/L — ABNORMAL HIGH (ref 20.0–28.0)
O2 Saturation: 86.4 %
Patient temperature: 37
pCO2, Ven: 91 mmHg (ref 44.0–60.0)
pH, Ven: 7.31 (ref 7.250–7.430)
pO2, Ven: 57 mmHg — ABNORMAL HIGH (ref 32.0–45.0)

## 2019-02-27 LAB — ETHANOL: Alcohol, Ethyl (B): <10 mg/dL (ref <10)

## 2019-02-27 LAB — BLOOD GAS, ARTERIAL
Acid-Base Excess: 9.2 mmol/L — ABNORMAL HIGH (ref 0.0–2.0)
Bicarbonate: 37.3 mmol/L — ABNORMAL HIGH (ref 20.0–28.0)
FIO2: 0.5
MECHVT: 500 mL
O2 Saturation: 92.5 %
PEEP: 5 cmH2O
Patient temperature: 37
RATE: 22 resp/min
pCO2 arterial: 66 mmHg (ref 32.0–48.0)
pH, Arterial: 7.36 (ref 7.350–7.450)
pO2, Arterial: 68 mmHg — ABNORMAL LOW (ref 83.0–108.0)

## 2019-02-27 LAB — GLUCOSE, CAPILLARY
Glucose-Capillary: 143 mg/dL — ABNORMAL HIGH (ref 70–99)
Glucose-Capillary: 135 mg/dL — ABNORMAL HIGH (ref 70–99)

## 2019-02-27 LAB — PROCALCITONIN: Procalcitonin: <0.1 ng/mL

## 2019-02-27 LAB — TRIGLYCERIDES: Triglycerides: 196 mg/dL — ABNORMAL HIGH (ref <150)

## 2019-02-27 LAB — MAGNESIUM: Magnesium: 2.3 mg/dL (ref 1.7–2.4)

## 2019-02-27 LAB — MRSA PCR SCREENING: MRSA by PCR: NEGATIVE

## 2019-02-27 LAB — SARS CORONAVIRUS 2 BY RT PCR (HOSPITAL ORDER, PERFORMED IN ~~LOC~~ HOSPITAL LAB): SARS Coronavirus 2: NEGATIVE

## 2019-02-27 LAB — BRAIN NATRIURETIC PEPTIDE: B Natriuretic Peptide: 1928 pg/mL — ABNORMAL HIGH (ref 0.0–100.0)

## 2019-02-27 MED ORDER — IPRATROPIUM-ALBUTEROL 0.5-2.5 (3) MG/3ML IN SOLN
3.0000 mL | Freq: Once | RESPIRATORY_TRACT | Status: AC
  Administered 2019-02-27: 15:00:00 3 mL via RESPIRATORY_TRACT
  Filled 2019-02-27: qty 3

## 2019-02-27 MED ORDER — ACETAMINOPHEN 325 MG PO TABS
650.0000 mg | ORAL_TABLET | Freq: Four times a day (QID) | ORAL | Status: DC | PRN
  Administered 2019-02-28 – 2019-03-06 (×10): 650 mg via ORAL
  Filled 2019-02-27 (×11): qty 2

## 2019-02-27 MED ORDER — SODIUM CHLORIDE 0.9 % IV SOLN
INTRAVENOUS | Status: AC | PRN
  Administered 2019-02-27: 1000 mL via INTRAVENOUS

## 2019-02-27 MED ORDER — ETOMIDATE 2 MG/ML IV SOLN
INTRAVENOUS | Status: AC | PRN
  Administered 2019-02-27: 27.21 mg via INTRAVENOUS

## 2019-02-27 MED ORDER — ALBUTEROL (5 MG/ML) CONTINUOUS INHALATION SOLN
5.0000 mg/h | INHALATION_SOLUTION | Freq: Once | RESPIRATORY_TRACT | Status: DC
  Filled 2019-02-27: qty 20

## 2019-02-27 MED ORDER — ONDANSETRON HCL 4 MG/2ML IJ SOLN
4.0000 mg | Freq: Four times a day (QID) | INTRAMUSCULAR | Status: DC | PRN
  Administered 2019-03-02: 4 mg via INTRAVENOUS
  Filled 2019-02-27: qty 2

## 2019-02-27 MED ORDER — SUCCINYLCHOLINE CHLORIDE 20 MG/ML IJ SOLN
INTRAMUSCULAR | Status: AC | PRN
  Administered 2019-02-27: 136.05 mg via INTRAVENOUS

## 2019-02-27 MED ORDER — PROPOFOL 1000 MG/100ML IV EMUL
5.0000 ug/kg/min | INTRAVENOUS | Status: DC
  Administered 2019-02-27: 17:00:00 5 ug/kg/min via INTRAVENOUS
  Administered 2019-02-28: 20 ug/kg/min via INTRAVENOUS
  Filled 2019-02-27 (×3): qty 100

## 2019-02-27 MED ORDER — CHLORHEXIDINE GLUCONATE 0.12% ORAL RINSE (MEDLINE KIT)
15.0000 mL | Freq: Two times a day (BID) | OROMUCOSAL | Status: DC
  Administered 2019-02-27 – 2019-02-28 (×2): 15 mL via OROMUCOSAL

## 2019-02-27 MED ORDER — DULOXETINE HCL 30 MG PO CPEP
30.0000 mg | ORAL_CAPSULE | Freq: Every day | ORAL | Status: DC
  Administered 2019-03-01 – 2019-03-02 (×2): 30 mg via ORAL
  Filled 2019-02-27 (×2): qty 1

## 2019-02-27 MED ORDER — ONDANSETRON HCL 4 MG PO TABS: 4.0000 mg | ORAL_TABLET | Freq: Four times a day (QID) | ORAL | Status: DC | PRN

## 2019-02-27 MED ORDER — ETOMIDATE 2 MG/ML IV SOLN
20.0000 mg | Freq: Once | INTRAVENOUS | Status: AC
  Administered 2019-02-27: 16:00:00 20 mg via INTRAVENOUS
  Filled 2019-02-27: qty 10

## 2019-02-27 MED ORDER — POLYMYXIN B-TRIMETHOPRIM 10000-0.1 UNIT/ML-% OP SOLN
1.0000 [drp] | OPHTHALMIC | Status: DC
  Administered 2019-02-27 – 2019-03-06 (×36): 1 [drp] via OPHTHALMIC
  Filled 2019-02-27: qty 10

## 2019-02-27 MED ORDER — LISINOPRIL 20 MG PO TABS: 20.0000 mg | ORAL_TABLET | Freq: Every day | ORAL | Status: DC

## 2019-02-27 MED ORDER — SODIUM CHLORIDE 0.9 % IV SOLN
1.5000 g | Freq: Two times a day (BID) | INTRAVENOUS | Status: DC
  Administered 2019-02-27 – 2019-02-28 (×2): 1.5 g via INTRAVENOUS
  Filled 2019-02-27: qty 4
  Filled 2019-02-27 (×2): qty 1.5

## 2019-02-27 MED ORDER — ACETAMINOPHEN 650 MG RE SUPP: 650.0000 mg | Freq: Four times a day (QID) | RECTAL | Status: DC | PRN

## 2019-02-27 MED ORDER — POLYETHYLENE GLYCOL 3350 17 G PO PACK
17.0000 g | PACK | Freq: Every day | ORAL | Status: DC | PRN
  Administered 2019-03-05 – 2019-03-06 (×2): 17 g via ORAL
  Filled 2019-02-27 (×3): qty 1

## 2019-02-27 MED ORDER — ENOXAPARIN SODIUM 40 MG/0.4ML ~~LOC~~ SOLN
40.0000 mg | SUBCUTANEOUS | Status: DC
  Administered 2019-02-27: 19:00:00 40 mg via SUBCUTANEOUS

## 2019-02-27 MED ORDER — FUROSEMIDE 10 MG/ML IJ SOLN
40.0000 mg | Freq: Once | INTRAMUSCULAR | Status: AC
  Administered 2019-02-27: 40 mg via INTRAVENOUS
  Filled 2019-02-27: qty 4

## 2019-02-27 MED ORDER — SUCCINYLCHOLINE CHLORIDE 20 MG/ML IJ SOLN
130.0000 mg | Freq: Once | INTRAMUSCULAR | Status: AC
  Administered 2019-02-27: 16:00:00 130 mg via INTRAVENOUS
  Filled 2019-02-27: qty 1

## 2019-02-27 MED ORDER — TOBRAMYCIN-DEXAMETHASONE 0.3-0.1 % OP OINT
TOPICAL_OINTMENT | Freq: Two times a day (BID) | OPHTHALMIC | Status: AC
  Administered 2019-02-27 – 2019-02-28 (×2): via OPHTHALMIC
  Administered 2019-02-28: 1 via OPHTHALMIC
  Administered 2019-03-01 – 2019-03-02 (×3): via OPHTHALMIC
  Filled 2019-02-27 (×2): qty 3.5

## 2019-02-27 MED ORDER — CHLORHEXIDINE GLUCONATE CLOTH 2 % EX PADS
6.0000 | MEDICATED_PAD | Freq: Every day | CUTANEOUS | Status: DC
  Administered 2019-02-27 – 2019-03-01 (×2): 6 via TOPICAL

## 2019-02-27 MED ORDER — FUROSEMIDE 10 MG/ML IJ SOLN
20.0000 mg | Freq: Once | INTRAMUSCULAR | Status: AC
  Administered 2019-02-27: 19:00:00 20 mg via INTRAVENOUS

## 2019-02-27 MED ORDER — FENTANYL 2500MCG IN NS 250ML (10MCG/ML) PREMIX INFUSION
0.0000 ug/h | INTRAVENOUS | Status: DC
  Administered 2019-02-27: 17:00:00 50 ug/h via INTRAVENOUS
  Filled 2019-02-27: qty 250

## 2019-02-27 MED ORDER — FLUTICASONE FUROATE-VILANTEROL 200-25 MCG/INH IN AEPB
1.0000 | INHALATION_SPRAY | Freq: Every day | RESPIRATORY_TRACT | Status: DC
  Filled 2019-02-27: qty 28

## 2019-02-27 MED ORDER — METHYLPREDNISOLONE SODIUM SUCC 125 MG IJ SOLR
60.0000 mg | Freq: Four times a day (QID) | INTRAMUSCULAR | Status: DC
  Administered 2019-02-28 (×3): 60 mg via INTRAVENOUS
  Filled 2019-02-27 (×2): qty 2

## 2019-02-27 MED ORDER — ORAL CARE MOUTH RINSE
15.0000 mL | OROMUCOSAL | Status: DC
  Administered 2019-02-27 – 2019-02-28 (×6): 15 mL via OROMUCOSAL

## 2019-02-27 MED ORDER — IPRATROPIUM-ALBUTEROL 0.5-2.5 (3) MG/3ML IN SOLN
3.0000 mL | Freq: Four times a day (QID) | RESPIRATORY_TRACT | Status: DC
  Administered 2019-02-27 – 2019-02-28 (×3): 3 mL via RESPIRATORY_TRACT
  Filled 2019-02-27 (×3): qty 3

## 2019-02-27 MED ORDER — METHYLPREDNISOLONE SODIUM SUCC 125 MG IJ SOLR
125.0000 mg | Freq: Once | INTRAMUSCULAR | Status: AC
  Administered 2019-02-27: 125 mg via INTRAVENOUS
  Filled 2019-02-27: qty 2

## 2019-02-27 NOTE — ED Notes (Signed)
Respiratory called to administer nebs, waiting pending SARS results.

## 2019-02-27 NOTE — Consult Note (Signed)
CRITICAL CARE NOTE      CHIEF COMPLAINT:   Acute on chronic hypoxemic respiratory failure   HISTORY OF PRESENT ILLNESS   This is a pleasant female with hx of asthma, CHF, advanced COPD on home O2 currently at 2-3L/min [place], DM, OSA, essential HTN who came in due to lethargy and hypoxemia. Patient was seen in ED and found to be lethargic with hypoxemia and intubated for airway protection. HPI data obtained from EMR due to sedation and MV on admission.  Patient was signed out to hospitalist service for possible acute exacerbation of COPD. She recently had severe emotional distress due to loss of mother.  Labs are notable for mild acidemia on VBG with absence of leukocytosis with secondary erythrocytosis on CBC, bmp with normal renal function and mild ALT elevation and CXR with possible RLL infiltrate. UDS is pending.  ETOH was normal. COVID negative.   PAST MEDICAL HISTORY   Past Medical History:  Diagnosis Date  . Asthma   . CHF (congestive heart failure) ([place])   . COPD (chronic obstructive pulmonary disease) ([place])   . Diabetes mellitus without complication ([place])   . Hypertension   . Sleep apnea      SURGICAL HISTORY   Past Surgical History:  Procedure Laterality Date  . ABDOMINAL HYSTERECTOMY    . COLON SURGERY       FAMILY HISTORY   Family History  Adopted: Yes  Family history unknown: Yes     SOCIAL HISTORY   Social History   Tobacco Use  . Smoking status: Former Research scientist (life sciences)  . Smokeless tobacco: Never Used  Substance Use Topics  . Alcohol use: No  . Drug use: No     MEDICATIONS   Current Medication:  Current Facility-Administered Medications:  .  albuterol (PROVENTIL,VENTOLIN) solution continuous neb, 5 mg/hr, Nebulization, Once, Marjean Donna E, MD .  fentaNYL 2539mg in NS 2540m (107mml) infusion-PREMIX, 0-400 mcg/hr, Intravenous, Continuous, FunVanessa DurhamD, Last Rate: 5 mL/hr at 02/27/19 1652, 50 mcg/hr at 02/27/19 1652 .  propofol (DIPRIVAN) 1000 MG/100ML infusion, 5-80 mcg/kg/min, Intravenous, Titrated, FunVanessa DurhamD, Last Rate: 21.8 mL/hr at 02/27/19 1653, 40 mcg/kg/min at 02/27/19 1653  Current Outpatient Medications:  .  chlorpheniramine-HYDROcodone (TUSSIONEX PENNKINETIC ER) 10-8 MG/5ML SUER, Take 5 mLs by mouth every 12 (twelve) hours as needed for cough., Disp: 70 mL, Rfl: 0 .  fluticasone furoate-vilanterol (BREO ELLIPTA) 200-25 MCG/INH AEPB, Inhale 1 puff into the lungs daily., Disp: 1 each, Rfl: 0 .  furosemide (LASIX) 20 MG tablet, Take 20 mg by mouth daily., Disp: , Rfl:  .  ipratropium-albuterol (DUONEB) 0.5-2.5 (3) MG/3ML SOLN, Take 3 mLs by nebulization every 6 (six) hours as needed., Disp: 360 mL, Rfl: 0 .  levofloxacin (LEVAQUIN) 500 MG tablet, Take 1 tablet (500 mg total) by mouth daily. (Patient not taking: Reported on 02/04/2019), Disp: 7 tablet, Rfl: 0 .  lisinopril (PRINIVIL,ZESTRIL) 10 MG tablet, Take 20 mg by mouth daily., Disp: , Rfl:  .  LORazepam (ATIVAN) 0.5 MG tablet, Take 0.5 mg by mouth every 8 (eight) hours as needed for anxiety., Disp: , Rfl:  .  predniSONE (DELTASONE) 10 MG tablet, Use per dose pack, Disp: 21 tablet, Rfl: 0 .  traMADol (ULTRAM) 50 MG tablet, Take 50 mg by mouth every 6 (six) hours as needed., Disp: , Rfl:  .  VENTOLIN HFA 108 (90 Base) MCG/ACT inhaler, , Disp: , Rfl:     ALLERGIES   Patient has no known  allergies.    REVIEW OF SYSTEMS     Unable to obtain due to sedation on MV  PHYSICAL EXAMINATION   Vitals:   02/27/19 1725 02/27/19 1730  BP: 108/72 108/73  Pulse: 78 73  Resp: 19 (!) 22  Temp: 98.8 F (37.1 C) 99.1 F (37.3 C)  SpO2: 100% 99%    GENERAL:sedated on MV HEAD: Normocephalic, atraumatic.  EYES: Pupils equal, round, reactive to light.  No scleral icterus.  MOUTH: Moist  mucosal membrane. NECK: Supple. No thyromegaly. No nodules. No JVD.  PULMONARY: mild rhonchorous bs CARDIOVASCULAR: S1 and S2. Regular rate and rhythm. No murmurs, rubs, or gallops.  GASTROINTESTINAL: Soft, nontender, non-distended. No masses. Positive bowel sounds. No hepatosplenomegaly.  MUSCULOSKELETAL: No swelling, clubbing, or edema.  NEUROLOGIC: Mild distress due to acute illness SKIN:intact,warm,dry   PERTINENT DATA         Infusions: . fentaNYL infusion INTRAVENOUS 50 mcg/hr (02/27/19 1652)  . propofol (DIPRIVAN) infusion 40 mcg/kg/min (02/27/19 1653)   Scheduled Medications: . albuterol  5 mg/hr Nebulization Once   PRN Medications:  Hemodynamic parameters:   Intake/Output: No intake/output data recorded.  Ventilator  Settings: Vent Mode: AC FiO2 (%):  [50 %] 50 % Set Rate:  [22 bmp] 22 bmp Vt Set:  [500 mL] 500 mL PEEP:  [5 cmH20] 5 cmH20    LAB RESULTS:  Basic Metabolic Panel: Recent Labs  Lab 02/27/19 1404  NA 136  K 4.7  CL 89*  CO2 37*  GLUCOSE 110*  BUN 28*  CREATININE 0.86  CALCIUM 8.9  MG 2.3   Liver Function Tests: Recent Labs  Lab 02/27/19 1404  AST 27  ALT 48*  ALKPHOS 48  BILITOT 0.8  PROT 6.3*  ALBUMIN 3.6   No results for input(s): LIPASE, AMYLASE in the last 168 hours. No results for input(s): AMMONIA in the last 168 hours. CBC: Recent Labs  Lab 02/27/19 1404  WBC 9.9  NEUTROABS 7.3  HGB 16.4*  HCT 53.2*  MCV 95.3  PLT 216   Cardiac Enzymes: No results for input(s): CKTOTAL, CKMB, CKMBINDEX, TROPONINI in the last 168 hours. BNP: Invalid input(s): POCBNP CBG: Recent Labs  Lab 02/27/19 1625  GLUCAP 143*     IMAGING RESULTS:  Imaging: Dg Chest 1 View  Result Date: 02/27/2019 CLINICAL DATA:  Shortness of breath EXAM: CHEST  1 VIEW COMPARISON:  February 04, 2019 FINDINGS: There is no edema or consolidation. Heart is mildly enlarged with mild pulmonary venous hypertension. No adenopathy. There is aortic  atherosclerosis. No bone lesions. IMPRESSION: Mild cardiac enlargement with pulmonary vascular congestion. No edema or consolidation. Aortic Atherosclerosis (ICD10-I70.0). Electronically Signed   By: Lowella Grip III M.D.   On: 02/27/2019 14:18   Dg Chest Portable 1 View  Result Date: 02/27/2019 CLINICAL DATA:  Respiratory failure EXAM: PORTABLE CHEST 1 VIEW COMPARISON:  Same-day radiograph FINDINGS: Interval placement of endotracheal tube with distal tip terminating approximately 3.8 cm superior to the carina. Enteric tube courses below the diaphragm with distal tip beyond the inferior margin of the film. Stable cardiomediastinal contours. Calcific aortic knob. Chronic mild pulmonary vascular congestion. Linear bibasilar opacities, favor atelectasis. No focal airspace consolidation, pleural effusion, or pneumothorax. IMPRESSION: Satisfactory positioning of endotracheal and enteric tubes. Electronically Signed   By: Davina Poke M.D.   On: 02/27/2019 17:12   Dg Chest Portable 1 View  Result Date: 02/27/2019 CLINICAL DATA:  Worsening shortness of breath for 1 week. History of asthma, congestive heart failure and diabetes. EXAM:  PORTABLE CHEST 1 VIEW COMPARISON:  Radiographs 02/27/2019 and 02/04/2019. FINDINGS: 1614 hours. Lower lung volumes with resulting bibasilar atelectasis. There is no confluent airspace opacity, pleural effusion, edema or pneumothorax. There is mild vascular congestion. The heart size and mediastinal contours are stable with aortic atherosclerosis. The bones appear unchanged. IMPRESSION: Lower lung volumes with resulting mild bibasilar atelectasis. Otherwise stable chest with chronic pulmonary vascular congestion. Electronically Signed   By: Richardean Sale M.D.   On: 02/27/2019 16:39      ASSESSMENT AND PLAN    -Multidisciplinary rounds held today  Acute on chronic Hypoxic and hypercarbic Respiratory Failure -continue Full MV support -Unasyn for possible aspiration  due to RLL infiltrate -continue Bronchodilator Therapy -Wean Fio2 and PEEP as tolerated -will perform SAT/SBT when respiratory parameters are met -patient had been intubated for AECOPD in our MICU in past    NEUROLOGY - intubated and sedated - minimal sedation to achieve a RASS goal: -1 - currently on fentanyl and Propofol  - TG obtained on admit - may need to trend as its mod elevated Wake up assessment in am    Possible drug overdose / suicidal attempt    - UDS pending  - patient recently lost mother    - will need official psychiatric evaluation for SI post extubation     Advanced COPD -continue DuoNEbs -Solumedrol 40 q12 - 8 PEEP on MV to recruit atelectatic segments - most recent PFT with severe obstructive ventilatory defect in 2019    Keratoconjuctivitis of right eye tobradex q12h BID x 3 days for now   ID -continue IV abx as prescibed -follow up cultures  GI/Nutrition GI PROPHYLAXIS as indicated DIET-->TF's as tolerated Constipation protocol as indicated  ENDO - ICU hypoglycemic\Hyperglycemia protocol -check FSBS per protocol   ELECTROLYTES -follow labs as needed -replace as needed -pharmacy consultation   DVT/GI PRX ordered -SCDs  TRANSFUSIONS AS NEEDED MONITOR FSBS ASSESS the need for LABS as needed   Critical care provider statement:    Critical care time (minutes):  36   Critical care time was exclusive of:  Separately billable procedures and treating other patients   Critical care was necessary to treat or prevent imminent or life-threatening deterioration of the following conditions:  Acute on chronic hypoxemic/hypercapnic resp failure, advanced COPD, multiple comorbid conditions.    Critical care was time spent personally by me on the following activities:  Development of treatment plan with patient or surrogate, discussions with consultants, evaluation of patient's response to treatment, examination of patient, obtaining history from  patient or surrogate, ordering and performing treatments and interventions, ordering and review of laboratory studies and re-evaluation of patient's condition.  I assumed direction of critical care for this patient from another provider in my specialty: no    This document was prepared using Dragon voice recognition software and may include unintentional dictation errors.    Ottie Glazier, M.D.  Division of [place]

## 2019-02-27 NOTE — ED Notes (Signed)
ED TO INPATIENT HANDOFF REPORT  ED Nurse Name and Phone #: Antonieta Pert, [phone]  S Name/Age/Gender Shelly [name] 58 y.o. female Room/Bed: ED14A/ED14A  Code Status   Code Status: Prior  Home/SNF/Other Home Patient oriented to: self Is this baseline? No   Triage Complete: Triage complete  Chief Complaint sob  Triage Note Patient from home via [facility]. Patient reports worsening shortness of breath for the last few days. Reports her mother passed away a week ago and she thinks she started feeling bad then.Patient states SOB worse with laying down. States occasional cough. Denies fever at home. Patient reports history of CHF and COPD. Wears 3L [place] as needed. Patient reports compliance with medication.   Patient also complaining of drainage from right eye. Reports caring for a neighbor that has recently had pink eye.    Allergies No Known Allergies  Level of Care/Admitting Diagnosis ED Disposition    ED Disposition Condition [facility] Area: [place] [[id number]]  Level of Care: ICU [6]  Covid Evaluation: Confirmed COVID Negative  Diagnosis: Acute on chronic respiratory failure with hypoxia and hypercapnia [id number]  Admitting Physician: Hyman Bible DODD [id number]  Attending Physician: Hyman Bible DODD [id number]  Estimated length of stay: past midnight tomorrow  Certification:: I certify this patient will need inpatient services for at least 2 midnights  PT Class (Do Not Modify): Inpatient [101]  PT Acc Code (Do Not Modify): Private [1]       B Medical/Surgery History Past Medical History:  Diagnosis Date  . Asthma   . CHF (congestive heart failure) ([place])   . COPD (chronic obstructive pulmonary disease) ([place])   . Diabetes mellitus without complication ([place])   . Hypertension   . Sleep apnea    Past Surgical History:  Procedure Laterality Date  . ABDOMINAL HYSTERECTOMY    . COLON SURGERY       A IV  Location/Drains/Wounds Patient Lines/Drains/Airways Status   Active Line/Drains/Airways    Name:   Placement date:   Placement time:   Site:   Days:   Peripheral IV 02/27/19 Right Wrist   02/27/19    1446    Wrist   less than 1   Peripheral IV 02/27/19 Left Wrist   02/27/19    1454    Wrist   less than 1   Peripheral IV 02/27/19 Right Antecubital   02/27/19    1656    Antecubital   less than 1   NG/OG Tube Orogastric 18 Fr. Right mouth Xray;Aucultation Documented cm marking at nare/ corner of mouth 60 cm   02/27/19    1636    Right mouth   less than 1   Urethral Catheter Rgh[place]den, rn Latex;Temperature probe;Straight-tip 16 Fr.   02/27/19    1719    Latex;Temperature probe;Straight-tip   less than 1   External Urinary Catheter   02/27/19    1511    -   less than 1   Airway 7.5 mm   02/27/19    1632     less than 1          Intake/Output Last 24 hours  Intake/Output Summary (Last 24 hours) at 02/27/2019 1813 Last data filed at 02/27/2019 1811 Gross per 24 hour  Intake 30.46 ml  Output 450 ml  Net -419.54 ml    Labs/Imaging Results for orders placed or performed during the hospital encounter of 02/27/19 (from the past 48 hour(s))  CBC with  Differential     Status: Abnormal   Collection Time: 02/27/19  2:04 PM  Result Value Ref Range   WBC 9.9 4.0 - 10.5 K/uL   RBC 5.58 (H) 3.87 - 5.11 MIL/uL   Hemoglobin 16.4 (H) 12.0 - 15.0 g/dL   HCT 53.2 (H) 36.0 - 46.0 %   MCV 95.3 80.0 - 100.0 fL   MCH 29.4 26.0 - 34.0 pg   MCHC 30.8 30.0 - 36.0 g/dL   RDW 15.1 11.5 - 15.5 %   Platelets 216 150 - 400 K/uL   nRBC 0.0 0.0 - 0.2 %   Neutrophils Relative % 75 %   Neutro Abs 7.3 1.7 - 7.7 K/uL   Lymphocytes Relative 18 %   Lymphs Abs 1.8 0.7 - 4.0 K/uL   Monocytes Relative 7 %   Monocytes Absolute 0.7 0.1 - 1.0 K/uL   Eosinophils Relative 0 %   Eosinophils Absolute 0.0 0.0 - 0.5 K/uL   Basophils Relative 0 %   Basophils Absolute 0.0 0.0 - 0.1 K/uL   Immature Granulocytes 0 %   Abs  Immature Granulocytes 0.04 0.00 - 0.07 K/uL    Comment: Performed at [facility], [address] [place], [place] [address]  Comprehensive metabolic panel     Status: Abnormal   Collection Time: 02/27/19  2:04 PM  Result Value Ref Range   Sodium 136 135 - 145 mmol/L   Potassium 4.7 3.5 - 5.1 mmol/L   Chloride 89 (L) 98 - 111 mmol/L   CO2 37 (H) 22 - 32 mmol/L   Glucose, Bld 110 (H) 70 - 99 mg/dL   BUN 28 (H) 6 - 20 mg/dL   Creatinine, Ser 0.86 0.44 - 1.00 mg/dL   Calcium 8.9 8.9 - 10.3 mg/dL   Total Protein 6.3 (L) 6.5 - 8.1 g/dL   Albumin 3.6 3.5 - 5.0 g/dL   AST 27 15 - 41 U/L   ALT 48 (H) 0 - 44 U/L   Alkaline Phosphatase 48 38 - 126 U/L   Total Bilirubin 0.8 0.3 - 1.2 mg/dL   GFR calc non Af Amer >60 >60 mL/min   GFR calc Af Amer >60 >60 mL/min   Anion gap 10 5 - 15    Comment: Performed at [facility], [address], [place], [place] [address]  Magnesium     Status: None   Collection Time: 02/27/19  2:04 PM  Result Value Ref Range   Magnesium 2.3 1.7 - 2.4 mg/dL    Comment: Performed at [facility], [address] [place], [place] [address]  SARS Coronavirus 2 [facility] order, Performed in [facility] hospital lab) Nasopharyngeal Nasopharyngeal Swab     Status: None   Collection Time: 02/27/19  2:05 PM   Specimen: Nasopharyngeal Swab  Result Value Ref Range   SARS Coronavirus 2 NEGATIVE NEGATIVE    Comment: (NOTE) If result is NEGATIVE SARS-CoV-2 target nucleic acids are NOT DETECTED. The SARS-CoV-2 RNA is generally detectable in upper and lower  respiratory specimens during the acute phase of infection. The lowest  concentration of SARS-CoV-2 viral copies this assay can detect is 250  copies / mL. A negative result does not preclude SARS-CoV-2 infection  and should not be used as the sole basis for treatment or other  patient management decisions.  A negative result may occur with  improper specimen collection / handling, submission  of specimen other  than nasopharyngeal swab, presence of viral mutation(s) within the  areas targeted by this assay, and inadequate number of viral copies  (<250 copies / mL). A negative result must be combined with clinical  observations, patient history, and epidemiological information. If result is POSITIVE SARS-CoV-2 target nucleic acids are DETECTED. The SARS-CoV-2 RNA is generally detectable in upper and lower  respiratory specimens dur ing the acute phase of infection.  Positive  results are indicative of active infection with SARS-CoV-2.  Clinical  correlation with patient history and other diagnostic information is  necessary to determine patient infection status.  Positive results do  not rule out bacterial infection or co-infection with other viruses. If result is PRESUMPTIVE POSTIVE SARS-CoV-2 nucleic acids MAY BE PRESENT.   A presumptive positive result was obtained on the submitted specimen  and confirmed on repeat testing.  While 2019 novel coronavirus  (SARS-CoV-2) nucleic acids may be present in the submitted sample  additional confirmatory testing may be necessary for epidemiological  and / or clinical management purposes  to differentiate between  SARS-CoV-2 and other Sarbecovirus currently known to infect humans.  If clinically indicated additional testing with an alternate test  methodology [phone]) is advised. The SARS-CoV-2 RNA is generally  detectable in upper and lower respiratory sp ecimens during the acute  phase of infection. The expected result is Negative. Fact Sheet for Patients:  StrictlyIdeas.no Fact Sheet for Healthcare Providers: BankingDealers.co.za This test is not yet approved or cleared by the [place] FDA and has been authorized for detection and/or diagnosis of SARS-CoV-2 by FDA under an Emergency Use Authorization (EUA).  This EUA will remain in effect (meaning this test can be used) for  the duration of the COVID-19 declaration under Section 564(b)(1) of the Act, 21 U.S.C. section 360bbb-3(b)(1), unless the authorization is terminated or revoked sooner. Performed at [facility], [address] [place], [place] [address]   Blood gas, venous     Status: Abnormal   Collection Time: 02/27/19  2:16 PM  Result Value Ref Range   pH, Ven 7.31 7.250 - 7.430   pCO2, Ven 91 (HH) 44.0 - 60.0 mmHg    Comment: CRITICAL RESULT CALLED TO, READ BACK BY AND VERIFIED WITH:  RN LAURA AT 1430, 02/27/19, LS    pO2, Ven 57.0 (H) 32.0 - 45.0 mmHg   Bicarbonate 45.8 (H) 20.0 - 28.0 mmol/L   Acid-Base Excess 15.0 (H) 0.0 - 2.0 mmol/L   O2 Saturation 86.4 %   Patient temperature 37.0    Collection site VENOUS    Sample type VENOUS     Comment: Performed at [facility], [address] [place], [place] [address]  Glucose, capillary     Status: Abnormal   Collection Time: 02/27/19  4:25 PM  Result Value Ref Range   Glucose-Capillary 143 (H) 70 - 99 mg/dL  Blood gas, arterial     Status: Abnormal   Collection Time: 02/27/19  4:58 PM  Result Value Ref Range   FIO2 0.50    Delivery systems VENTILATOR    Mode ASSIST CONTROL    VT 500 mL   LHR 22 resp/min   Peep/cpap 5.0 cm H20   pH, Arterial 7.36 7.350 - 7.450   pCO2 arterial 66 (HH) 32.0 - 48.0 mmHg    Comment: CRITICAL RESULT CALLED TO, READ BACK BY AND VERIFIED WITH:  FUNKE AT 1728, 02/27/2019, LS    pO2, Arterial 68 (L) 83.0 - 108.0 mmHg   Bicarbonate 37.3 (H) 20.0 - 28.0 mmol/L   Acid-Base Excess 9.2 (H)  0.0 - 2.0 mmol/L   O2 Saturation 92.5 %   Patient temperature 37.0    Collection site LEFT BRACHIAL    Sample type ARTERIAL DRAW    Allens test (pass/fail) PASS PASS    Comment: Performed at [facility], [address] [place], [place] [address]  Ethanol     Status: None   Collection Time: 02/27/19  5:00 PM  Result Value Ref Range   Alcohol, Ethyl (B) <10 <10 mg/dL    Comment: (NOTE) Lowest  detectable limit for serum alcohol is 10 mg/dL. For medical purposes only. Performed at [facility], [address] [place], [place] [address]   Triglycerides     Status: Abnormal   Collection Time: 02/27/19  5:00 PM  Result Value Ref Range   Triglycerides 196 (H) <150 mg/dL    Comment: Performed at [facility], [address] [place], [place] [address]   Dg Chest 1 View  Result Date: 02/27/2019 CLINICAL DATA:  Shortness of breath EXAM: CHEST  1 VIEW COMPARISON:  February 04, 2019 FINDINGS: There is no edema or consolidation. Heart is mildly enlarged with mild pulmonary venous hypertension. No adenopathy. There is aortic atherosclerosis. No bone lesions. IMPRESSION: Mild cardiac enlargement with pulmonary vascular congestion. No edema or consolidation. Aortic Atherosclerosis (ICD10-I70.0). Electronically Signed   By: Lowella Grip III M.D.   On: 02/27/2019 14:18   Ct Head Wo Contrast  Result Date: 02/27/2019 CLINICAL DATA:  Altered level of consciousness EXAM: CT HEAD WITHOUT CONTRAST TECHNIQUE: Contiguous axial images were obtained from the base of the skull through the vertex without intravenous contrast. COMPARISON:  12/12/2017 FINDINGS: Brain: No acute intracranial abnormality. Specifically, no hemorrhage, hydrocephalus, mass lesion, acute infarction, or significant intracranial injury. Vascular: No hyperdense vessel or unexpected calcification. Skull: No acute calvarial abnormality. Sinuses/Orbits: Visualized paranasal sinuses and mastoids clear. Orbital soft tissues unremarkable. Other: None IMPRESSION: Normal study. Electronically Signed   By: Rolm Baptise M.D.   On: 02/27/2019 18:01   Dg Chest Portable 1 View  Result Date: 02/27/2019 CLINICAL DATA:  Respiratory failure EXAM: PORTABLE CHEST 1 VIEW COMPARISON:  Same-day radiograph FINDINGS: Interval placement of endotracheal tube with distal tip terminating approximately 3.8 cm superior to the carina. Enteric tube  courses below the diaphragm with distal tip beyond the inferior margin of the film. Stable cardiomediastinal contours. Calcific aortic knob. Chronic mild pulmonary vascular congestion. Linear bibasilar opacities, favor atelectasis. No focal airspace consolidation, pleural effusion, or pneumothorax. IMPRESSION: Satisfactory positioning of endotracheal and enteric tubes. Electronically Signed   By: Davina Poke M.D.   On: 02/27/2019 17:12   Dg Chest Portable 1 View  Result Date: 02/27/2019 CLINICAL DATA:  Worsening shortness of breath for 1 week. History of asthma, congestive heart failure and diabetes. EXAM: PORTABLE CHEST 1 VIEW COMPARISON:  Radiographs 02/27/2019 and 02/04/2019. FINDINGS: 1614 hours. Lower lung volumes with resulting bibasilar atelectasis. There is no confluent airspace opacity, pleural effusion, edema or pneumothorax. There is mild vascular congestion. The heart size and mediastinal contours are stable with aortic atherosclerosis. The bones appear unchanged. IMPRESSION: Lower lung volumes with resulting mild bibasilar atelectasis. Otherwise stable chest with chronic pulmonary vascular congestion. Electronically Signed   By: Richardean Sale M.D.   On: 02/27/2019 16:39    Pending Labs Unresulted Labs (From admission, onward)    Start     Ordered   02/27/19 1616  Triglycerides  (propofol (DIPRIVAN) infusion)  Every 72 hours,   STAT    Comments: while  on propofol (DIPRIVAN)    02/27/19 1615   02/27/19 1559  Urine Drug Screen, Qualitative ([facility] only)  Once,   STAT     02/27/19 1559   Signed and Held  HIV antibody (Routine Testing)  Once,   R     Signed and Held   Signed and Held  Basic metabolic panel  Tomorrow morning,   R     Signed and Held   Signed and Held  CBC  Tomorrow morning,   R     Signed and Held   Signed and Held  Procalcitonin - Baseline  Add-on,   STAT     Signed and Held   Signed and Held  Brain natriuretic peptide  Add-on,   R     Signed and Held           Vitals/Pain Today's Vitals   02/27/19 1720 02/27/19 1724 02/27/19 1725 02/27/19 1730  BP:   108/72 108/73  Pulse: 80  78 73  Resp: (!) 22  19 (!) 22  Temp: 98.4 F (36.9 C)  98.8 F (37.1 C) 99.1 F (37.3 C)  TempSrc:      SpO2: 97%  100% 99%  Weight:      Height:      PainSc:  Asleep      Isolation Precautions No active isolations  Medications Medications  propofol (DIPRIVAN) 1000 MG/100ML infusion (30 mcg/kg/min  90.7 kg Intravenous Transfusing/Transfer 02/27/19 1811)  fentaNYL 2529mcg in NS 240mL (43mcg/ml) infusion-PREMIX (50 mcg/hr Intravenous Transfusing/Transfer 02/27/19 1811)  albuterol (PROVENTIL,VENTOLIN) solution continuous neb (has no administration in time range)  ipratropium-albuterol (DUONEB) 0.5-2.5 (3) MG/3ML nebulizer solution 3 mL (3 mLs Nebulization Given 02/27/19 1440)  ipratropium-albuterol (DUONEB) 0.5-2.5 (3) MG/3ML nebulizer solution 3 mL (3 mLs Nebulization Given 02/27/19 1440)  ipratropium-albuterol (DUONEB) 0.5-2.5 (3) MG/3ML nebulizer solution 3 mL (3 mLs Nebulization Given 02/27/19 1440)  furosemide (LASIX) injection 40 mg (40 mg Intravenous Given 02/27/19 1444)  methylPREDNISolone sodium succinate (SOLU-MEDROL) 125 mg/2 mL injection 125 mg (125 mg Intravenous Given 02/27/19 1443)  etomidate (AMIDATE) injection 20 mg (20 mg Intravenous Given 02/27/19 1628)  succinylcholine (ANECTINE) injection 130 mg (130 mg Intravenous Given 02/27/19 1629)  0.9 %  sodium chloride infusion ( Intravenous Transfusing/Transfer 02/27/19 1810)  etomidate (AMIDATE) injection (27.21 mg Intravenous Given 02/27/19 1634)  succinylcholine (ANECTINE) injection (136.05 mg Intravenous Given 02/27/19 1635)    Mobility walks Low fall risk   Focused Assessments Neuro Assessment Handoff:  Swallow screen pass? No  Cardiac Rhythm: Normal sinus rhythm       Neuro Assessment:   Neuro Checks:      Last Documented NIHSS Modified Score:   Has TPA been given? No If patient is a  Neuro Trauma and patient is going to OR before floor call report to [place] nurse: [phone] or [phone]  , Pulmonary Assessment Handoff:  Lung sounds: Bilateral Breath Sounds: Diminished, Expiratory wheezes O2 Device: Bi-PAP O2 Flow Rate (L/min): 4 L/min      R Recommendations: See Admitting Provider Note  Report given to:   Additional Notes: Pt has excessive saliva production.

## 2019-02-27 NOTE — ED Notes (Signed)
Called lab regarding result of SARS. Lab asked me to wait 10 more minutes, said it was still pending.

## 2019-02-27 NOTE — ED Notes (Signed)
While this RN starting IV and finishing assessing patient, patient is falling asleep during sentences and unable to answer questions appropriately. Significant periods of apnea noted when patient is sleeping. Oxygen saturation dropping to 82% on 4L [place].  MD made aware of patient's condition. New orders placed.

## 2019-02-27 NOTE — ED Triage Notes (Addendum)
Patient from home via [facility]. Patient reports worsening shortness of breath for the last few days. Reports her mother passed away a week ago and she thinks she started feeling bad then.Patient states SOB worse with laying down. States occasional cough. Denies fever at home. Patient reports history of CHF and COPD. Wears 3L [place] as needed. Patient reports compliance with medication.   Patient also complaining of drainage from right eye. Reports caring for a neighbor that has recently had pink eye.

## 2019-02-27 NOTE — ED Provider Notes (Signed)
[facility] Emergency Department Provider Note  ____________________________________________   First MD Initiated Contact with Patient 02/27/19 1343     (approximate)  I have reviewed the triage vital signs and the nursing notes.   HISTORY  Chief Complaint Shortness of Breath and Eye Drainage    HPI Shelly [name] is a 58 y.o. female with hypertension, diabetes, COPD on 3 L who presents for eye drainage.  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  She is now had 2 days of bilateral eye redness with some drainage from them that has been constant, nothing makes better, nothing makes it worse.  She also endorses having some new jerking motion of her right arm.  No medication changes.  She denies any worsening shortness of breath, chest pain.  She takes 20 of Lasix daily.  She says she just overall does not feel well but is unsure exactly why.      Past Medical History:  Diagnosis Date   Asthma    COPD (chronic obstructive pulmonary disease) ([place])    Diabetes mellitus without complication ([place])    Hypertension    Sleep apnea     Patient Active Problem List   Diagnosis Date Noted   DM (diabetes mellitus), type 2 ([place]) 09/08/2016   COPD exacerbation ([place]) 09/07/2016   Neck pain 04/02/2016   Abdominal pain 01/04/2014    Past Surgical History:  Procedure Laterality Date   ABDOMINAL HYSTERECTOMY     COLON SURGERY      Prior to Admission medications   Medication Sig Start Date End Date Taking? Authorizing Provider  chlorpheniramine-HYDROcodone (TUSSIONEX PENNKINETIC ER) 10-8 MG/5ML SUER Take 5 mLs by mouth every 12 (twelve) hours as needed for cough. 12/07/18   Scarboro, Audie Clear, NP  fluticasone furoate-vilanterol (BREO ELLIPTA) 200-25 MCG/INH AEPB Inhale 1 puff into the lungs daily. 09/09/16   Fritzi Mandes, MD  furosemide (LASIX) 20 MG tablet Take 20 mg by mouth daily.    [provider]    ipratropium-albuterol (DUONEB) 0.5-2.5 (3) MG/3ML SOLN Take 3 mLs by nebulization every 6 (six) hours as needed. 09/09/16   Fritzi Mandes, MD  levofloxacin (LEVAQUIN) 500 MG tablet Take 1 tablet (500 mg total) by mouth daily. Patient not taking: Reported on 02/04/2019 11/07/18   Kendell Bane, NP  lisinopril (PRINIVIL,ZESTRIL) 10 MG tablet Take 20 mg by mouth daily.    [provider]  LORazepam (ATIVAN) 0.5 MG tablet Take 0.5 mg by mouth every 8 (eight) hours as needed for anxiety.    [provider]  predniSONE (DELTASONE) 10 MG tablet Use per dose pack 11/07/18   Scarboro, Audie Clear, NP  traMADol (ULTRAM) 50 MG tablet Take 50 mg by mouth every 6 (six) hours as needed.    [provider]  VENTOLIN HFA 108 [phone] Base) MCG/ACT inhaler  08/11/17   [provider]    Allergies Patient has no known allergies.  Family History  Adopted: Yes  Family history unknown: Yes    Social History Social History   Tobacco Use   Smoking status: Former Smoker   Smokeless tobacco: Never Used  Substance Use Topics   Alcohol use: No   Drug use: No      Review of Systems Constitutional: No fever/chills + feeing unwell  Eyes: No visual changes. +redness to eyes  ENT: No sore throat. Cardiovascular: No chest pain Respiratory: No SOB.  Gastrointestinal: No abdominal pain.  No  nausea, no vomiting.  No diarrhea.  No constipation. Genitourinary: Negative for dysuria. Musculoskeletal: Negative for back pain. Skin: Negative for rash. Neurological: Negative for headaches, focal weakness or numbness. +tremors  All other ROS negative ____________________________________________   PHYSICAL EXAM:  VITAL SIGNS: Blood pressure 118/80, pulse 100, temperature 98.4 F (36.9 C), temperature source Oral, resp. rate 18, height 5\' 5"  (1.651 m), weight 90.7 kg, SpO2 93 %.   Constitutional: No acute distress but occasionally nods off to sleep and not very alert  Eyes:  Conjunctivae redness bilaterally.  EOMI. Head: Atraumatic. Nose: No congestion/rhinnorhea. Mouth/Throat: Mucous membranes are moist.   Neck: No stridor. Trachea Midline. FROM Cardiovascular: Normal rate, regular rhythm. Grossly normal heart sounds.  Good peripheral circulation. Respiratory: Bilateral expiratory wheezing, normal work of breathing on 3 L Gastrointestinal: Soft and nontender. No distention. No abdominal bruits.  Musculoskeletal: 1+ edema bilaterally no joint effusions. Neurologic:  Normal speech and language. No gross focal neurologic deficits are appreciated although slightly sleepy.  Skin:  Skin is warm, dry and intact. No rash noted. Psychiatric: Odd affect, slight tremor noted in her arms. GU: Deferred   ____________________________________________   LABS (all labs ordered are listed, but only abnormal results are displayed)  Labs Reviewed  CBC WITH DIFFERENTIAL/PLATELET - Abnormal; Notable for the following components:      Result Value   RBC 5.58 (*)    Hemoglobin 16.4 (*)    HCT 53.2 (*)    All other components within normal limits  COMPREHENSIVE METABOLIC PANEL - Abnormal; Notable for the following components:   Chloride 89 (*)    CO2 37 (*)    Glucose, Bld 110 (*)    BUN 28 (*)    Total Protein 6.3 (*)    ALT 48 (*)    All other components within normal limits  BLOOD GAS, VENOUS - Abnormal; Notable for the following components:   pCO2, Ven 91 (*)    pO2, Ven 57.0 (*)    Bicarbonate 45.8 (*)    Acid-Base Excess 15.0 (*)    All other components within normal limits  TRIGLYCERIDES - Abnormal; Notable for the following components:   Triglycerides 196 (*)    All other components within normal limits  GLUCOSE, CAPILLARY - Abnormal; Notable for the following components:   Glucose-Capillary 143 (*)    All other components within normal limits  BLOOD GAS, ARTERIAL - Abnormal; Notable for the following components:   pCO2 arterial 66 (*)    pO2, Arterial 68  (*)    Bicarbonate 37.3 (*)    Acid-Base Excess 9.2 (*)    All other components within normal limits  SARS CORONAVIRUS 2 (HOSPITAL ORDER, [place] [facility])  MAGNESIUM  ETHANOL  URINE DRUG SCREEN, QUALITATIVE ([facility] ONLY)   ____________________________________________   ED ECG REPORT I, Vanessa [place], the attending physician, personally viewed and interpreted this ECG.  EKG is normal sinus rate of 94, no ST elevation, T wave inversion aVL and V2, PVC, normal intervals.  T wave inversion V2 appears new ____________________________________________  RADIOLOGY Robert Bellow, personally viewed and evaluated these images (plain radiographs) as part of my medical decision making, as well as reviewing the written report by the radiologist.  ED MD interpretation: No pneumonia  Official radiology report(s): Dg Chest 1 View  Result Date: 02/27/2019 CLINICAL DATA:  Shortness of breath EXAM: CHEST  1 VIEW COMPARISON:  February 04, 2019 FINDINGS: There is no edema or consolidation. Heart is  mildly enlarged with mild pulmonary venous hypertension. No adenopathy. There is aortic atherosclerosis. No bone lesions. IMPRESSION: Mild cardiac enlargement with pulmonary vascular congestion. No edema or consolidation. Aortic Atherosclerosis (ICD10-I70.0). Electronically Signed   By: Lowella Grip III M.D.   On: 02/27/2019 14:18   Ct Head Wo Contrast  Result Date: 02/27/2019 CLINICAL DATA:  Altered level of consciousness EXAM: CT HEAD WITHOUT CONTRAST TECHNIQUE: Contiguous axial images were obtained from the base of the skull through the vertex without intravenous contrast. COMPARISON:  12/12/2017 FINDINGS: Brain: No acute intracranial abnormality. Specifically, no hemorrhage, hydrocephalus, mass lesion, acute infarction, or significant intracranial injury. Vascular: No hyperdense vessel or unexpected calcification. Skull: No acute calvarial abnormality. Sinuses/Orbits: Visualized  paranasal sinuses and mastoids clear. Orbital soft tissues unremarkable. Other: None IMPRESSION: Normal study. Electronically Signed   By: Rolm Baptise M.D.   On: 02/27/2019 18:01   Dg Chest Portable 1 View  Result Date: 02/27/2019 CLINICAL DATA:  Respiratory failure EXAM: PORTABLE CHEST 1 VIEW COMPARISON:  Same-day radiograph FINDINGS: Interval placement of endotracheal tube with distal tip terminating approximately 3.8 cm superior to the carina. Enteric tube courses below the diaphragm with distal tip beyond the inferior margin of the film. Stable cardiomediastinal contours. Calcific aortic knob. Chronic mild pulmonary vascular congestion. Linear bibasilar opacities, favor atelectasis. No focal airspace consolidation, pleural effusion, or pneumothorax. IMPRESSION: Satisfactory positioning of endotracheal and enteric tubes. Electronically Signed   By: Davina Poke M.D.   On: 02/27/2019 17:12   Dg Chest Portable 1 View  Result Date: 02/27/2019 CLINICAL DATA:  Worsening shortness of breath for 1 week. History of asthma, congestive heart failure and diabetes. EXAM: PORTABLE CHEST 1 VIEW COMPARISON:  Radiographs 02/27/2019 and 02/04/2019. FINDINGS: 1614 hours. Lower lung volumes with resulting bibasilar atelectasis. There is no confluent airspace opacity, pleural effusion, edema or pneumothorax. There is mild vascular congestion. The heart size and mediastinal contours are stable with aortic atherosclerosis. The bones appear unchanged. IMPRESSION: Lower lung volumes with resulting mild bibasilar atelectasis. Otherwise stable chest with chronic pulmonary vascular congestion. Electronically Signed   By: Richardean Sale M.D.   On: 02/27/2019 16:39    ____________________________________________   PROCEDURES  Procedure(s) performed (including Critical Care):  .Critical Care Performed by: Vanessa [place], MD Authorized by: Vanessa , MD   Critical care provider statement:    Critical care time  (minutes):  45   Critical care was necessary to treat or prevent imminent or life-threatening deterioration of the following conditions:  Respiratory failure   Critical care was time spent personally by me on the following activities:  Discussions with consultants, evaluation of patient's response to treatment, examination of patient, ordering and performing treatments and interventions, ordering and review of laboratory studies, ordering and review of radiographic studies, pulse oximetry, re-evaluation of patient's condition, obtaining history from patient or surrogate and review of old charts  Procedure Name: Intubation Date/Time: 02/27/2019 6:26 PM Performed by: Vanessa , MD Pre-anesthesia Checklist: Patient identified, Patient being monitored, Emergency Drugs available, Timeout performed and Suction available Preoxygenation: Pre-oxygenation with 100% oxygen (bipap) Induction Type: Rapid sequence Ventilation: Mask ventilation without difficulty Laryngoscope Size: Glidescope and 4 Grade View: Grade I Tube type: Non-subglottic suction tube Tube size: 7.5 mm Number of attempts: 1 Placement Confirmation: ETT inserted through vocal cords under direct vision,  CO2 detector and Breath sounds checked- equal and bilateral Tube secured with: ETT holder Difficulty Due To: Difficulty was unanticipated        ____________________________________________  INITIAL IMPRESSION / ASSESSMENT AND PLAN / ED COURSE   YAMILEZ STANTON was evaluated in Emergency Department on 02/27/2019 for the symptoms described in the history of present illness. She was evaluated in the context of the global COVID-19 pandemic, which necessitated consideration that the patient might be at risk for infection with the SARS-CoV-2 virus that causes COVID-19. Institutional protocols and algorithms that pertain to the evaluation of patients at risk for COVID-19 are in a state of rapid change based on information released by  regulatory bodies including the CDC and federal and state organizations. These policies and algorithms were followed during the patient's care in the ED.    Patient presents with bilateral eye redness as a chief complaint.  She denies any shortness of breath although she has bilateral expiratory wheezing and she occasionally will desat down to 85% on her baseline 3 L.  Although she does not appear to be in any acute distress with her breathing I suspect that her CO2 may be elevated given her odd affect as well as the tremors in her arms.  Will get VBG to further evaluate.  PNA-will get xray to evaluation Anemia-CBC to evaluate ACS- will get trops Arrhythmia-Will get EKG and keep on monitor.  COVID- will get testing per algorithm. PE-lower suspicion given no risk factors and other cause more likely  Labs are reassuring with normal hemoglobin.  No evidence of white count elevation.  Chest x-ray shows some pulmonary vascular congestion with mild cardiac enlargement.  Given patient does have some edema in her legs will give a dose of IV Lasix.  VBG shows a PCO2 of 91.  Will start patient on BiPAP given this could be causing patient slightly altered mental status.  Patient although seemingly confused is still awke and protecting airway and will tolerate a trial of BiPAP.  3:54 PM COVID negative.   3:58 PM reevaluated patient.  Patient seems more sleepy on the BiPAP.  Pt noted to be drooling. Will get a repeat VBG to make sure that is improving.  4:10 PM Prior to VBG patient had an episode where she desat in the 67s due to her dentures being out of her mouth and inside the mass.  Patient was repositioned and turned up from 45% to 55%.  Patient was still desaturating into the 80s.  Given this patient continues to be altered with worsening mental status we will will intubate patient given not protecting airway.   Pt was intubating, see procedure note. Prop and fentanyl started.   Chest xray confirm  placement.   Repeat gas improved.  Will admit to ICU.      ____________________________________________   FINAL CLINICAL IMPRESSION(S) / ED DIAGNOSES   Final diagnoses:  Acute on chronic respiratory failure with hypoxia and hypercapnia ([place])     MEDICATIONS GIVEN DURING THIS VISIT:  Medications  propofol (DIPRIVAN) 1000 MG/100ML infusion (30 mcg/kg/min  90.7 kg Intravenous Transfusing/Transfer 02/27/19 1811)  fentaNYL 2542mcg in NS 252mL (102mcg/ml) infusion-PREMIX (50 mcg/hr Intravenous Transfusing/Transfer 02/27/19 1811)  albuterol (PROVENTIL,VENTOLIN) solution continuous neb (has no administration in time range)  ipratropium-albuterol (DUONEB) 0.5-2.5 (3) MG/3ML nebulizer solution 3 mL (3 mLs Nebulization Given 02/27/19 1440)  ipratropium-albuterol (DUONEB) 0.5-2.5 (3) MG/3ML nebulizer solution 3 mL (3 mLs Nebulization Given 02/27/19 1440)  ipratropium-albuterol (DUONEB) 0.5-2.5 (3) MG/3ML nebulizer solution 3 mL (3 mLs Nebulization Given 02/27/19 1440)  furosemide (LASIX) injection 40 mg (40 mg Intravenous Given 02/27/19 1444)  methylPREDNISolone sodium succinate (SOLU-MEDROL) 125 mg/2 mL injection 125  mg (125 mg Intravenous Given 02/27/19 1443)  etomidate (AMIDATE) injection 20 mg (20 mg Intravenous Given 02/27/19 1628)  succinylcholine (ANECTINE) injection 130 mg (130 mg Intravenous Given 02/27/19 1629)  0.9 %  sodium chloride infusion ( Intravenous Transfusing/Transfer 02/27/19 1810)  etomidate (AMIDATE) injection (27.21 mg Intravenous Given 02/27/19 1634)  succinylcholine (ANECTINE) injection (136.05 mg Intravenous Given 02/27/19 1635)     ED Discharge Orders    None       Note:  This document was prepared using Dragon voice recognition software and may include unintentional dictation errors.   Vanessa [place], MD 02/27/19 [phone]

## 2019-02-27 NOTE — H&P (Addendum)
[place] [facility] [place] NAME: Shelly [name]    MR#:  [id number]  DATE OF BIRTH:  [birth year]  DATE OF ADMISSION:  02/27/2019  PRIMARY CARE PHYSICIAN: [name], NP   REQUESTING/REFERRING PHYSICIAN: Marjean Donna, MD  CHIEF COMPLAINT:   Chief Complaint  Patient presents with  . Shortness of Breath  . Eye Drainage  . Tremors    HISTORY OF PRESENT ILLNESS:  Shelly [name]  is a 58 y.o. female with a known history of chronic respiratory failure secondary to COPD on 3 L O2 at baseline, type 2 diabetes, hypertension, OSA, CHF who presented to the ED with shortness of breath over the last week.  Patient was intubated on my exam, so history is obtained from the chart.  Apparently, patient's mother passed away about a week ago and she has had progressively worsening shortness of breath since then.  She was also worried about some drainage from her right eye.  In the ED, she was continuously falling asleep and O2 saturations were 82% on 4 L nasal cannula.  VBG showed a pH 7.31, PCO2 of 91, O2 57.  CXR showed some pulmonary vascular congestion.  COVID test was negative.  She was initially placed on BiPAP, but her altered mental status continued to worsen and she was intubated for airway protection.  Hospitalists were called for admission.  PAST MEDICAL HISTORY:   Past Medical History:  Diagnosis Date  . Asthma   . CHF (congestive heart failure) ([place])   . COPD (chronic obstructive pulmonary disease) ([place])   . Diabetes mellitus without complication ([place])   . Hypertension   . Sleep apnea     PAST SURGICAL HISTORY:   Past Surgical History:  Procedure Laterality Date  . ABDOMINAL HYSTERECTOMY    . COLON SURGERY      SOCIAL HISTORY:   Social History   Tobacco Use  . Smoking status: Former Research scientist (life sciences)  . Smokeless tobacco: Never Used  Substance Use Topics  . Alcohol use: No    FAMILY HISTORY:   Family History  Adopted: Yes  Family history  unknown: Yes    DRUG ALLERGIES:  No Known Allergies  REVIEW OF SYSTEMS:   ROS-unable to obtain due to intubation  MEDICATIONS AT HOME:   Prior to Admission medications   Medication Sig Start Date End Date Taking? Authorizing Provider  chlorpheniramine-HYDROcodone (TUSSIONEX PENNKINETIC ER) 10-8 MG/5ML SUER Take 5 mLs by mouth every 12 (twelve) hours as needed for cough. 12/07/18   Scarboro, Audie Clear, NP  fluticasone furoate-vilanterol (BREO ELLIPTA) 200-25 MCG/INH AEPB Inhale 1 puff into the lungs daily. 09/09/16   Fritzi Mandes, MD  furosemide (LASIX) 20 MG tablet Take 20 mg by mouth daily.    [provider]  ipratropium-albuterol (DUONEB) 0.5-2.5 (3) MG/3ML SOLN Take 3 mLs by nebulization every 6 (six) hours as needed. 09/09/16   Fritzi Mandes, MD  levofloxacin (LEVAQUIN) 500 MG tablet Take 1 tablet (500 mg total) by mouth daily. Patient not taking: Reported on 02/04/2019 11/07/18   Kendell Bane, NP  lisinopril (PRINIVIL,ZESTRIL) 10 MG tablet Take 20 mg by mouth daily.    [provider]  LORazepam (ATIVAN) 0.5 MG tablet Take 0.5 mg by mouth every 8 (eight) hours as needed for anxiety.    [provider]  predniSONE (DELTASONE) 10 MG tablet Use per dose pack 11/07/18   Scarboro, Audie Clear, NP  traMADol (ULTRAM) 50 MG tablet Take 50 mg by mouth  every 6 (six) hours as needed.    [provider]  VENTOLIN HFA 108 [phone] Base) MCG/ACT inhaler  08/11/17   [provider]      VITAL SIGNS:  Blood pressure (!) 174/163, pulse 80, temperature 98.4 F (36.9 C), temperature source Oral, resp. rate 19, height 5\' 5"  (1.651 m), weight 90.7 kg, SpO2 (!) 83 %.  PHYSICAL EXAMINATION:  Physical Exam  GENERAL:  58 y.o.-year-old patient lying in the bed with no acute distress.  EYES: Pupils equal, round, reactive to light and accommodation. No scleral icterus. Extraocular muscles intact. +right eye with conjunctival injection and purulent drainage. HEENT: Head atraumatic,  normocephalic. Oropharynx and nasopharynx clear. ETT in place. NECK:  Supple, no jugular venous distention. No thyroid enlargement, no tenderness.  LUNGS: +coarse rhonchi throughout all lung fields.  CARDIOVASCULAR: RRR, S1, S2 normal. No murmurs, rubs, or gallops.  ABDOMEN: Soft, nontender, nondistended. Bowel sounds present. No organomegaly or mass.  EXTREMITIES: No pedal edema, cyanosis, or clubbing.  NEUROLOGIC: Unable to assess due to intubation PSYCHIATRIC: Unable to assess due to intubation SKIN: No obvious rash, lesion, or ulcer.   LABORATORY PANEL:   CBC Recent Labs  Lab 02/27/19 1404  WBC 9.9  HGB 16.4*  HCT 53.2*  PLT 216   ------------------------------------------------------------------------------------------------------------------  Chemistries  Recent Labs  Lab 02/27/19 1404  NA 136  K 4.7  CL 89*  CO2 37*  GLUCOSE 110*  BUN 28*  CREATININE 0.86  CALCIUM 8.9  MG 2.3  AST 27  ALT 48*  ALKPHOS 48  BILITOT 0.8   ------------------------------------------------------------------------------------------------------------------  Cardiac Enzymes No results for input(s): TROPONINI in the last 168 hours. ------------------------------------------------------------------------------------------------------------------  RADIOLOGY:  Dg Chest 1 View  Result Date: 02/27/2019 CLINICAL DATA:  Shortness of breath EXAM: CHEST  1 VIEW COMPARISON:  February 04, 2019 FINDINGS: There is no edema or consolidation. Heart is mildly enlarged with mild pulmonary venous hypertension. No adenopathy. There is aortic atherosclerosis. No bone lesions. IMPRESSION: Mild cardiac enlargement with pulmonary vascular congestion. No edema or consolidation. Aortic Atherosclerosis (ICD10-I70.0). Electronically Signed   By: Lowella Grip III M.D.   On: 02/27/2019 14:18   Dg Chest Portable 1 View  Result Date: 02/27/2019 CLINICAL DATA:  Worsening shortness of breath for 1 week. History  of asthma, congestive heart failure and diabetes. EXAM: PORTABLE CHEST 1 VIEW COMPARISON:  Radiographs 02/27/2019 and 02/04/2019. FINDINGS: 1614 hours. Lower lung volumes with resulting bibasilar atelectasis. There is no confluent airspace opacity, pleural effusion, edema or pneumothorax. There is mild vascular congestion. The heart size and mediastinal contours are stable with aortic atherosclerosis. The bones appear unchanged. IMPRESSION: Lower lung volumes with resulting mild bibasilar atelectasis. Otherwise stable chest with chronic pulmonary vascular congestion. Electronically Signed   By: Richardean Sale M.D.   On: 02/27/2019 16:39      IMPRESSION AND PLAN:   Altered mental status- may be due to hypercarbia vs a possible overdose? (Patient take ativan at home and her mother passed away 1 week ago). VBG with pCO2 91. Intubated due to an inability to protect her airway. -CT head pending -UDS pending -ABG has been ordered  Acute on chronic hypoxic and hypercapneic respiratory failure likely due to acute exacerbation of COPD- patient uses 3L O2 at baseline. -COVID test was negative -Vent management per CCM -Start IV solumedrol -Duonebs every 6 hours -Will give a dose of lasix, as her CXR shows pulmonary vascular congestion -Continue home inhalers -Check procalcitonin  Bacterial conjunctivitis of right eye -Trimethoprim-polymyxin  drops every 4 hours  Chronic diastolic CHF- does not appear volume overloaded, but CXR showed some pulmonary vascular congestion -Will give a dose of IV lasix -Check BNP and ECHO  Hypertension- BP elevated in the ED -Continue home lisinopril  All the records are reviewed and case discussed with ED provider. Management plans discussed with the patient, family and they are in agreement.  CODE STATUS: Full  TOTAL TIME TAKING CARE OF THIS PATIENT: 45 minutes.    Berna Spare  M.D on 02/27/2019 at 5:05 PM  Between 7am to 6pm - Pager - [phone]  After  6pm go to [email or web address] - Proofreader  [facility]  Office  [phone]  CC: Primary care physician; [name], NP   Note: This dictation was prepared with Dragon dictation along with smaller phrase technology. Any transcriptional errors that result from this process are unintentional.

## 2019-02-28 ENCOUNTER — Inpatient Hospital Stay
Admit: 2019-02-28 | Discharge: 2019-02-28 | Disposition: A | Discharge status: Home / Self Care | Payer: Medicare Other | Attending: Internal Medicine | Admitting: Internal Medicine

## 2019-02-28 DIAGNOSIS — I5043 Acute on chronic combined systolic (congestive) and diastolic (congestive) heart failure: Secondary | ICD-10-CM

## 2019-02-28 LAB — BASIC METABOLIC PANEL
Anion gap: 11 (ref 5–15)
BUN: 23 mg/dL — ABNORMAL HIGH (ref 6–20)
CO2: 35 mmol/L — ABNORMAL HIGH (ref 22–32)
Calcium: 8.9 mg/dL (ref 8.9–10.3)
Chloride: 92 mmol/L — ABNORMAL LOW (ref 98–111)
Creatinine, Ser: 0.91 mg/dL (ref 0.44–1.00)
GFR calc Af Amer: >60 mL/min (ref >60)
GFR calc non Af Amer: >60 mL/min (ref >60)
Glucose, Bld: 178 mg/dL — ABNORMAL HIGH (ref 70–99)
Potassium: 4.2 mmol/L (ref 3.5–5.1)
Sodium: 138 mmol/L (ref 135–145)

## 2019-02-28 LAB — BLOOD GAS, ARTERIAL
Acid-Base Excess: 16.6 mmol/L — ABNORMAL HIGH (ref 0.0–2.0)
Bicarbonate: 44.8 mmol/L — ABNORMAL HIGH (ref 20.0–28.0)
FIO2: 0.4
MECHVT: 500 mL
Mechanical Rate: 15
O2 Saturation: 88.2 %
PEEP: 5 cmH2O
Patient temperature: 37
pCO2 arterial: 69 mmHg (ref 32.0–48.0)
pH, Arterial: 7.42 (ref 7.350–7.450)
pO2, Arterial: 54 mmHg — ABNORMAL LOW (ref 83.0–108.0)

## 2019-02-28 LAB — CBC
HCT: 54.9 % — ABNORMAL HIGH (ref 36.0–46.0)
Hemoglobin: 17.4 g/dL — ABNORMAL HIGH (ref 12.0–15.0)
MCH: 29.7 pg (ref 26.0–34.0)
MCHC: 31.7 g/dL (ref 30.0–36.0)
MCV: 93.7 fL (ref 80.0–100.0)
Platelets: 207 10*3/uL (ref 150–400)
RBC: 5.86 MIL/uL — ABNORMAL HIGH (ref 3.87–5.11)
RDW: 14.8 % (ref 11.5–15.5)
WBC: 6.6 10*3/uL (ref 4.0–10.5)
nRBC: 0 % (ref 0.0–0.2)

## 2019-02-28 LAB — ECHOCARDIOGRAM COMPLETE
Height: 65 in
Weight: 3400.38 oz

## 2019-02-28 MED ORDER — IPRATROPIUM-ALBUTEROL 0.5-2.5 (3) MG/3ML IN SOLN
3.0000 mL | RESPIRATORY_TRACT | Status: DC
  Administered 2019-02-28 – 2019-03-03 (×15): 3 mL via RESPIRATORY_TRACT
  Filled 2019-02-28 (×16): qty 3

## 2019-02-28 MED ORDER — ALPRAZOLAM 0.5 MG PO TABS
0.5000 mg | ORAL_TABLET | Freq: Three times a day (TID) | ORAL | Status: DC | PRN
  Administered 2019-02-28 – 2019-03-02 (×5): 0.5 mg via ORAL
  Filled 2019-02-28 (×5): qty 1

## 2019-02-28 MED ORDER — ORAL CARE MOUTH RINSE
15.0000 mL | Freq: Two times a day (BID) | OROMUCOSAL | Status: DC
  Administered 2019-02-28 – 2019-03-06 (×9): 15 mL via OROMUCOSAL

## 2019-02-28 MED ORDER — FUROSEMIDE 10 MG/ML IJ SOLN
20.0000 mg | Freq: Two times a day (BID) | INTRAMUSCULAR | Status: DC
  Administered 2019-02-28 – 2019-03-01 (×2): 20 mg via INTRAVENOUS
  Filled 2019-02-28 (×2): qty 2

## 2019-02-28 MED ORDER — PERFLUTREN LIPID MICROSPHERE
1.0000 mL | INTRAVENOUS | Status: AC | PRN
  Administered 2019-02-28: 3 mL via INTRAVENOUS
  Filled 2019-02-28: qty 10

## 2019-02-28 MED ORDER — FAMOTIDINE 20 MG PO TABS
20.0000 mg | ORAL_TABLET | Freq: Two times a day (BID) | ORAL | Status: DC
  Administered 2019-02-28 – 2019-03-06 (×13): 20 mg via ORAL
  Filled 2019-02-28 (×13): qty 1

## 2019-02-28 MED ORDER — ENOXAPARIN SODIUM 40 MG/0.4ML ~~LOC~~ SOLN
40.0000 mg | SUBCUTANEOUS | Status: DC
  Administered 2019-02-28 – 2019-03-06 (×7): 40 mg via SUBCUTANEOUS
  Filled 2019-02-28 (×7): qty 0.4

## 2019-02-28 MED ORDER — BUDESONIDE 0.5 MG/2ML IN SUSP
0.5000 mg | Freq: Two times a day (BID) | RESPIRATORY_TRACT | Status: DC
  Administered 2019-02-28 – 2019-03-06 (×13): 0.5 mg via RESPIRATORY_TRACT
  Filled 2019-02-28 (×13): qty 2

## 2019-02-28 MED ORDER — METHYLPREDNISOLONE SODIUM SUCC 40 MG IJ SOLR
40.0000 mg | Freq: Three times a day (TID) | INTRAMUSCULAR | Status: DC
  Administered 2019-02-28 – 2019-03-03 (×9): 40 mg via INTRAVENOUS
  Filled 2019-02-28 (×9): qty 1

## 2019-02-28 MED ORDER — SODIUM CHLORIDE 0.9 % IV SOLN
3.0000 g | Freq: Four times a day (QID) | INTRAVENOUS | Status: DC
  Administered 2019-02-28 – 2019-03-04 (×16): 3 g via INTRAVENOUS
  Filled 2019-02-28: qty 3
  Filled 2019-02-28 (×3): qty 8
  Filled 2019-02-28: qty 3
  Filled 2019-02-28 (×2): qty 8
  Filled 2019-02-28 (×5): qty 3
  Filled 2019-02-28: qty 8
  Filled 2019-02-28: qty 3
  Filled 2019-02-28: qty 8
  Filled 2019-02-28 (×2): qty 3
  Filled 2019-02-28: qty 8

## 2019-02-28 MED ORDER — FAMOTIDINE IN NACL 20-0.9 MG/50ML-% IV SOLN
20.0000 mg | Freq: Two times a day (BID) | INTRAVENOUS | Status: DC
  Administered 2019-02-28: 20 mg via INTRAVENOUS
  Filled 2019-02-28: qty 50

## 2019-02-28 NOTE — Progress Notes (Signed)
CRITICAL CARE NOTE      CHIEF COMPLAINT:   Acute on chronic hypoxemic respiratory failure   SUBJECTIVE    Patient has been optimized for weaning trial.     Performed SBT  Patient passed spontaneous breathing trial and was successfully liberated off mechanical ventilation today    PAST MEDICAL HISTORY   Past Medical History:  Diagnosis Date  . Asthma   . CHF (congestive heart failure) ([place])   . COPD (chronic obstructive pulmonary disease) ([place])   . Diabetes mellitus without complication ([place])   . Hypertension   . Sleep apnea      SURGICAL HISTORY   Past Surgical History:  Procedure Laterality Date  . ABDOMINAL HYSTERECTOMY    . COLON SURGERY       FAMILY HISTORY   Family History  Adopted: Yes  Family history unknown: Yes     SOCIAL HISTORY   Social History   Tobacco Use  . Smoking status: Former Research scientist (life sciences)  . Smokeless tobacco: Never Used  Substance Use Topics  . Alcohol use: No  . Drug use: No     MEDICATIONS   Current Medication:  Current Facility-Administered Medications:  .  acetaminophen (TYLENOL) tablet 650 mg, 650 mg, Oral, Q6H PRN **OR** acetaminophen (TYLENOL) suppository 650 mg, 650 mg, Rectal, Q6H PRN, Mayo, Pete Pelt, MD .  Ampicillin-Sulbactam (UNASYN) 3 g in sodium chloride 0.9 % 100 mL IVPB, 3 g, Intravenous, Q6H, Charlett Nose, RPH .  chlorhexidine gluconate (MEDLINE KIT) (PERIDEX) 0.12 % solution 15 mL, 15 mL, Mouth Rinse, BID, Blakeney, Dreama Saa, NP, 15 mL at 02/28/19 0826 .  Chlorhexidine Gluconate Cloth 2 % PADS 6 each, 6 each, Topical, Daily, Ottie Glazier, MD, 6 each at 02/27/19 1846 .  DULoxetine (CYMBALTA) DR capsule 30 mg, 30 mg, Oral, Daily, Mayo, Pete Pelt, MD .  enoxaparin (LOVENOX) injection 40 mg, 40 mg, Subcutaneous, Q24H, Linton Stolp,  MD .  fentaNYL 2585mg in NS 2576m(1055mml) infusion-PREMIX, 0-400 mcg/hr, Intravenous, Continuous, Mayo, KatPete PeltD, Last Rate: 5 mL/hr at 02/28/19 0832, 50 mcg/hr at 02/28/19 0832 .  fluticasone furoate-vilanterol (BREO ELLIPTA) 200-25 MCG/INH 1 puff, 1 puff, Inhalation, Daily, Mayo, KatPete PeltD .  ipratropium-albuterol (DUONEB) 0.5-2.5 (3) MG/3ML nebulizer solution 3 mL, 3 mL, Nebulization, Q6H, Mayo, KatPete PeltD, 3 mL at 02/28/19 0741 .  MEDLINE mouth rinse, 15 mL, Mouth Rinse, 10 times per day, BlaAwilda BillP, 15 mL at 02/28/19 0949 .  methylPREDNISolone sodium succinate (SOLU-MEDROL) 125 mg/2 mL injection 60 mg, 60 mg, Intravenous, Q6H, Mayo, KatPete PeltD, 60 mg at 02/28/19 [id number] ondansetron (ZOFRAN) tablet 4 mg, 4 mg, Oral, Q6H PRN **OR** ondansetron (ZOFRAN) injection 4 mg, 4 mg, Intravenous, Q6H PRN, Mayo, KatPete PeltD .  polyethylene glycol (MIRALAX / GLYCOLAX) packet 17 g, 17 g, Oral, Daily PRN, Mayo, KatPete PeltD .  propofol (DIPRIVAN) 1000 MG/100ML infusion, 5-80 mcg/kg/min, Intravenous, Titrated, Mayo, KatPete PeltD, Stopped at 02/28/19 [id number] tobramycin-dexamethasone (TOBRADEX) ophthalmic ointment, , Both Eyes, BID, AleOttie GlazierD, 1 application at 02/21/71/6249 .  trimethoprim-polymyxin b (POLYTRIM) ophthalmic solution 1 drop, 1 drop, Both Eyes, Q4H, Mayo, KatPete PeltD, 1 drop at 02/28/19 0825  Facility-Administered Medications Ordered in Other Encounters:  .  perflutren lipid microspheres (DEFINITY) IV suspension, 1-10 mL, Intravenous, PRN, Mayo, KatPete PeltD, 3 mL at 02/28/19 0935    ALLERGIES   Patient has no known allergies.    REVIEW OF  SYSTEMS     Unable to obtain due to sedation on MV  PHYSICAL EXAMINATION   Vitals:   02/28/19 0830 02/28/19 0900  BP: 90/76 130/75  Pulse: 96 (!) 109  Resp: 18 18  Temp: 98.6 F (37 C) 98.8 F (37.1 C)  SpO2: 97% 95%    GENERAL:sedated on MV HEAD: Normocephalic, atraumatic.  EYES: Pupils  equal, round, reactive to light.  No scleral icterus.  MOUTH: Moist mucosal membrane. NECK: Supple. No thyromegaly. No nodules. No JVD.  PULMONARY: mild rhonchorous bs CARDIOVASCULAR: S1 and S2. Regular rate and rhythm. No murmurs, rubs, or gallops.  GASTROINTESTINAL: Soft, nontender, non-distended. No masses. Positive bowel sounds. No hepatosplenomegaly.  MUSCULOSKELETAL: No swelling, clubbing, or edema.  NEUROLOGIC: Mild distress due to acute illness SKIN:intact,warm,dry   PERTINENT DATA         Infusions: . ampicillin-sulbactam (UNASYN) IV    . fentaNYL infusion INTRAVENOUS 50 mcg/hr (02/28/19 0832)  . propofol (DIPRIVAN) infusion Stopped (02/28/19 3614)   Scheduled Medications: . chlorhexidine gluconate (MEDLINE KIT)  15 mL Mouth Rinse BID  . Chlorhexidine Gluconate Cloth  6 each Topical Daily  . DULoxetine  30 mg Oral Daily  . enoxaparin (LOVENOX) injection  40 mg Subcutaneous Q24H  . fluticasone furoate-vilanterol  1 puff Inhalation Daily  . ipratropium-albuterol  3 mL Nebulization Q6H  . mouth rinse  15 mL Mouth Rinse 10 times per day  . methylPREDNISolone (SOLU-MEDROL) injection  60 mg Intravenous Q6H  . tobramycin-dexamethasone   Both Eyes BID  . trimethoprim-polymyxin b  1 drop Both Eyes Q4H   PRN Medications:  Hemodynamic parameters:   Intake/Output: 09/21 0701 - 09/22 0700 In: 424.5 [I.V.:324.5; IV Piggyback:100] Out: 1050 [Urine:1000; Emesis/NG output:50]  Ventilator  Settings: Vent Mode: PRVC FiO2 (%):  [40 %-50 %] 50 % Set Rate:  [15 bmp-22 bmp] 15 bmp Vt Set:  [500 mL] 500 mL PEEP:  [5 cmH20] 5 cmH20    LAB RESULTS:  Basic Metabolic Panel: Recent Labs  Lab 02/27/19 1404 02/28/19 0514  NA 136 138  K 4.7 4.2  CL 89* 92*  CO2 37* 35*  GLUCOSE 110* 178*  BUN 28* 23*  CREATININE 0.86 0.91  CALCIUM 8.9 8.9  MG 2.3  --    Liver Function Tests: Recent Labs  Lab 02/27/19 1404  AST 27  ALT 48*  ALKPHOS 48  BILITOT 0.8  PROT 6.3*   ALBUMIN 3.6   No results for input(s): LIPASE, AMYLASE in the last 168 hours. No results for input(s): AMMONIA in the last 168 hours. CBC: Recent Labs  Lab 02/27/19 1404 02/28/19 0514  WBC 9.9 6.6  NEUTROABS 7.3  --   HGB 16.4* 17.4*  HCT 53.2* 54.9*  MCV 95.3 93.7  PLT 216 207   Cardiac Enzymes: No results for input(s): CKTOTAL, CKMB, CKMBINDEX, TROPONINI in the last 168 hours. BNP: Invalid input(s): POCBNP CBG: Recent Labs  Lab 02/27/19 1625 02/27/19 1844  GLUCAP 143* 135*     IMAGING RESULTS:  Imaging: Dg Chest 1 View  Result Date: 02/27/2019 CLINICAL DATA:  Shortness of breath EXAM: CHEST  1 VIEW COMPARISON:  February 04, 2019 FINDINGS: There is no edema or consolidation. Heart is mildly enlarged with mild pulmonary venous hypertension. No adenopathy. There is aortic atherosclerosis. No bone lesions. IMPRESSION: Mild cardiac enlargement with pulmonary vascular congestion. No edema or consolidation. Aortic Atherosclerosis (ICD10-I70.0). Electronically Signed   By: Lowella Grip III M.D.   On: 02/27/2019 14:18   Ct Head Wo  Contrast  Result Date: 02/27/2019 CLINICAL DATA:  Altered level of consciousness EXAM: CT HEAD WITHOUT CONTRAST TECHNIQUE: Contiguous axial images were obtained from the base of the skull through the vertex without intravenous contrast. COMPARISON:  12/12/2017 FINDINGS: Brain: No acute intracranial abnormality. Specifically, no hemorrhage, hydrocephalus, mass lesion, acute infarction, or significant intracranial injury. Vascular: No hyperdense vessel or unexpected calcification. Skull: No acute calvarial abnormality. Sinuses/Orbits: Visualized paranasal sinuses and mastoids clear. Orbital soft tissues unremarkable. Other: None IMPRESSION: Normal study. Electronically Signed   By: Rolm Baptise M.D.   On: 02/27/2019 18:01   Dg Chest Portable 1 View  Result Date: 02/27/2019 CLINICAL DATA:  Respiratory failure EXAM: PORTABLE CHEST 1 VIEW COMPARISON:   Same-day radiograph FINDINGS: Interval placement of endotracheal tube with distal tip terminating approximately 3.8 cm superior to the carina. Enteric tube courses below the diaphragm with distal tip beyond the inferior margin of the film. Stable cardiomediastinal contours. Calcific aortic knob. Chronic mild pulmonary vascular congestion. Linear bibasilar opacities, favor atelectasis. No focal airspace consolidation, pleural effusion, or pneumothorax. IMPRESSION: Satisfactory positioning of endotracheal and enteric tubes. Electronically Signed   By: Davina Poke M.D.   On: 02/27/2019 17:12   Dg Chest Portable 1 View  Result Date: 02/27/2019 CLINICAL DATA:  Worsening shortness of breath for 1 week. History of asthma, congestive heart failure and diabetes. EXAM: PORTABLE CHEST 1 VIEW COMPARISON:  Radiographs 02/27/2019 and 02/04/2019. FINDINGS: 1614 hours. Lower lung volumes with resulting bibasilar atelectasis. There is no confluent airspace opacity, pleural effusion, edema or pneumothorax. There is mild vascular congestion. The heart size and mediastinal contours are stable with aortic atherosclerosis. The bones appear unchanged. IMPRESSION: Lower lung volumes with resulting mild bibasilar atelectasis. Otherwise stable chest with chronic pulmonary vascular congestion. Electronically Signed   By: Richardean Sale M.D.   On: 02/27/2019 16:39      ASSESSMENT AND PLAN    -Multidisciplinary rounds held today  Acute on chronic Hypoxic and hypercarbic Respiratory Failure -Unasyn for possible aspiration due to RLL infiltrate -patient had been intubated for AECOPD in our MICU in past -swallow evaluation and initiate diet as appropriate    Possible drug overdose / suicidal attempt    - UDS pending  - patient recently lost mother    - will need official psychiatric evaluation for SI post extubation     Advanced COPD -continue DuoNEbs -Solumedrol 40 q12 - 8 PEEP on MV to recruit atelectatic  segments - most recent PFT with severe obstructive ventilatory defect in 2019    Keratoconjuctivitis of right eye tobradex q12h BID x 3 days for now   ID -continue IV abx as prescibed -follow up cultures  GI/Nutrition GI PROPHYLAXIS as indicated DIET-->TF's as tolerated Constipation protocol as indicated  ENDO - ICU hypoglycemic\Hyperglycemia protocol -check FSBS per protocol   ELECTROLYTES -follow labs as needed -replace as needed -pharmacy consultation   DVT/GI PRX ordered -SCDs  TRANSFUSIONS AS NEEDED MONITOR FSBS ASSESS the need for LABS as needed   Critical care provider statement:    Critical care time (minutes):  36   Critical care time was exclusive of:  Separately billable procedures and treating other patients   Critical care was necessary to treat or prevent imminent or life-threatening deterioration of the following conditions:  Acute on chronic hypoxemic/hypercapnic resp failure, advanced COPD, multiple comorbid conditions.    Critical care was time spent personally by me on the following activities:  Development of treatment plan with patient or surrogate, discussions with consultants, evaluation  of patient's response to treatment, examination of patient, obtaining history from patient or surrogate, ordering and performing treatments and interventions, ordering and review of laboratory studies and re-evaluation of patient's condition.  I assumed direction of critical care for this patient from another provider in my specialty: no    This document was prepared using Dragon voice recognition software and may include unintentional dictation errors.    Ottie Glazier, M.D.  Division of [facility]

## 2019-02-28 NOTE — Evaluation (Signed)
Physical Therapy Evaluation Patient Details Name: Shelly [name] MRN: [id number] DOB: 12/24/1960 Today's Date: 02/28/2019   History of Present Illness  58 y/o female patient came in with increasing shortness of breath. She was found to be hypoxic with hypercapnia intubated placed on the vent. Extubated AM 9/22.  Clinical Impression  Pt eager and willing to work with PT despite feeling relatively poorly. She showed good mobility and confidence with in-bed activities. Her O2 did drop a little in getting to sitting (high 80s on 6L) but recovered to low/mid 90s relatively quickly.  Pt normally does not use/need AD, though she was reliant on the walker in standing (tolerated very low amplitude/frequency) marching in place with single HHA but was not steady with this.  Pt feeling confident about ability to go home once swelling and breathing improve.  She will benefit from HHPT and DME (has RW available, will need tub bench).  Pt will benefit from continued PT services, has been having outpt PT for chronic neck pain, issued and educated on gentle stretches while here in the hospital.      Follow Up Recommendations Home health PT;Supervision - Intermittent    Equipment Recommendations  Other (comment)(tub bench (has wide jacuzzi-style tub) )    Recommendations for Other Services       Precautions / Restrictions Precautions Precautions: Fall Restrictions Weight Bearing Restrictions: No      Mobility  Bed Mobility Overal bed mobility: Independent             General bed mobility comments: Pt able to don b/l socks w/o assist in bed, able to get to sitting EOB w/o assist  Transfers Overall transfer level: Needs assistance Equipment used: Rolling walker (2 wheeled) Transfers: Sit to/from Stand Sit to Stand: Min guard         General transfer comment: pt struggled to rise w/o cuing, once we reset and PT educated on positioning and UE use she was able to slowly rise with only CGA and  VCs.  Ambulation/Gait Ambulation/Gait assistance: Min guard Gait Distance (Feet): 5 Feet Assistive device: Rolling walker (2 wheeled)       General Gait Details: Pt able to stand and take a few steps from bed to recliner.  Pt's O2 did drop into the high 80's with activity on 6L, and HR increased 100s to 120s.  Pt did not endorse excessive fatigue and despite feeling much weaker than her normal she was able to safely do this minimal bout of mobility  Stairs            Wheelchair Mobility    Modified Rankin (Stroke Patients Only)       Balance Overall balance assessment: Needs assistance   Sitting balance-Leahy Scale: Good       Standing balance-Leahy Scale: Fair                               Pertinent Vitals/Pain Pain Assessment: (chronic L neck pain (issued/educated gentle stretch HEP))    Home Living Family/patient expects to be discharged to:: Private residence Living Arrangements: Spouse/significant other Available Help at Discharge: Family   Home Access: Stairs to enter   Technical brewer of Steps: 4 Home Layout: One level Home Equipment: Grab bars - tub/shower(has access to RW)      Prior Function Level of Independence: Independent         Comments: Pt has struggled the last month, but generally runs  errands, manages the home, etc w/o need for AD, infrequent use of O2 at night     Hand Dominance        Extremity/Trunk Assessment   Upper Extremity Assessment Upper Extremity Assessment: Overall WFL for tasks assessed;Generalized weakness    Lower Extremity Assessment Lower Extremity Assessment: Overall WFL for tasks assessed;Generalized weakness       Communication   Communication: No difficulties  Cognition Arousal/Alertness: Awake/alert Behavior During Therapy: WFL for tasks assessed/performed Overall Cognitive Status: Within Functional Limits for tasks assessed                                         General Comments      Exercises     Assessment/Plan    PT Assessment Patient needs continued PT services  PT Problem List Decreased strength;Decreased range of motion;Decreased activity tolerance;Decreased balance;Decreased mobility;Decreased knowledge of use of DME;Decreased safety awareness;Cardiopulmonary status limiting activity       PT Treatment Interventions DME instruction;Gait training;Stair training;Functional mobility training;Therapeutic activities;Therapeutic exercise;Balance training;Neuromuscular re-education;Patient/family education    PT Goals (Current goals can be found in the Care Plan section)  Acute Rehab PT Goals Patient Stated Goal: go home PT Goal Formulation: With patient Time For Goal Achievement: 03/14/19 Potential to Achieve Goals: Good    Frequency Min 2X/week   Barriers to discharge        Co-evaluation               AM-PAC PT "6 Clicks" Mobility  Outcome Measure Help needed turning from your back to your side while in a flat bed without using bedrails?: None Help needed moving from lying on your back to sitting on the side of a flat bed without using bedrails?: None Help needed moving to and from a bed to a chair (including a wheelchair)?: A Little Help needed standing up from a chair using your arms (e.g., wheelchair or bedside chair)?: A Little Help needed to walk in hospital room?: A Little Help needed climbing 3-5 steps with a railing? : A Lot 6 Click Score: 19    End of Session Equipment Utilized During Treatment: Gait belt Activity Tolerance: Patient limited by fatigue Patient left: in chair;with call bell/phone within reach Nurse Communication: Mobility status(vitals response to activity) PT Visit Diagnosis: Muscle weakness (generalized) (M62.81);Difficulty in walking, not elsewhere classified (R26.2)    Time: [id number] PT Time Calculation (min) (ACUTE ONLY): 46 min   Charges:   PT Evaluation $PT Eval Moderate  Complexity: 1 Mod PT Treatments $Therapeutic Exercise: 8-22 mins $Therapeutic Activity: 8-22 mins        Kreg Shropshire, DPT 02/28/2019, 4:11 PM

## 2019-02-28 NOTE — Progress Notes (Signed)
PHARMACIST - PHYSICIAN COMMUNICATION  CONCERNING: IV to Oral Route Change Policy  RECOMMENDATION: This patient is receiving famotidine by the intravenous route.  Based on criteria approved by the Pharmacy and Therapeutics Committee, the intravenous medication(s) is/are being converted to the equivalent oral dose form(s).   DESCRIPTION: These criteria include:  The patient is eating (either orally or via tube) and/or has been taking other orally administered medications for a least 24 hours  The patient has no evidence of active gastrointestinal bleeding or impaired GI absorption (gastrectomy, short bowel, patient on TNA or NPO).  If you have questions about this conversion, please contact the Pharmacy Department at [phone].  Shelly [name] L, [facility] 02/28/2019 4:28 PM

## 2019-02-28 NOTE — Progress Notes (Signed)
*  PRELIMINARY RESULTS* Echocardiogram 2D Echocardiogram has been performed.  Shelly [name] 02/28/2019, 9:36 AM

## 2019-02-28 NOTE — Consult Note (Signed)
Pharmacy Antibiotic Note  Shelly [name] is a 58 y.o. female admitted on 02/27/2019 with Pneumonia. Patient with history of CHF, COPD on chronic O2, hypertension and diabetes presents with worsening SOB and hypoxia. CXR significant for pulmonary vascular congestion. BNP elevated and PCT <0.10. Patient with persistent AMS on BiPAP and was subsequently intubated. Pharmacy has been consulted for Unasyn dosing.   Today patient was extubated to 4L nasal cannula. No leukocytosis, no left shift on differential, no fevers.   Plan: Unasyn 3 g q6h IV  Height: 5\' 5"  (165.1 cm) Weight: 212 lb 8.4 oz (96.4 kg) IBW/kg (Calculated) : 57  Temp (24hrs), Avg:99 F (37.2 C), Min:98.2 F (36.8 C), Max:99.9 F (37.7 C)  Recent Labs  Lab 02/27/19 1404 02/28/19 0514  WBC 9.9 6.6  CREATININE 0.86 0.91    Estimated Creatinine Clearance: 79.3 mL/min (by C-G formula based on SCr of 0.91 mg/dL).    No Known Allergies  Antimicrobials this admission: Unasyn 9/21 >>    Dose adjustments this admission: 9/22: Patient was initiated on Unasyn 1.5 g q12h. Based on indication and renal function patient is a candidate for Unasyn 3 g q6h.   Microbiology results: 9/21 MRSA PCR: negative  Thank you for allowing pharmacy to be a part of this patient's care.  [place] Resident 02/28/2019 2:47 PM

## 2019-02-28 NOTE — Progress Notes (Signed)
Extubated without complications to 4lnc 

## 2019-02-28 NOTE — Progress Notes (Signed)
Shelly [name] at [place] NAME: Shelly [name]    MR#:  [id number]  DATE OF BIRTH:  1960/10/06  SUBJECTIVE:  patient came in with increasing shortness of breath. She was found to be hypoxic with hypercapnia intubated placed on the ventilator overnight. Currently extubated. Has some horse voice. Complains of leg swelling. She did report doing marijuana.  denies any chest pain. REVIEW OF SYSTEMS:   Review of Systems  Constitutional: Negative for chills, fever and weight loss.  HENT: Negative for ear discharge, ear pain and nosebleeds.   Eyes: Negative for blurred vision, pain and discharge.  Respiratory: Positive for cough and shortness of breath. Negative for sputum production, wheezing and stridor.   Cardiovascular: Positive for orthopnea and leg swelling. Negative for chest pain, palpitations and PND.  Gastrointestinal: Negative for abdominal pain, diarrhea, nausea and vomiting.  Genitourinary: Negative for frequency and urgency.  Musculoskeletal: Negative for back pain and joint pain.  Neurological: Positive for weakness. Negative for sensory change, speech change and focal weakness.  Psychiatric/Behavioral: Negative for depression and hallucinations. The patient is not nervous/anxious.    Tolerating Diet:yes Tolerating PT: pending  DRUG ALLERGIES:  No Known Allergies  VITALS:  Blood pressure 117/68, pulse (!) 113, temperature 99.1 F (37.3 C), resp. rate 16, height 5\' 5"  (1.651 m), weight 96.4 kg, SpO2 93 %.  PHYSICAL EXAMINATION:   Physical Exam  GENERAL:  58 y.o.-year-old patient lying in the bed with no acute distress. Obese EYES: Pupils equal, round, reactive to light and accommodation. No scleral icterus. Extraocular muscles intact.  HEENT: Head atraumatic, normocephalic. Oropharynx and nasopharynx clear.  NECK:  Supple, no jugular venous distention. No thyroid enlargement, no tenderness.  LUNGS: coarse breath sounds  bilaterally, no wheezing, rales,scattered rhonchi. No use of accessory muscles of respiration.  CARDIOVASCULAR: S1, S2 normal. No murmurs, rubs, or gallops.  ABDOMEN: Soft, nontender, nondistended. Bowel sounds present. No organomegaly or mass.  EXTREMITIES: No cyanosis, clubbing  ++ edema b/l.    NEUROLOGIC: Cranial nerves II through XII are intact. No focal Motor or sensory deficits b/l.   PSYCHIATRIC:  patient is alert and oriented x 3.  SKIN: No obvious rash, lesion, or ulcer.   LABORATORY PANEL:  CBC Recent Labs  Lab 02/28/19 0514  WBC 6.6  HGB 17.4*  HCT 54.9*  PLT 207    Chemistries  Recent Labs  Lab 02/27/19 1404 02/28/19 0514  NA 136 138  K 4.7 4.2  CL 89* 92*  CO2 37* 35*  GLUCOSE 110* 178*  BUN 28* 23*  CREATININE 0.86 0.91  CALCIUM 8.9 8.9  MG 2.3  --   AST 27  --   ALT 48*  --   ALKPHOS 48  --   BILITOT 0.8  --    Cardiac Enzymes No results for input(s): TROPONINI in the last 168 hours. RADIOLOGY:  Dg Chest 1 View  Result Date: 02/27/2019 CLINICAL DATA:  Shortness of breath EXAM: CHEST  1 VIEW COMPARISON:  February 04, 2019 FINDINGS: There is no edema or consolidation. Heart is mildly enlarged with mild pulmonary venous hypertension. No adenopathy. There is aortic atherosclerosis. No bone lesions. IMPRESSION: Mild cardiac enlargement with pulmonary vascular congestion. No edema or consolidation. Aortic Atherosclerosis (ICD10-I70.0). Electronically Signed   By: Shelly [name] M.D.   On: 02/27/2019 14:18   Ct Head Wo Contrast  Result Date: 02/27/2019 CLINICAL DATA:  Altered level of consciousness EXAM: CT HEAD WITHOUT CONTRAST TECHNIQUE: Contiguous axial  images were obtained from the base of the skull through the vertex without intravenous contrast. COMPARISON:  12/12/2017 FINDINGS: Brain: No acute intracranial abnormality. Specifically, no hemorrhage, hydrocephalus, mass lesion, acute infarction, or significant intracranial injury. Vascular: No  hyperdense vessel or unexpected calcification. Skull: No acute calvarial abnormality. Sinuses/Orbits: Visualized paranasal sinuses and mastoids clear. Orbital soft tissues unremarkable. Other: None IMPRESSION: Normal study. Electronically Signed   By: Shelly [name] M.D.   On: 02/27/2019 18:01   Dg Chest Portable 1 View  Result Date: 02/27/2019 CLINICAL DATA:  Respiratory failure EXAM: PORTABLE CHEST 1 VIEW COMPARISON:  Same-day radiograph FINDINGS: Interval placement of endotracheal tube with distal tip terminating approximately 3.8 cm superior to the carina. Enteric tube courses below the diaphragm with distal tip beyond the inferior margin of the film. Stable cardiomediastinal contours. Calcific aortic knob. Chronic mild pulmonary vascular congestion. Linear bibasilar opacities, favor atelectasis. No focal airspace consolidation, pleural effusion, or pneumothorax. IMPRESSION: Satisfactory positioning of endotracheal and enteric tubes. Electronically Signed   By: Shelly [name] M.D.   On: 02/27/2019 17:12   Dg Chest Portable 1 View  Result Date: 02/27/2019 CLINICAL DATA:  Worsening shortness of breath for 1 week. History of asthma, congestive heart failure and diabetes. EXAM: PORTABLE CHEST 1 VIEW COMPARISON:  Radiographs 02/27/2019 and 02/04/2019. FINDINGS: 1614 hours. Lower lung volumes with resulting bibasilar atelectasis. There is no confluent airspace opacity, pleural effusion, edema or pneumothorax. There is mild vascular congestion. The heart size and mediastinal contours are stable with aortic atherosclerosis. The bones appear unchanged. IMPRESSION: Lower lung volumes with resulting mild bibasilar atelectasis. Otherwise stable chest with chronic pulmonary vascular congestion. Electronically Signed   By: Shelly Sale M.D.   On: 02/27/2019 16:39   ASSESSMENT AND PLAN:  Shelly [name]  is a 58 y.o. female with a known history of chronic respiratory failure secondary to COPD on 3 L O2 at baseline,  type 2 diabetes, hypertension, OSA, CHF who presented to the ED with shortness of breath over the last week.  *Altered mental status-  due to hypercarbia/hypoxia - VBG with pCO2 91. Intubated due to an inability to protect her airway. -CT head negative -UDS positive for cannabinoid -patient is alert and oriented times three. She is extubated.  *Acute on chronic hypoxic and hypercapneic respiratory failure likely due to acute exacerbation of COPD- patient uses 3L O2 at baseline. -COVID test was negative -Vent management per CCM-- currently extubated -Start IV solumedrol -Duonebs every 6 hours -Lasix IV BID 20 mg -Continue home inhalers  *Bacterial conjunctivitis of right eye -Trimethoprim-polymyxin drops every 4 hours  *acute on chronic chronic diastolic CHF- -patient has bilateral lower extremity edema with  CXR showed some pulmonary vascular congestion -Will give a dose of IV lasix -BNP 1900  and ECHO shows EF of 55 to 60%  *Hypertension- BP elevated in the ED -Continue home lisinopril  CODE STATUS: full  DVT Prophylaxis: enoxaparin  TOTAL TIME TAKING CARE OF THIS PATIENT: *30* minutes.  >50% time spent on counselling and coordination of care  POSSIBLE D/C IN *2-3* DAYS, DEPENDING ON CLINICAL CONDITION.  Note: This dictation was prepared with Dragon dictation along with smaller phrase technology. Any transcriptional errors that result from this process are unintentional.  Fritzi Mandes M.D on 02/28/2019 at 3:00 PM  Between 7am to 6pm - Pager - [phone]  After 6pm go to [email or web address] - password [email or web address] [facility]  Office  [phone]  CC: Primary care physician; [name], NPPatient ID: Shelly [name]  Charolette Child, female   DOB: 10-Nov-1960, 33 y.o.   MRN: [id number]

## 2019-03-01 ENCOUNTER — Encounter: Payer: Self-pay | Admitting: *Deleted

## 2019-03-01 LAB — BASIC METABOLIC PANEL
Anion gap: 9 (ref 5–15)
BUN: 25 mg/dL — ABNORMAL HIGH (ref 6–20)
CO2: 37 mmol/L — ABNORMAL HIGH (ref 22–32)
Calcium: 8.9 mg/dL (ref 8.9–10.3)
Chloride: 92 mmol/L — ABNORMAL LOW (ref 98–111)
Creatinine, Ser: 0.74 mg/dL (ref 0.44–1.00)
GFR calc Af Amer: >60 mL/min (ref >60)
GFR calc non Af Amer: >60 mL/min (ref >60)
Glucose, Bld: 154 mg/dL — ABNORMAL HIGH (ref 70–99)
Potassium: 4.8 mmol/L (ref 3.5–5.1)
Sodium: 138 mmol/L (ref 135–145)

## 2019-03-01 LAB — CBC
HCT: 50.7 % — ABNORMAL HIGH (ref 36.0–46.0)
Hemoglobin: 15.4 g/dL — ABNORMAL HIGH (ref 12.0–15.0)
MCH: 28.7 pg (ref 26.0–34.0)
MCHC: 30.4 g/dL (ref 30.0–36.0)
MCV: 94.6 fL (ref 80.0–100.0)
Platelets: 173 10*3/uL (ref 150–400)
RBC: 5.36 MIL/uL — ABNORMAL HIGH (ref 3.87–5.11)
RDW: 15.1 % (ref 11.5–15.5)
WBC: 10.6 10*3/uL — ABNORMAL HIGH (ref 4.0–10.5)
nRBC: 0 % (ref 0.0–0.2)

## 2019-03-01 LAB — HIV ANTIBODY (ROUTINE TESTING W REFLEX): HIV Screen 4th Generation wRfx: NONREACTIVE

## 2019-03-01 MED ORDER — FUROSEMIDE 10 MG/ML IJ SOLN
40.0000 mg | Freq: Once | INTRAMUSCULAR | Status: AC
  Administered 2019-03-01: 16:00:00 40 mg via INTRAVENOUS
  Filled 2019-03-01: qty 4

## 2019-03-01 MED ORDER — CALCIUM CARBONATE ANTACID 500 MG PO CHEW
1.0000 | CHEWABLE_TABLET | Freq: Three times a day (TID) | ORAL | Status: DC | PRN
  Administered 2019-03-01 – 2019-03-05 (×2): 200 mg via ORAL
  Filled 2019-03-01 (×2): qty 1

## 2019-03-01 MED ORDER — BACLOFEN 10 MG PO TABS
10.0000 mg | ORAL_TABLET | Freq: Three times a day (TID) | ORAL | Status: DC
  Administered 2019-03-01 – 2019-03-06 (×18): 10 mg via ORAL
  Filled 2019-03-01 (×18): qty 1

## 2019-03-01 MED ORDER — INFLUENZA VAC SPLIT QUAD 0.5 ML IM SUSY
0.5000 mL | PREFILLED_SYRINGE | INTRAMUSCULAR | Status: AC
  Administered 2019-03-03: 0.5 mL via INTRAMUSCULAR
  Filled 2019-03-01: qty 0.5

## 2019-03-01 MED ORDER — FUROSEMIDE 10 MG/ML IJ SOLN
20.0000 mg | Freq: Two times a day (BID) | INTRAMUSCULAR | Status: DC
  Administered 2019-03-02 – 2019-03-04 (×5): 20 mg via INTRAVENOUS
  Filled 2019-03-01 (×5): qty 2

## 2019-03-01 MED ORDER — KETOROLAC TROMETHAMINE 15 MG/ML IJ SOLN
15.0000 mg | Freq: Once | INTRAMUSCULAR | Status: AC
  Administered 2019-03-01: 01:00:00 15 mg via INTRAVENOUS
  Filled 2019-03-01: qty 1

## 2019-03-01 MED ORDER — ALUM & MAG HYDROXIDE-SIMETH 200-200-20 MG/5ML PO SUSP
30.0000 mL | ORAL | Status: DC | PRN
  Administered 2019-03-01 – 2019-03-05 (×3): 30 mL via ORAL
  Filled 2019-03-01 (×3): qty 30

## 2019-03-01 NOTE — Progress Notes (Signed)
Physical Therapy Treatment Patient Details Name: Shelly [name] MRN: [id number] DOB: July 28, 1960 Today's Date: 03/01/2019    History of Present Illness 58 y/o female patient came in with increasing shortness of breath. She was found to be hypoxic with hypercapnia intubated placed on the vent. Extubated AM 9/22.    PT Comments    Patient sleeping at start of session, easily woken. Reported improvement in stomach/chest pain compared to earlier in the day and agreeable to PT. Pt on 4.5L via HFNC initially, placed on 5L for mobility. Pt demonstrated bed mobility independently and was able to sit EOB for several minutes. Pt instructed in PLB to address mild de-saturation to mid 80s with transfers and exercises, able to recover with time or rest breaks. The patient was able to stand for several minutes with CGA and RW, and participate in standing marching after a rest break. Pt in chair and RN in room at end of session. Overall the patient demonstrated progression towards goals and would benefit from further skilled PT intervention.     Follow Up Recommendations  Home health PT;Supervision - Intermittent     Equipment Recommendations  Other (comment)(tub bench)    Recommendations for Other Services       Precautions / Restrictions Precautions Precautions: Fall Restrictions Weight Bearing Restrictions: No    Mobility  Bed Mobility Overal bed mobility: Independent                Transfers Overall transfer level: Needs assistance Equipment used: Rolling walker (2 wheeled) Transfers: Sit to/from Stand Sit to Stand: Min guard         General transfer comment: Pt transferred with extended time and heavy use of UE  Ambulation/Gait Ambulation/Gait assistance: Min guard Gait Distance (Feet): 3 Feet Assistive device: Rolling walker (2 wheeled)       General Gait Details: on 5L via HFNC, mild de-sat to mid 80s with activities, able to recover with PLB   Stairs              Wheelchair Mobility    Modified Rankin (Stroke Patients Only)       Balance Overall balance assessment: Needs assistance Sitting-balance support: Feet supported;Bilateral upper extremity supported Sitting balance-Leahy Scale: Good       Standing balance-Leahy Scale: Fair                              Cognition Arousal/Alertness: Awake/alert Behavior During Therapy: WFL for tasks assessed/performed Overall Cognitive Status: Within Functional Limits for tasks assessed                                        Exercises Other Exercises Other Exercises: Pt able to transfer to recliner with RW and CGA, stood 3-4 minutes with UE support to allow RN to clean patient. cues for PLB Other Exercises: Pt performed standing marching in place with RW and CGA, able to march ~30seconds, attempted standing rest break but de-satted to mid 80s, unable to recover until seated and resting.    General Comments        Pertinent Vitals/Pain Pain Assessment: (Pt endorsed bilateral foot pain with transfers/weight bearing. RN aware)    Home Living                      Prior Function  PT Goals (current goals can now be found in the care plan section) Progress towards PT goals: Progressing toward goals    Frequency    Min 2X/week      PT Plan Current plan remains appropriate    Co-evaluation              AM-PAC PT "6 Clicks" Mobility   Outcome Measure  Help needed turning from your back to your side while in a flat bed without using bedrails?: None Help needed moving from lying on your back to sitting on the side of a flat bed without using bedrails?: None Help needed moving to and from a bed to a chair (including a wheelchair)?: A Little   Help needed to walk in hospital room?: A Little Help needed climbing 3-5 steps with a railing? : A Lot 6 Click Score: 16    End of Session Equipment Utilized During Treatment: Gait  belt Activity Tolerance: Patient limited by fatigue Patient left: in chair;with nursing/sitter in room Nurse Communication: Mobility status PT Visit Diagnosis: Muscle weakness (generalized) (M62.81);Difficulty in walking, not elsewhere classified (R26.2)     Time: [id number] PT Time Calculation (min) (ACUTE ONLY): 24 min  Charges:  $Therapeutic Exercise: 23-37 mins                     Lieutenant Diego PT, DPT 2:41 PM,03/01/19 [phone]

## 2019-03-01 NOTE — Progress Notes (Signed)
[place] [facility] [place] NAME: Shelly [name]    MR#:  [id number]  DATE OF BIRTH:  03/19/1961  SUBJECTIVE:  patient came in with increasing shortness of breath. She was found to be hypoxic with hypercapnia intubated placed on the ventilator overnight. remains extubated. On N.C.  Asks - why she is feeling this way, eyes red, coughing + REVIEW OF SYSTEMS:   Review of Systems  Constitutional: Negative for chills, fever and weight loss.  HENT: Negative for ear discharge, ear pain and nosebleeds.   Eyes: Negative for blurred vision, pain and discharge.  Respiratory: Positive for cough and shortness of breath. Negative for sputum production, wheezing and stridor.   Cardiovascular: Positive for orthopnea and leg swelling. Negative for chest pain, palpitations and PND.  Gastrointestinal: Negative for abdominal pain, diarrhea, nausea and vomiting.  Genitourinary: Negative for frequency and urgency.  Musculoskeletal: Negative for back pain and joint pain.  Neurological: Positive for weakness. Negative for sensory change, speech change and focal weakness.  Psychiatric/Behavioral: Negative for depression and hallucinations. The patient is not nervous/anxious.    Tolerating Diet:yes Tolerating PT: pending  DRUG ALLERGIES:  No Known Allergies  VITALS:  Blood pressure 111/87, pulse 67, temperature 98.4 F (36.9 C), resp. rate 19, height 5\' 5"  (1.651 m), weight 96.4 kg, SpO2 92 %.  PHYSICAL EXAMINATION:   Physical Exam  GENERAL:  58 y.o.-year-old patient lying in the bed with no acute distress. Obese EYES: Pupils equal, round, reactive to light and accommodation. No scleral icterus. Extraocular muscles intact.  HEENT: Head atraumatic, normocephalic. Oropharynx and nasopharynx clear.  NECK:  Supple, no jugular venous distention. No thyroid enlargement, no tenderness.  LUNGS: coarse breath sounds bilaterally, no wheezing, rales,scattered rhonchi. No  use of accessory muscles of respiration.  CARDIOVASCULAR: S1, S2 normal. No murmurs, rubs, or gallops.  ABDOMEN: Soft, nontender, nondistended. Bowel sounds present. No organomegaly or mass.  EXTREMITIES: No cyanosis, clubbing  ++ edema b/l.    NEUROLOGIC: Cranial nerves II through XII are intact. No focal Motor or sensory deficits b/l.   PSYCHIATRIC:  patient is alert and oriented x 3.  SKIN: No obvious rash, lesion, or ulcer.   LABORATORY PANEL:  CBC Recent Labs  Lab 03/01/19 0559  WBC 10.6*  HGB 15.4*  HCT 50.7*  PLT 173    Chemistries  Recent Labs  Lab 02/27/19 1404  03/01/19 0559  NA 136   < > 138  K 4.7   < > 4.8  CL 89*   < > 92*  CO2 37*   < > 37*  GLUCOSE 110*   < > 154*  BUN 28*   < > 25*  CREATININE 0.86   < > 0.74  CALCIUM 8.9   < > 8.9  MG 2.3  --   --   AST 27  --   --   ALT 48*  --   --   ALKPHOS 48  --   --   BILITOT 0.8  --   --    < > = values in this interval not displayed.   Cardiac Enzymes No results for input(s): TROPONINI in the last 168 hours. RADIOLOGY:  Ct Head Wo Contrast  Result Date: 02/27/2019 CLINICAL DATA:  Altered level of consciousness EXAM: CT HEAD WITHOUT CONTRAST TECHNIQUE: Contiguous axial images were obtained from the base of the skull through the vertex without intravenous contrast. COMPARISON:  12/12/2017 FINDINGS: Brain: No acute intracranial abnormality. Specifically, no  hemorrhage, hydrocephalus, mass lesion, acute infarction, or significant intracranial injury. Vascular: No hyperdense vessel or unexpected calcification. Skull: No acute calvarial abnormality. Sinuses/Orbits: Visualized paranasal sinuses and mastoids clear. Orbital soft tissues unremarkable. Other: None IMPRESSION: Normal study. Electronically Signed   By: Rolm Baptise M.D.   On: 02/27/2019 18:01   ASSESSMENT AND PLAN:  Shelly [name]  is a 58 y.o. female with a known history of chronic respiratory failure secondary to COPD on 3 L O2 at baseline, type 2  diabetes, hypertension, OSA, CHF who presented to the ED with shortness of breath over the last week.  *Altered mental status-  due to hypercarbia/hypoxia - VBG with pCO2 91. Intubated due to an inability to protect her airway. -CT head negative -UDS positive for cannabinoid -patient is alert and oriented times three. remains extubated. On N.C.  *Acute on chronic hypoxic and hypercapneic respiratory failure likely due to acute exacerbation of COPD- patient uses 3L O2 at baseline. -COVID test was negative -continue IV solumedrol -Duonebs every 6 hours -Lasix IV BID 20 mg -Continue home inhalers - Unasyn for possible aspiration due to RLL infiltrate -swallow evaluation and initiate diet as appropriate  *Bacterial conjunctivitis of right eye -Trimethoprim-polymyxin drops every 4 hours  * Acute on chronic chronic diastolic CHF- -patient has bilateral lower extremity edema with  CXR showed some pulmonary vascular congestion -ECHO shows EF of 55 to 60%  *Hypertension- BP elevated in the ED -Continue home lisinopril  CODE STATUS: full  DVT Prophylaxis: enoxaparin  TOTAL TIME TAKING CARE OF THIS PATIENT: 15 minutes.  >50% time spent on counselling and coordination of care  POSSIBLE D/C IN *2-3* DAYS, DEPENDING ON CLINICAL CONDITION.  Note: This dictation was prepared with Dragon dictation along with smaller phrase technology. Any transcriptional errors that result from this process are unintentional.  Max Sane M.D on 03/01/2019 at 5:49 PM  Between 7am to 6pm - Pager - [phone]  After 6pm go to [email or web address] - password [email or web address] [facility]  Office  [phone]  CC: Primary care physician; Shelly [name], NPPatient ID: Shelly [name], female   DOB: May 09, 1961, 58 y.o.   MRN: [id number]

## 2019-03-01 NOTE — Progress Notes (Signed)
PT Cancellation Note  Patient Details Name: Shelly [name] MRN: [id number] DOB: February 24, 1961   Cancelled Treatment:    Reason Eval/Treat Not Completed: Other (comment)(Pt reported significant mid chest pain (epigastric area), RN in room to assess and offer medication to assist with pt 10/10 pain. Pt requested PT to come back later due to her pain levels. PT will follow up as able.)   Lieutenant Diego PT, DPT 11:13 AM,03/01/19 [phone]

## 2019-03-01 NOTE — Consult Note (Signed)
Pharmacy Antibiotic Note  Shelly [name] is a 58 y.o. female admitted on 02/27/2019 with Pneumonia. Patient with history of CHF, COPD on chronic O2, hypertension and diabetes presents with worsening SOB and hypoxia. CXR significant for pulmonary vascular congestion. BNP elevated and PCT <0.10. Patient with persistent AMS on BiPAP and was subsequently intubated. Pharmacy has been consulted for Unasyn dosing for possible aspiration PNA.   Patient extubated yesterday now on nasal cannula/BiPAP. WBC trending up today likely secondary to steroids. No fevers, last 24h Tmax 99.3.  Today is day 2 of anticipated 5 days of therapy.   Plan: Unasyn 3 g q6h IV   Height: 5\' 5"  (165.1 cm) Weight: 212 lb 8.4 oz (96.4 kg) IBW/kg (Calculated) : 57  Temp (24hrs), Avg:98.5 F (36.9 C), Min:97.3 F (36.3 C), Max:99.3 F (37.4 C)  Recent Labs  Lab 02/27/19 1404 02/28/19 0514 03/01/19 0559  WBC 9.9 6.6 10.6*  CREATININE 0.86 0.91 0.74    Estimated Creatinine Clearance: 90.2 mL/min (by C-G formula based on SCr of 0.74 mg/dL).    No Known Allergies  Antimicrobials this admission: Unasyn 9/21 >>    Dose adjustments this admission: 9/22: Patient was initiated on Unasyn 1.5 g q12h. Based on indication and renal function patient is a candidate for Unasyn 3 g q6h.   Microbiology results: 9/21 MRSA PCR: negative  Thank you for allowing pharmacy to be a part of this patient's care.  [place] Resident 03/01/2019 11:34 AM

## 2019-03-01 NOTE — Progress Notes (Signed)
CRITICAL CARE NOTE      CHIEF COMPLAINT:   Acute on chronic hypoxemic respiratory failure   SUBJECTIVE    Patient clinically improved.  She has significant LE edema with pain.  She continues to ask for anxiolytics.   Care plan discussed with Dr Manuella Ghazi,  Will optimize for transfer to med floor.    PAST MEDICAL HISTORY   Past Medical History:  Diagnosis Date  . Asthma   . CHF (congestive heart failure) ([place])   . COPD (chronic obstructive pulmonary disease) ([place])   . Diabetes mellitus without complication ([place])   . Hypertension   . Sleep apnea      SURGICAL HISTORY   Past Surgical History:  Procedure Laterality Date  . ABDOMINAL HYSTERECTOMY    . COLON SURGERY       FAMILY HISTORY   Family History  Adopted: Yes  Family history unknown: Yes     SOCIAL HISTORY   Social History   Tobacco Use  . Smoking status: Former Research scientist (life sciences)  . Smokeless tobacco: Never Used  Substance Use Topics  . Alcohol use: No  . Drug use: No     MEDICATIONS   Current Medication:  Current Facility-Administered Medications:  .  acetaminophen (TYLENOL) tablet 650 mg, 650 mg, Oral, Q6H PRN, 650 mg at 02/28/19 1956 **OR** acetaminophen (TYLENOL) suppository 650 mg, 650 mg, Rectal, Q6H PRN, Mayo, Pete Pelt, MD .  ALPRAZolam Duanne Moron) tablet 0.5 mg, 0.5 mg, Oral, TID PRN, Ottie Glazier, MD, 0.5 mg at 03/01/19 1115 .  alum & mag hydroxide-simeth (MAALOX/MYLANTA) 200-200-20 MG/5ML suspension 30 mL, 30 mL, Oral, Q4H PRN, Max Sane, MD, 30 mL at 03/01/19 1209 .  Ampicillin-Sulbactam (UNASYN) 3 g in sodium chloride 0.9 % 100 mL IVPB, 3 g, Intravenous, Q6H, Charlett Nose, RPH, Last Rate: 200 mL/hr at 03/01/19 1201, 3 g at 03/01/19 1201 .  baclofen (LIORESAL) tablet 10 mg, 10 mg, Oral, TID, Darel Hong D, NP, 10 mg  at 03/01/19 1558 .  budesonide (PULMICORT) nebulizer solution 0.5 mg, 0.5 mg, Nebulization, BID, Lanney Gins, Josecarlos Harriott, MD, 0.5 mg at 03/01/19 0758 .  Chlorhexidine Gluconate Cloth 2 % PADS 6 each, 6 each, Topical, Daily, Ottie Glazier, MD, 6 each at 03/01/19 1418 .  DULoxetine (CYMBALTA) DR capsule 30 mg, 30 mg, Oral, Daily, Mayo, Pete Pelt, MD, 30 mg at 03/01/19 0857 .  enoxaparin (LOVENOX) injection 40 mg, 40 mg, Subcutaneous, Q24H, Chrishawn Boley, MD, 40 mg at 02/28/19 2135 .  famotidine (PEPCID) tablet 20 mg, 20 mg, Oral, BID, Charlett Nose, RPH, 20 mg at 03/01/19 0857 .  [START ON 03/02/2019] furosemide (LASIX) injection 20 mg, 20 mg, Intravenous, BID, Benita Gutter, RPH .  ipratropium-albuterol (DUONEB) 0.5-2.5 (3) MG/3ML nebulizer solution 3 mL, 3 mL, Nebulization, Q4H, Esaiah Wanless, MD, 3 mL at 03/01/19 1531 .  MEDLINE mouth rinse, 15 mL, Mouth Rinse, BID, Cruise Baumgardner, MD, 15 mL at 02/28/19 2135 .  methylPREDNISolone sodium succinate (SOLU-MEDROL) 40 mg/mL injection 40 mg, 40 mg, Intravenous, Q8H, Fritzi Mandes, MD, 40 mg at 03/01/19 1426 .  ondansetron (ZOFRAN) tablet 4 mg, 4 mg, Oral, Q6H PRN **OR** ondansetron (ZOFRAN) injection 4 mg, 4 mg, Intravenous, Q6H PRN, Mayo, Pete Pelt, MD .  polyethylene glycol (MIRALAX / GLYCOLAX) packet 17 g, 17 g, Oral, Daily PRN, Mayo, Pete Pelt, MD .  tobramycin-dexamethasone [facility]) ophthalmic ointment, , Both Eyes, BID, Lanney Gins, Shriyan Arakawa, MD .  trimethoprim-polymyxin b (POLYTRIM) ophthalmic solution 1 drop, 1 drop, Both Eyes, Q4H, Mayo,  Pete Pelt, MD, 1 drop at 03/01/19 1556    ALLERGIES   Patient has no known allergies.    REVIEW OF SYSTEMS     Unable to obtain due to sedation on MV  PHYSICAL EXAMINATION   Vitals:   03/01/19 1324 03/01/19 1518  BP: 110/64 111/87  Pulse: (!) 103 67  Resp: (!) 22 19  Temp: 98.4 F (36.9 C)   SpO2: (!) 89% 92%    GENERAL:sedated on MV HEAD: Normocephalic, atraumatic.  EYES: Pupils equal,  round, reactive to light.  No scleral icterus.  MOUTH: Moist mucosal membrane. NECK: Supple. No thyromegaly. No nodules. No JVD.  PULMONARY: mild rhonchorous bs CARDIOVASCULAR: S1 and S2. Regular rate and rhythm. No murmurs, rubs, or gallops.  GASTROINTESTINAL: Soft, nontender, non-distended. No masses. Positive bowel sounds. No hepatosplenomegaly.  MUSCULOSKELETAL: No swelling, clubbing, or edema.  NEUROLOGIC: Mild distress due to acute illness SKIN:intact,warm,dry   PERTINENT DATA         Infusions: . ampicillin-sulbactam (UNASYN) IV 3 g (03/01/19 1201)   Scheduled Medications: . baclofen  10 mg Oral TID  . budesonide (PULMICORT) nebulizer solution  0.5 mg Nebulization BID  . Chlorhexidine Gluconate Cloth  6 each Topical Daily  . DULoxetine  30 mg Oral Daily  . enoxaparin (LOVENOX) injection  40 mg Subcutaneous Q24H  . famotidine  20 mg Oral BID  . [START ON 03/02/2019] furosemide  20 mg Intravenous BID  . ipratropium-albuterol  3 mL Nebulization Q4H  . mouth rinse  15 mL Mouth Rinse BID  . methylPREDNISolone (SOLU-MEDROL) injection  40 mg Intravenous Q8H  . tobramycin-dexamethasone   Both Eyes BID  . trimethoprim-polymyxin b  1 drop Both Eyes Q4H   PRN Medications:  Hemodynamic parameters:   Intake/Output: 09/22 0701 - 09/23 0700 In: 544.8 [I.V.:94.8; IV Piggyback:450] Out: 2850 [Urine:2850]  Ventilator  Settings: FiO2 (%):  [60 %] 60 %    LAB RESULTS:  Basic Metabolic Panel: Recent Labs  Lab 02/27/19 1404 02/28/19 0514 03/01/19 0559  NA 136 138 138  K 4.7 4.2 4.8  CL 89* 92* 92*  CO2 37* 35* 37*  GLUCOSE 110* 178* 154*  BUN 28* 23* 25*  CREATININE 0.86 0.91 0.74  CALCIUM 8.9 8.9 8.9  MG 2.3  --   --    Liver Function Tests: Recent Labs  Lab 02/27/19 1404  AST 27  ALT 48*  ALKPHOS 48  BILITOT 0.8  PROT 6.3*  ALBUMIN 3.6   No results for input(s): LIPASE, AMYLASE in the last 168 hours. No results for input(s): AMMONIA in the last 168  hours. CBC: Recent Labs  Lab 02/27/19 1404 02/28/19 0514 03/01/19 0559  WBC 9.9 6.6 10.6*  NEUTROABS 7.3  --   --   HGB 16.4* 17.4* 15.4*  HCT 53.2* 54.9* 50.7*  MCV 95.3 93.7 94.6  PLT 216 207 173   Cardiac Enzymes: No results for input(s): CKTOTAL, CKMB, CKMBINDEX, TROPONINI in the last 168 hours. BNP: Invalid input(s): POCBNP CBG: Recent Labs  Lab 02/27/19 1625 02/27/19 1844  GLUCAP 143* 135*     IMAGING RESULTS:  Imaging: Ct Head Wo Contrast  Result Date: 02/27/2019 CLINICAL DATA:  Altered level of consciousness EXAM: CT HEAD WITHOUT CONTRAST TECHNIQUE: Contiguous axial images were obtained from the base of the skull through the vertex without intravenous contrast. COMPARISON:  12/12/2017 FINDINGS: Brain: No acute intracranial abnormality. Specifically, no hemorrhage, hydrocephalus, mass lesion, acute infarction, or significant intracranial injury. Vascular: No hyperdense vessel or unexpected calcification.  Skull: No acute calvarial abnormality. Sinuses/Orbits: Visualized paranasal sinuses and mastoids clear. Orbital soft tissues unremarkable. Other: None IMPRESSION: Normal study. Electronically Signed   By: Rolm Baptise M.D.   On: 02/27/2019 18:01   Dg Chest Portable 1 View  Result Date: 02/27/2019 CLINICAL DATA:  Respiratory failure EXAM: PORTABLE CHEST 1 VIEW COMPARISON:  Same-day radiograph FINDINGS: Interval placement of endotracheal tube with distal tip terminating approximately 3.8 cm superior to the carina. Enteric tube courses below the diaphragm with distal tip beyond the inferior margin of the film. Stable cardiomediastinal contours. Calcific aortic knob. Chronic mild pulmonary vascular congestion. Linear bibasilar opacities, favor atelectasis. No focal airspace consolidation, pleural effusion, or pneumothorax. IMPRESSION: Satisfactory positioning of endotracheal and enteric tubes. Electronically Signed   By: Davina Poke M.D.   On: 02/27/2019 17:12       ASSESSMENT AND PLAN    -Multidisciplinary rounds held today  Acute on chronic Hypoxic and hypercarbic Respiratory Failure -Unasyn for possible aspiration due to RLL infiltrate -patient had been intubated for AECOPD in our MICU in past -swallow evaluation and initiate diet as appropriate    Possible drug overdose / suicidal attempt    - UDS pending  - patient recently lost mother    - will need official psychiatric evaluation for SI post extubation     Advanced COPD -continue DuoNEbs -Solumedrol 40 q12 - 8 PEEP on MV to recruit atelectatic segments - most recent PFT with severe obstructive ventilatory defect in 2019    Keratoconjuctivitis of right eye tobradex q12h BID x 3 days for now   ID -continue IV abx as prescibed -follow up cultures  GI/Nutrition GI PROPHYLAXIS as indicated DIET-->TF's as tolerated Constipation protocol as indicated  ENDO - ICU hypoglycemic\Hyperglycemia protocol -check FSBS per protocol   ELECTROLYTES -follow labs as needed -replace as needed -pharmacy consultation   DVT/GI PRX ordered -SCDs  TRANSFUSIONS AS NEEDED MONITOR FSBS ASSESS the need for LABS as needed   Critical care provider statement:    Critical care time (minutes):  36   Critical care time was exclusive of:  Separately billable procedures and treating other patients   Critical care was necessary to treat or prevent imminent or life-threatening deterioration of the following conditions:  Acute on chronic hypoxemic/hypercapnic resp failure, advanced COPD, multiple comorbid conditions.    Critical care was time spent personally by me on the following activities:  Development of treatment plan with patient or surrogate, discussions with consultants, evaluation of patient's response to treatment, examination of patient, obtaining history from patient or surrogate, ordering and performing treatments and interventions, ordering and review of laboratory studies and  re-evaluation of patient's condition.  I assumed direction of critical care for this patient from another provider in my specialty: no    This document was prepared using Dragon voice recognition software and may include unintentional dictation errors.    Ottie Glazier, M.D.  Division of [place]

## 2019-03-02 ENCOUNTER — Inpatient Hospital Stay: Payer: Medicare Other

## 2019-03-02 DIAGNOSIS — F411 Generalized anxiety disorder: Secondary | ICD-10-CM | POA: Diagnosis present

## 2019-03-02 LAB — CBC WITH DIFFERENTIAL/PLATELET
Abs Immature Granulocytes: 0.04 10*3/uL (ref 0.00–0.07)
Basophils Absolute: 0 10*3/uL (ref 0.0–0.1)
Basophils Relative: 0 %
Eosinophils Absolute: 0 10*3/uL (ref 0.0–0.5)
Eosinophils Relative: 0 %
HCT: 51 % — ABNORMAL HIGH (ref 36.0–46.0)
Hemoglobin: 15.8 g/dL — ABNORMAL HIGH (ref 12.0–15.0)
Immature Granulocytes: 1 %
Lymphocytes Relative: 4 %
Lymphs Abs: 0.3 10*3/uL — ABNORMAL LOW (ref 0.7–4.0)
MCH: 28.9 pg (ref 26.0–34.0)
MCHC: 31 g/dL (ref 30.0–36.0)
MCV: 93.4 fL (ref 80.0–100.0)
Monocytes Absolute: 0.4 10*3/uL (ref 0.1–1.0)
Monocytes Relative: 4 %
Neutro Abs: 7.7 10*3/uL (ref 1.7–7.7)
Neutrophils Relative %: 91 %
Platelets: 187 10*3/uL (ref 150–400)
RBC: 5.46 MIL/uL — ABNORMAL HIGH (ref 3.87–5.11)
RDW: 14.8 % (ref 11.5–15.5)
WBC: 8.5 10*3/uL (ref 4.0–10.5)
nRBC: 0 % (ref 0.0–0.2)

## 2019-03-02 LAB — MAGNESIUM: Magnesium: 2.6 mg/dL — ABNORMAL HIGH (ref 1.7–2.4)

## 2019-03-02 LAB — BASIC METABOLIC PANEL
Anion gap: 13 (ref 5–15)
BUN: 22 mg/dL — ABNORMAL HIGH (ref 6–20)
CO2: 38 mmol/L — ABNORMAL HIGH (ref 22–32)
Calcium: 8.9 mg/dL (ref 8.9–10.3)
Chloride: 85 mmol/L — ABNORMAL LOW (ref 98–111)
Creatinine, Ser: 0.72 mg/dL (ref 0.44–1.00)
GFR calc Af Amer: >60 mL/min (ref >60)
GFR calc non Af Amer: >60 mL/min (ref >60)
Glucose, Bld: 148 mg/dL — ABNORMAL HIGH (ref 70–99)
Potassium: 4.9 mmol/L (ref 3.5–5.1)
Sodium: 136 mmol/L (ref 135–145)

## 2019-03-02 MED ORDER — ALPRAZOLAM 0.5 MG PO TABS
1.0000 mg | ORAL_TABLET | Freq: Two times a day (BID) | ORAL | Status: DC | PRN
  Administered 2019-03-02: 1 mg via ORAL
  Filled 2019-03-02: qty 2

## 2019-03-02 MED ORDER — SODIUM CHLORIDE 0.9 % IV SOLN
INTRAVENOUS | Status: DC | PRN
  Administered 2019-03-02 – 2019-03-03 (×3): 250 mL via INTRAVENOUS
  Administered 2019-03-03: 10 mL via INTRAVENOUS
  Administered 2019-03-03: 250 mL via INTRAVENOUS
  Administered 2019-03-04: 10 mL via INTRAVENOUS

## 2019-03-02 MED ORDER — DULOXETINE HCL 30 MG PO CPEP
30.0000 mg | ORAL_CAPSULE | Freq: Two times a day (BID) | ORAL | Status: DC
  Administered 2019-03-02 – 2019-03-06 (×9): 30 mg via ORAL
  Filled 2019-03-02 (×9): qty 1

## 2019-03-02 MED ORDER — CLONAZEPAM 0.5 MG PO TABS
0.5000 mg | ORAL_TABLET | Freq: Three times a day (TID) | ORAL | Status: DC
  Administered 2019-03-02 – 2019-03-06 (×13): 0.5 mg via ORAL
  Filled 2019-03-02 (×13): qty 1

## 2019-03-02 NOTE — Consult Note (Signed)
[facility] Psychiatry Consult   Reason for Consult: Anxiety tremors Referring Physician: Manuella Ghazi Patient Identification: Shelly [name] MRN:  [id number] Principal Diagnosis: <principal problem not specified> Diagnosis:  Active Problems:   Acute on chronic respiratory failure with hypoxia and hypercapnia ([place])   Total Time spent with patient: 45 minutes  Subjective:   Shelly [name] is a 58 y.o. female patient admitted with COPD exacerbation.  Psychiatry consult was called for anxiety and tremors  HPI: Patient is a 58 year old female presenting to the hospital with a COPD exacerbation.  Complicating this presentation is the patient's anxiety.  Patient gives a long history of anxiety issues starting from when she was younger, but states that more recently her anxiety has been very high due to significant social stressors.  Patient goes on to explain that she lives next to next door to her elderly parents home she provides care for.  Patient further explains that approximately 3 weeks ago her mother fell, broke her back, and passed away.  Patient has been anxious during this time and had sought out anxiety medication (duloxetine) from her psychiatric providers.  More recently she has also had to deal with the death of her father-in-law who passed away just 3 days ago.  She is also significantly distressed by constant arguments that she is having with her brother regarding her parents finances.  Patient states that she just cannot go on with all the stress and she feels very shaky and nervous all the time.  Despite this she denies suicidal ideation and states that she has never been suicidal during the course of her life.  Patient states that in the past Klonopin has been helpful for her anxiety.  She denies any history of substance abuse issues.    Past Psychiatric History: Patient states she has a long history of anxiety stemming back to age 55 when she was pregnant with child and had to give the  child up for adoption.  Patient herself was adopted and feels some significance of this.  Patient was able to rekindle with her adopted daughter approximately 4 years ago.  Patient denies any history of suicide or inpatient psychiatric hospitalization.  She had been receiving medications from her primary care doctor but recently started seeing psychiatric providers approximately 4 weeks ago just prior to the death of her mother Risk to Self:   No Risk to Others:  No Prior Inpatient Therapy:  No Prior Outpatient Therapy:  Yes  Past Medical History:  Past Medical History:  Diagnosis Date  . Asthma   . CHF (congestive heart failure) ([place])   . COPD (chronic obstructive pulmonary disease) ([place])   . Diabetes mellitus without complication ([place])   . Hypertension   . Sleep apnea     Past Surgical History:  Procedure Laterality Date  . ABDOMINAL HYSTERECTOMY    . COLON SURGERY     Family History:  Family History  Adopted: Yes  Family history unknown: Yes   Family Psychiatric  History: Nonsignificant Social History: Patient lives at home with her husband.  Patient also has a son who is an adult and out of the house.  Patient used to work for [employer].  Patient has a brother with whom she has a tumultuous relationship.  Patient denies drugs or substance use. Social History   Substance and Sexual Activity  Alcohol Use No     Social History   Substance and Sexual Activity  Drug Use No    Social History  Socioeconomic History  . Marital status: Married    Spouse name: Not on file  . Number of children: Not on file  . Years of education: Not on file  . Highest education level: Not on file  Occupational History  . Not on file  Social Needs  . Financial resource strain: Not on file  . Food insecurity    Worry: Not on file    Inability: Not on file  . Transportation needs    Medical: Not on file    Non-medical: Not on file  Tobacco Use  . Smoking status: Former  Research scientist (life sciences)  . Smokeless tobacco: Never Used  Substance and Sexual Activity  . Alcohol use: No  . Drug use: No  . Sexual activity: Not on file  Lifestyle  . Physical activity    Days per week: Not on file    Minutes per session: Not on file  . Stress: Not on file  Relationships  . Social Herbalist on phone: Not on file    Gets together: Not on file    Attends religious service: Not on file    Active member of club or organization: Not on file    Attends meetings of clubs or organizations: Not on file    Relationship status: Not on file  Other Topics Concern  . Not on file  Social History Narrative  . Not on file   Additional Social History:    Allergies:  No Known Allergies  Labs:  Results for orders placed or performed during the hospital encounter of 02/27/19 (from the past 48 hour(s))  Basic metabolic panel     Status: Abnormal   Collection Time: 03/01/19  5:59 AM  Result Value Ref Range   Sodium 138 135 - 145 mmol/L   Potassium 4.8 3.5 - 5.1 mmol/L   Chloride 92 (L) 98 - 111 mmol/L   CO2 37 (H) 22 - 32 mmol/L   Glucose, Bld 154 (H) 70 - 99 mg/dL   BUN 25 (H) 6 - 20 mg/dL   Creatinine, Ser 0.74 0.44 - 1.00 mg/dL   Calcium 8.9 8.9 - 10.3 mg/dL   GFR calc non Af Amer >60 >60 mL/min   GFR calc Af Amer >60 >60 mL/min   Anion gap 9 5 - 15    Comment: Performed at [facility], [address] [place], [place] [address]  CBC     Status: Abnormal   Collection Time: 03/01/19  5:59 AM  Result Value Ref Range   WBC 10.6 (H) 4.0 - 10.5 K/uL   RBC 5.36 (H) 3.87 - 5.11 MIL/uL   Hemoglobin 15.4 (H) 12.0 - 15.0 g/dL   HCT 50.7 (H) 36.0 - 46.0 %   MCV 94.6 80.0 - 100.0 fL   MCH 28.7 26.0 - 34.0 pg   MCHC 30.4 30.0 - 36.0 g/dL   RDW 15.1 11.5 - 15.5 %   Platelets 173 150 - 400 K/uL   nRBC 0.0 0.0 - 0.2 %    Comment: Performed at [facility], [address], [place], [place] [id number]  Basic metabolic panel     Status: Abnormal   Collection  Time: 03/02/19  4:35 AM  Result Value Ref Range   Sodium 136 135 - 145 mmol/L   Potassium 4.9 3.5 - 5.1 mmol/L   Chloride 85 (L) 98 - 111 mmol/L   CO2 38 (H) 22 - 32 mmol/L   Glucose, Bld 148 (H) 70 -  99 mg/dL   BUN 22 (H) 6 - 20 mg/dL   Creatinine, Ser 0.72 0.44 - 1.00 mg/dL   Calcium 8.9 8.9 - 10.3 mg/dL   GFR calc non Af Amer >60 >60 mL/min   GFR calc Af Amer >60 >60 mL/min   Anion gap 13 5 - 15    Comment: Performed at [facility], [address] [place], [place] [address]  Magnesium     Status: Abnormal   Collection Time: 03/02/19  4:35 AM  Result Value Ref Range   Magnesium 2.6 (H) 1.7 - 2.4 mg/dL    Comment: Performed at [facility], [address] [place], [place] [address]  CBC with Differential/Platelet     Status: Abnormal   Collection Time: 03/02/19  4:35 AM  Result Value Ref Range   WBC 8.5 4.0 - 10.5 K/uL   RBC 5.46 (H) 3.87 - 5.11 MIL/uL   Hemoglobin 15.8 (H) 12.0 - 15.0 g/dL   HCT 51.0 (H) 36.0 - 46.0 %   MCV 93.4 80.0 - 100.0 fL   MCH 28.9 26.0 - 34.0 pg   MCHC 31.0 30.0 - 36.0 g/dL   RDW 14.8 11.5 - 15.5 %   Platelets 187 150 - 400 K/uL   nRBC 0.0 0.0 - 0.2 %   Neutrophils Relative % 91 %   Neutro Abs 7.7 1.7 - 7.7 K/uL   Lymphocytes Relative 4 %   Lymphs Abs 0.3 (L) 0.7 - 4.0 K/uL   Monocytes Relative 4 %   Monocytes Absolute 0.4 0.1 - 1.0 K/uL   Eosinophils Relative 0 %   Eosinophils Absolute 0.0 0.0 - 0.5 K/uL   Basophils Relative 0 %   Basophils Absolute 0.0 0.0 - 0.1 K/uL   Immature Granulocytes 1 %   Abs Immature Granulocytes 0.04 0.00 - 0.07 K/uL    Comment: Performed at [facility], [address] [place], [place] [address]    Current Facility-Administered Medications  Medication Dose Route Frequency Provider Last Rate Last Dose  . 0.9 %  sodium chloride infusion   Intravenous PRN Max Sane, MD   Stopped at 03/02/19 0615  . acetaminophen (TYLENOL) tablet 650 mg  650 mg Oral Q6H PRN Sela Hua, MD    650 mg at 02/28/19 1956   Or  . acetaminophen (TYLENOL) suppository 650 mg  650 mg Rectal Q6H PRN Mayo, Pete Pelt, MD      . alum & mag hydroxide-simeth (MAALOX/MYLANTA) 200-200-20 MG/5ML suspension 30 mL  30 mL Oral Q4H PRN Max Sane, MD   30 mL at 03/01/19 1749  . Ampicillin-Sulbactam (UNASYN) 3 g in sodium chloride 0.9 % 100 mL IVPB  3 g Intravenous Q6H Charlett Nose, RPH 200 mL/hr at 03/02/19 1320 3 g at 03/02/19 1320  . baclofen (LIORESAL) tablet 10 mg  10 mg Oral TID Bradly Bienenstock, NP   10 mg at 03/02/19 1029  . budesonide (PULMICORT) nebulizer solution 0.5 mg  0.5 mg Nebulization BID Ottie Glazier, MD   0.5 mg at 03/02/19 1151  . calcium carbonate (TUMS - dosed in mg elemental calcium) chewable tablet 200 mg of elemental calcium  1 tablet Oral TID WC PRN Awilda Bill, NP   200 mg of elemental calcium at 03/01/19 1835  . Chlorhexidine Gluconate Cloth 2 % PADS 6 each  6 each Topical Daily Ottie Glazier, MD   6 each at 03/01/19 1418  . clonazePAM (KLONOPIN) tablet 0.5 mg  0.5 mg Oral  TID Kayanna Mckillop, Dorene Ar, MD      . DULoxetine (CYMBALTA) DR capsule 30 mg  30 mg Oral BID Jaryd Drew A, MD      . enoxaparin (LOVENOX) injection 40 mg  40 mg Subcutaneous Q24H Ottie Glazier, MD   40 mg at 03/01/19 2113  . famotidine (PEPCID) tablet 20 mg  20 mg Oral BID Charlett Nose, RPH   20 mg at 03/02/19 1030  . furosemide (LASIX) injection 20 mg  20 mg Intravenous BID Benita Gutter, RPH   20 mg at 03/02/19 1030  . influenza vac split quadrivalent PF (FLUARIX) injection 0.5 mL  0.5 mL Intramuscular Tomorrow-1000 Manuella Ghazi, Vipul, MD      . ipratropium-albuterol (DUONEB) 0.5-2.5 (3) MG/3ML nebulizer solution 3 mL  3 mL Nebulization Q4H Ottie Glazier, MD   3 mL at 03/02/19 1151  . MEDLINE mouth rinse  15 mL Mouth Rinse BID Ottie Glazier, MD   15 mL at 03/02/19 1032  . methylPREDNISolone sodium succinate (SOLU-MEDROL) 40 mg/mL injection 40 mg  40 mg Intravenous Q8H Fritzi Mandes, MD    40 mg at 03/02/19 1317  . ondansetron (ZOFRAN) tablet 4 mg  4 mg Oral Q6H PRN Mayo, Pete Pelt, MD       Or  . ondansetron (ZOFRAN) injection 4 mg  4 mg Intravenous Q6H PRN Mayo, Pete Pelt, MD      . polyethylene glycol (MIRALAX / GLYCOLAX) packet 17 g  17 g Oral Daily PRN Mayo, Pete Pelt, MD      . trimethoprim-polymyxin b (POLYTRIM) ophthalmic solution 1 drop  1 drop Both Eyes Q4H Mayo, Pete Pelt, MD   1 drop at 03/02/19 1311    Musculoskeletal: Strength & Muscle Tone: within normal limits Gait & Station: Not assessed Patient leans: N/A  Psychiatric Specialty Exam: Physical Exam  Review of Systems  Constitutional: Positive for malaise/fatigue. Negative for chills, fever and weight loss.  HENT: Negative for hearing loss and sinus pain.   Eyes: Negative for photophobia.  Respiratory: Positive for shortness of breath.   Gastrointestinal: Positive for nausea.  Genitourinary: Negative for dysuria.  Musculoskeletal: Negative for neck pain.  Psychiatric/Behavioral: Negative for hallucinations, substance abuse and suicidal ideas. The patient is nervous/anxious.     Blood pressure 105/71, pulse 96, temperature 97.6 F (36.4 C), temperature source Oral, resp. rate 18, height 5\' 5"  (1.651 m), weight 95.4 kg, SpO2 (!) 89 %.Body mass index is 35 kg/m.  General Appearance: Fairly Groomed  Eye Contact:  Good  Speech:  Normal Rate and Slow  Volume:  Normal  Mood:  Anxious  Affect:  Congruent  Thought Process:  Coherent  Orientation:  Full (Time, Place, and Person)  Thought Content:  Logical  Suicidal Thoughts:  No  Homicidal Thoughts:  No  Memory:  Immediate;   Good  Judgement:  Good  Insight:  Fair  Psychomotor Activity:  Normal  Concentration:  Concentration: Good  Recall:  [place] of Knowledge:  Fair  Language:  Fair  Akathisia:  No  Handed:  Right  AIMS (if indicated):     Assets:  Communication Skills  ADL's:  Intact  Cognition:  WNL  Sleep:        Treatment Plan  Summary: Daily contact with patient to assess and evaluate symptoms and progress in treatment. Medication management: Discontinue Xanax Start clonazepam 0.5 mg 3 times daily Increase duloxetine to 30 mg twice daily (total daily dose 60 mg)  Disposition: Supportive therapy provided about  ongoing stressors.  Dixie Dials, MD 03/02/2019 3:27 PM

## 2019-03-02 NOTE — Plan of Care (Signed)
  Problem: Activity: Goal: Risk for activity intolerance will decrease Outcome: Progressing   Problem: Nutrition: Goal: Adequate nutrition will be maintained Outcome: Progressing   Problem: Coping: Goal: Level of anxiety will decrease Outcome: Progressing Note: PRN xanax   Problem: Pain Managment: Goal: General experience of comfort will improve Outcome: Progressing   Problem: Safety: Goal: Ability to remain free from injury will improve Outcome: Progressing   Problem: Skin Integrity: Goal: Risk for impaired skin integrity will decrease Outcome: Progressing

## 2019-03-02 NOTE — Care Management Important Message (Signed)
Important Message  Patient Details  Name: Shelly [name] MRN: [id number] Date of Birth: 1961/01/01   Medicare Important Message Given:  Yes     Dannette Barbara 03/02/2019, 12:26 PM

## 2019-03-02 NOTE — Progress Notes (Signed)
Will advance patients diet to soft diet.

## 2019-03-02 NOTE — Clinical Social Work Note (Signed)
CSW attempted to see patient however she was unavailable, will try to see at a later time.  Jones Broom. Lamari Youngers, MSW, [place]  03/02/2019 12:10 PM

## 2019-03-02 NOTE — Progress Notes (Signed)
[place] at [place] NAME: Shelly [name]    MR#:  [id number]  DATE OF BIRTH:  Nov 26, 1960  SUBJECTIVE:  patient came in with increasing shortness of breath. She was found to be hypoxic with hypercapnia intubated placed on the ventilator overnight. remains extubated. On N.C. transferred to the floor last evening  Weak, tremulous, short of breath REVIEW OF SYSTEMS:   Review of Systems  Constitutional: Negative for chills, fever and weight loss.  HENT: Negative for ear discharge, ear pain and nosebleeds.   Eyes: Negative for blurred vision, pain and discharge.  Respiratory: Positive for cough and shortness of breath. Negative for sputum production, wheezing and stridor.   Cardiovascular: Positive for orthopnea and leg swelling. Negative for chest pain, palpitations and PND.  Gastrointestinal: Negative for abdominal pain, diarrhea, nausea and vomiting.  Genitourinary: Negative for frequency and urgency.  Musculoskeletal: Negative for back pain and joint pain.  Neurological: Positive for weakness. Negative for sensory change, speech change and focal weakness.  Psychiatric/Behavioral: Negative for depression and hallucinations. The patient is not nervous/anxious.    Tolerating Diet:yes Tolerating PT: Home health  DRUG ALLERGIES:  No Known Allergies  VITALS:  Blood pressure 105/71, pulse 96, temperature 97.6 F (36.4 C), temperature source Oral, resp. rate 18, height 5\' 5"  (1.651 m), weight 95.4 kg, SpO2 (!) 89 %.  PHYSICAL EXAMINATION:   Physical Exam  GENERAL:  58 y.o.-year-old patient lying in the bed with no acute distress. Obese EYES: Pupils equal, round, reactive to light and accommodation. No scleral icterus. Extraocular muscles intact.  HEENT: Head atraumatic, normocephalic. Oropharynx and nasopharynx clear.  NECK:  Supple, no jugular venous distention. No thyroid enlargement, no tenderness.  LUNGS: coarse breath sounds  bilaterally, no wheezing, rales,scattered rhonchi. No use of accessory muscles of respiration.  CARDIOVASCULAR: S1, S2 normal. No murmurs, rubs, or gallops.  ABDOMEN: Soft, nontender, nondistended. Bowel sounds present. No organomegaly or mass.  EXTREMITIES: No cyanosis, clubbing  + edema b/l.    NEUROLOGIC: Cranial nerves II through XII are intact. No focal Motor or sensory deficits b/l.   PSYCHIATRIC:  patient is alert and oriented x 3.  Anxious SKIN: No obvious rash, lesion, or ulcer.   LABORATORY PANEL:  CBC Recent Labs  Lab 03/02/19 0435  WBC 8.5  HGB 15.8*  HCT 51.0*  PLT 187    Chemistries  Recent Labs  Lab 02/27/19 1404  03/02/19 0435  NA 136   < > 136  K 4.7   < > 4.9  CL 89*   < > 85*  CO2 37*   < > 38*  GLUCOSE 110*   < > 148*  BUN 28*   < > 22*  CREATININE 0.86   < > 0.72  CALCIUM 8.9   < > 8.9  MG 2.3  --  2.6*  AST 27  --   --   ALT 48*  --   --   ALKPHOS 48  --   --   BILITOT 0.8  --   --    < > = values in this interval not displayed.   Cardiac Enzymes No results for input(s): TROPONINI in the last 168 hours. RADIOLOGY:  US Venous Img Lower Unilateral Left  Result Date: 03/02/2019 CLINICAL DATA:  Left lower extremity pain and edema. EXAM: LEFT LOWER EXTREMITY VENOUS DOPPLER ULTRASOUND TECHNIQUE: Gray-scale sonography with graded compression, as well as color Doppler and duplex ultrasound were performed to evaluate the lower  extremity deep venous systems from the level of the common femoral vein and including the common femoral, femoral, profunda femoral, popliteal and calf veins including the posterior tibial, peroneal and gastrocnemius veins when visible. The superficial great saphenous vein was also interrogated. Spectral Doppler was utilized to evaluate flow at rest and with distal augmentation maneuvers in the common femoral, femoral and popliteal veins. COMPARISON:  None. FINDINGS: Contralateral Common Femoral Vein: Respiratory phasicity is normal and  symmetric with the symptomatic side. No evidence of thrombus. Normal compressibility. Common Femoral Vein: No evidence of thrombus. Normal compressibility, respiratory phasicity and response to augmentation. Saphenofemoral Junction: No evidence of thrombus. Normal compressibility and flow on color Doppler imaging. Profunda Femoral Vein: No evidence of thrombus. Normal compressibility and flow on color Doppler imaging. Femoral Vein: No evidence of thrombus. Normal compressibility, respiratory phasicity and response to augmentation. Popliteal Vein: No evidence of thrombus. Normal compressibility, respiratory phasicity and response to augmentation. Calf Veins: No evidence of thrombus. Normal compressibility and flow on color Doppler imaging. Superficial Great Saphenous Vein: No evidence of thrombus. Normal compressibility. Venous Reflux:  None. Other Findings: No evidence of superficial thrombophlebitis or abnormal fluid collection. IMPRESSION: No evidence of left lower extremity deep venous thrombosis. Electronically Signed   By: Aletta Edouard M.D.   On: 03/02/2019 08:51   ASSESSMENT AND PLAN:  Shelly [name]  is a 58 y.o. female with a known history of chronic respiratory failure secondary to COPD on 3 L O2 at baseline, type 2 diabetes, hypertension, OSA, CHF who presented to the ED with shortness of breath over the last week.  *Acute metabolic encephalopathy-  due to hypercarbia/hypoxia - VBG with pCO2 91. Intubated due to an inability to protect her airway. -CT head negative -UDS positive for cannabinoid -Close to her baseline mental status.   *Acute on chronic hypoxic and hypercapneic respiratory failure likely due to acute exacerbation of COPD- patient uses 3L O2 at baseline. -COVID test was negative -continue IV solumedrol -Duonebs every 6 hours as needed -Lasix IV BID 20 mg, -2.5 L -Continue home inhalers - Unasyn for possible aspiration due to RLL infiltrate -swallow evaluation and initiate  diet as appropriate  *Bacterial conjunctivitis of right eye -Trimethoprim-polymyxin drops every 4 hours  * Acute on chronic chronic diastolic CHF- -patient has bilateral lower extremity edema with  CXR showed some pulmonary vascular congestion -ECHO shows EF of 55 to 60%  *Hypertension- BP elevated in the ED -Continue home lisinopril  *Anxiety? -On Xanax, will get psychiatry consult   We will get physical and Occupational Therapy evaluation for disposition planning   CODE STATUS: full  DVT Prophylaxis: enoxaparin  TOTAL TIME TAKING CARE OF THIS PATIENT: 35 minutes.  >50% time spent on counselling and coordination of care  POSSIBLE D/C IN 1-2 DAYS, DEPENDING ON CLINICAL CONDITION.  Note: This dictation was prepared with Dragon dictation along with smaller phrase technology. Any transcriptional errors that result from this process are unintentional.  Max Sane M.D on 03/02/2019 at 1:21 PM  Between 7am to 6pm - Pager - [phone]  After 6pm go to [email or web address] - password [email or web address] [facility]  Office  [phone]  CC: Primary care physician; [name], NPPatient ID: Shelly [name], female   DOB: [birth year], 58 y.o.   MRN: [id number]

## 2019-03-02 NOTE — Progress Notes (Signed)
Alyse Low ford, friend, left message for nurse to call her.  Attempted to call her back but there was no answer.  I did not leave a message.

## 2019-03-03 LAB — CBC
HCT: 52.4 % — ABNORMAL HIGH (ref 36.0–46.0)
Hemoglobin: 16.1 g/dL — ABNORMAL HIGH (ref 12.0–15.0)
MCH: 29.1 pg (ref 26.0–34.0)
MCHC: 30.7 g/dL (ref 30.0–36.0)
MCV: 94.6 fL (ref 80.0–100.0)
Platelets: 177 10*3/uL (ref 150–400)
RBC: 5.54 MIL/uL — ABNORMAL HIGH (ref 3.87–5.11)
RDW: 14.6 % (ref 11.5–15.5)
WBC: 8.2 10*3/uL (ref 4.0–10.5)
nRBC: 0 % (ref 0.0–0.2)

## 2019-03-03 LAB — BASIC METABOLIC PANEL
Anion gap: 10 (ref 5–15)
BUN: 27 mg/dL — ABNORMAL HIGH (ref 6–20)
CO2: 41 mmol/L — ABNORMAL HIGH (ref 22–32)
Calcium: 8.8 mg/dL — ABNORMAL LOW (ref 8.9–10.3)
Chloride: 86 mmol/L — ABNORMAL LOW (ref 98–111)
Creatinine, Ser: 0.74 mg/dL (ref 0.44–1.00)
GFR calc Af Amer: >60 mL/min (ref >60)
GFR calc non Af Amer: >60 mL/min (ref >60)
Glucose, Bld: 150 mg/dL — ABNORMAL HIGH (ref 70–99)
Potassium: 5.1 mmol/L (ref 3.5–5.1)
Sodium: 137 mmol/L (ref 135–145)

## 2019-03-03 LAB — SARS CORONAVIRUS 2 (TAT 6-24 HRS): SARS Coronavirus 2: NEGATIVE

## 2019-03-03 MED ORDER — CLONAZEPAM 0.5 MG PO TABS: 0.5000 mg | ORAL_TABLET | Freq: Three times a day (TID) | ORAL | 0 refills | Status: DC

## 2019-03-03 MED ORDER — DULOXETINE HCL 30 MG PO CPEP: 30.0000 mg | ORAL_CAPSULE | Freq: Two times a day (BID) | ORAL | 0 refills | Status: DC

## 2019-03-03 MED ORDER — IPRATROPIUM-ALBUTEROL 0.5-2.5 (3) MG/3ML IN SOLN
3.0000 mL | Freq: Four times a day (QID) | RESPIRATORY_TRACT | Status: DC
  Administered 2019-03-03 – 2019-03-06 (×14): 3 mL via RESPIRATORY_TRACT
  Filled 2019-03-03 (×14): qty 3

## 2019-03-03 MED ORDER — METHYLPREDNISOLONE SODIUM SUCC 40 MG IJ SOLR
40.0000 mg | Freq: Every day | INTRAMUSCULAR | Status: DC
  Administered 2019-03-04: 40 mg via INTRAVENOUS
  Filled 2019-03-03: qty 1

## 2019-03-03 MED ORDER — AMOXICILLIN-POT CLAVULANATE 875-125 MG PO TABS: 1.0000 | ORAL_TABLET | Freq: Two times a day (BID) | ORAL | 0 refills | Status: DC

## 2019-03-03 MED ORDER — PREDNISONE 10 MG (21) PO TBPK: ORAL_TABLET | ORAL | 0 refills | Status: DC

## 2019-03-03 NOTE — Progress Notes (Signed)
[place] at [place] NAME: Shelly [name]    MR#:  [id number]  DATE OF BIRTH:  [birth year]  SUBJECTIVE:  patient came in with increasing shortness of breath. She was found to be hypoxic with hypercapnia intubated placed on the ventilator overnight. remains extubated. On N.C. transferred to the floor last evening  Very Weak, shortness of breath improving, gets hypoxic on minimal exertion REVIEW OF SYSTEMS:   Review of Systems  Constitutional: Negative for chills, fever and weight loss.  HENT: Negative for ear discharge, ear pain and nosebleeds.   Eyes: Negative for blurred vision, pain and discharge.  Respiratory: Positive for cough and shortness of breath. Negative for sputum production, wheezing and stridor.   Cardiovascular: Positive for orthopnea and leg swelling. Negative for chest pain, palpitations and PND.  Gastrointestinal: Negative for abdominal pain, diarrhea, nausea and vomiting.  Genitourinary: Negative for frequency and urgency.  Musculoskeletal: Negative for back pain and joint pain.  Neurological: Positive for weakness. Negative for sensory change, speech change and focal weakness.  Psychiatric/Behavioral: Negative for depression and hallucinations. The patient is not nervous/anxious.    Tolerating Diet:yes Tolerating PT: Home health  DRUG ALLERGIES:  No Known Allergies  VITALS:  Blood pressure 112/75, pulse 94, temperature 98.2 F (36.8 C), temperature source Oral, resp. rate 18, height 5\' 5"  (1.651 m), weight 95.4 kg, SpO2 (!) 89 %.  PHYSICAL EXAMINATION:   Physical Exam  GENERAL:  58 y.o.-year-old patient lying in the bed with no acute distress. Obese EYES: Pupils equal, round, reactive to light and accommodation. No scleral icterus. Extraocular muscles intact.  HEENT: Head atraumatic, normocephalic. Oropharynx and nasopharynx clear.  NECK:  Supple, no jugular venous distention. No thyroid enlargement, no  tenderness.  LUNGS: coarse breath sounds bilaterally, no wheezing, rales,scattered rhonchi. No use of accessory muscles of respiration.  CARDIOVASCULAR: S1, S2 normal. No murmurs, rubs, or gallops.  ABDOMEN: Soft, nontender, nondistended. Bowel sounds present. No organomegaly or mass.  EXTREMITIES: No cyanosis, clubbing  + edema b/l.    NEUROLOGIC: Cranial nerves II through XII are intact. No focal Motor or sensory deficits b/l.   PSYCHIATRIC:  patient is alert and oriented x 3.  Anxious SKIN: No obvious rash, lesion, or ulcer.   LABORATORY PANEL:  CBC Recent Labs  Lab 03/03/19 0632  WBC 8.2  HGB 16.1*  HCT 52.4*  PLT 177    Chemistries  Recent Labs  Lab 02/27/19 1404  03/02/19 0435 03/03/19 0632  NA 136   < > 136 137  K 4.7   < > 4.9 5.1  CL 89*   < > 85* 86*  CO2 37*   < > 38* 41*  GLUCOSE 110*   < > 148* 150*  BUN 28*   < > 22* 27*  CREATININE 0.86   < > 0.72 0.74  CALCIUM 8.9   < > 8.9 8.8*  MG 2.3  --  2.6*  --   AST 27  --   --   --   ALT 48*  --   --   --   ALKPHOS 48  --   --   --   BILITOT 0.8  --   --   --    < > = values in this interval not displayed.   Cardiac Enzymes No results for input(s): TROPONINI in the last 168 hours. RADIOLOGY:  US Venous Img Lower Unilateral Left  Result Date: 03/02/2019 CLINICAL DATA:  Left lower  extremity pain and edema. EXAM: LEFT LOWER EXTREMITY VENOUS DOPPLER ULTRASOUND TECHNIQUE: Gray-scale sonography with graded compression, as well as color Doppler and duplex ultrasound were performed to evaluate the lower extremity deep venous systems from the level of the common femoral vein and including the common femoral, femoral, profunda femoral, popliteal and calf veins including the posterior tibial, peroneal and gastrocnemius veins when visible. The superficial great saphenous vein was also interrogated. Spectral Doppler was utilized to evaluate flow at rest and with distal augmentation maneuvers in the common femoral, femoral and  popliteal veins. COMPARISON:  None. FINDINGS: Contralateral Common Femoral Vein: Respiratory phasicity is normal and symmetric with the symptomatic side. No evidence of thrombus. Normal compressibility. Common Femoral Vein: No evidence of thrombus. Normal compressibility, respiratory phasicity and response to augmentation. Saphenofemoral Junction: No evidence of thrombus. Normal compressibility and flow on color Doppler imaging. Profunda Femoral Vein: No evidence of thrombus. Normal compressibility and flow on color Doppler imaging. Femoral Vein: No evidence of thrombus. Normal compressibility, respiratory phasicity and response to augmentation. Popliteal Vein: No evidence of thrombus. Normal compressibility, respiratory phasicity and response to augmentation. Calf Veins: No evidence of thrombus. Normal compressibility and flow on color Doppler imaging. Superficial Great Saphenous Vein: No evidence of thrombus. Normal compressibility. Venous Reflux:  None. Other Findings: No evidence of superficial thrombophlebitis or abnormal fluid collection. IMPRESSION: No evidence of left lower extremity deep venous thrombosis. Electronically Signed   By: Aletta Edouard M.D.   On: 03/02/2019 08:51   ASSESSMENT AND PLAN:  Keasha Sirmons  is a 58 y.o. female with a known history of chronic respiratory failure secondary to COPD on 3 L O2 at baseline, type 2 diabetes, hypertension, OSA, CHF who presented to the ED with shortness of breath over the last week.  *Acute metabolic encephalopathy-  due to hypercarbia/hypoxia - VBG with pCO2 91. Intubated due to an inability to protect her airway. -CT head negative -UDS positive for cannabinoid -Close to her baseline mental status.   *Acute on chronic hypoxic and hypercapneic respiratory failure likely due to acute exacerbation of COPD- patient uses 3L O2 at baseline. -COVID test was negative -continue IV solumedrol - change to daily (from Q 8hrs) -Duonebs every 6 hours as  needed -Lasix IV BID 20 mg, -3 L -Continue home inhalers - Unasyn for possible aspiration due to RLL infiltrate - can switch to PO Abx if need at D/C  *Bacterial conjunctivitis of right eye -Trimethoprim-polymyxin drops every 4 hours  * Acute on chronic chronic diastolic CHF- -patient has bilateral lower extremity edema with  CXR showed some pulmonary vascular congestion -ECHO shows EF of 55 to 60%  *Hypertension- BP elevated in the ED -Continue home lisinopril  *Anxiety - Appreciate psych input - increase cymbalta to 30 mg BID - Added klonopin TID   PT reassessment today recommendation changed to SNF/STR - CSW made aware and COVID ordred   CODE STATUS: full  DVT Prophylaxis: enoxaparin  TOTAL TIME TAKING CARE OF THIS PATIENT: 35 minutes.  >50% time spent on counselling and coordination of care  POSSIBLE D/C IN 1-2 DAYS, DEPENDING ON CLINICAL CONDITION.  Note: This dictation was prepared with Dragon dictation along with smaller phrase technology. Any transcriptional errors that result from this process are unintentional.  Max Sane M.D on 03/03/2019 at 4:13 PM  Between 7am to 6pm - Pager - [phone]  After 6pm go to [email or web address] - password [email or web address] [facility]  Office  [phone]  CC: Primary care physician;  [name], NPPatient ID: Gabriela Eves, female   DOB: 12/23/1960, 58 y.o.   MRN: [id number]

## 2019-03-03 NOTE — Plan of Care (Signed)
Pt awaiting test results and placement for rehab.   Problem: Education: Goal: Knowledge of General Education information will improve Description: Including pain rating scale, medication(s)/side effects and non-pharmacologic comfort measures Outcome: Progressing   Problem: Health Behavior/Discharge Planning: Goal: Ability to manage health-related needs will improve Outcome: Progressing   Problem: Clinical Measurements: Goal: Ability to maintain clinical measurements within normal limits will improve Outcome: Progressing Goal: Will remain free from infection Outcome: Progressing Goal: Diagnostic test results will improve Outcome: Progressing Goal: Respiratory complications will improve Outcome: Progressing Goal: Cardiovascular complication will be avoided Outcome: Progressing   Problem: Activity: Goal: Risk for activity intolerance will decrease Outcome: Progressing   Problem: Nutrition: Goal: Adequate nutrition will be maintained Outcome: Progressing   Problem: Coping: Goal: Level of anxiety will decrease Outcome: Progressing   Problem: Elimination: Goal: Will not experience complications related to bowel motility Outcome: Progressing Goal: Will not experience complications related to urinary retention Outcome: Progressing   Problem: Pain Managment: Goal: General experience of comfort will improve Outcome: Progressing   Problem: Safety: Goal: Ability to remain free from injury will improve Outcome: Progressing   Problem: Skin Integrity: Goal: Risk for impaired skin integrity will decrease Outcome: Progressing

## 2019-03-03 NOTE — TOC Progression Note (Addendum)
Transition of Care [facility]) - Progression Note    Patient Details  Name: PIEPER ROLLING MRN: [id number] Date of Birth: 1960/10/24  [facility] CM/SW Contact  Ross Ludwig, [place] Phone Number: 03/03/2019, 4:50 PM  Clinical Narrative:     CSW presented bed offers to patient and she chose [facility] for short term rehab.  CSW contacted [facility] and they can accept her on Monday if she is medically ready for discharge and orders have been received.  CSW to continue to follow patient's progress throughout discharge planning.      Expected Discharge Plan and Services           Expected Discharge Date: 03/03/19                                     Social Determinants of Health (SDOH) Interventions    Readmission Risk Interventions No flowsheet data found.

## 2019-03-03 NOTE — Consult Note (Signed)
Pharmacy Antibiotic Note  Shelly [name] is a 58 y.o. female admitted on 02/27/2019 with Pneumonia. Pharmacy has been consulted for Unasyn dosing.    Plan: Unasyn 3 g q6h IV  Height: 5\' 5"  (165.1 cm) Weight: 210 lb 4.8 oz (95.4 kg) IBW/kg (Calculated) : 57  Temp (24hrs), Avg:98.2 F (36.8 C), Min:98.1 F (36.7 C), Max:98.3 F (36.8 C)  Recent Labs  Lab 02/27/19 1404 02/28/19 0514 03/01/19 0559 03/02/19 0435 03/03/19 0632  WBC 9.9 6.6 10.6* 8.5 8.2  CREATININE 0.86 0.91 0.74 0.72 0.74    Estimated Creatinine Clearance: 89.7 mL/min (by C-G formula based on SCr of 0.74 mg/dL).    No Known Allergies  Antimicrobials this admission: Unasyn 9/21 >>    Dose adjustments this admission: 9/22: Patient was initiated on Unasyn 1.5 g q12h. Based on indication and renal function patient is a candidate for Unasyn 3 g q6h.   Microbiology results: 9/21 MRSA PCR: negative  Thank you for allowing pharmacy to be a part of this patient's care.  Rayna Sexton, PharmD, BCPS Clinical Pharmacist 03/03/2019 11:35 AM ]

## 2019-03-03 NOTE — Progress Notes (Signed)
Physical Therapy Treatment Patient Details Name: Shelly [name] MRN: [id number] DOB: Aug 11, 1960 Today's Date: 03/03/2019    History of Present Illness 58 y/o female patient came in with increasing shortness of breath. She was found to be hypoxic with hypercapnia intubated placed on the vent. Extubated AM 9/22.    PT Comments    Discussed with RN prior to session.  RN voiced concerns regarding increased weakness over th past couple days and concerns over discharge home.    Pt agrees with concerns stating she is fearful to return home given her medical status.  Pt sitting edge of bed without assist.  3 lpm sats 88% at rest.  Stood x 1 with min a x 1 but limited to 30 seconds due to fatigue and general weakness limiting ability to step.  Sats 85/86% upon sitting taken on R and L hand.  Pt unable to recover after several minutes.  RN called and increased to 4 lpm where she continued to struggle to increase sats to 88% in supine  Min a for LE's onto bed.  Proper breathing techniques reviewed and practiced.  RN aware of sats and request for Tylenol for headache.    Pt is agreeable to SNF and feels she would benefit at this time.  Discussed with RN and SWS - Eric.   Follow Up Recommendations  SNF     Equipment Recommendations       Recommendations for Other Services       Precautions / Restrictions Precautions Precautions: Fall Restrictions Weight Bearing Restrictions: No    Mobility  Bed Mobility Overal bed mobility: Needs Assistance Bed Mobility: Sit to Supine       Sit to supine: Min assist   General bed mobility comments: for LE's  Transfers Overall transfer level: Needs assistance Equipment used: Rolling walker (2 wheeled) Transfers: Sit to/from Stand Sit to Stand: Min assist         General transfer comment: verbal cues for hand placements as she pulls up on walker  Ambulation/Gait             General Gait Details: unable to step due to LE weakness and  fatigue.   Stairs             Wheelchair Mobility    Modified Rankin (Stroke Patients Only)       Balance Overall balance assessment: Needs assistance Sitting-balance support: Feet supported;Bilateral upper extremity supported Sitting balance-Leahy Scale: Good     Standing balance support: Bilateral upper extremity supported Standing balance-Leahy Scale: Fair                              Cognition Arousal/Alertness: Awake/alert Behavior During Therapy: WFL for tasks assessed/performed Overall Cognitive Status: Within Functional Limits for tasks assessed                                        Exercises      General Comments        Pertinent Vitals/Pain Pain Assessment: No/denies pain    Home Living                      Prior Function            PT Goals (current goals can now be found in the care plan section) Progress towards PT goals:  Not progressing toward goals - comment    Frequency    Min 2X/week      PT Plan Discharge plan needs to be updated    Co-evaluation              AM-PAC PT "6 Clicks" Mobility   Outcome Measure  Help needed turning from your back to your side while in a flat bed without using bedrails?: None Help needed moving from lying on your back to sitting on the side of a flat bed without using bedrails?: None Help needed moving to and from a bed to a chair (including a wheelchair)?: A Little Help needed standing up from a chair using your arms (e.g., wheelchair or bedside chair)?: A Little Help needed to walk in hospital room?: A Lot Help needed climbing 3-5 steps with a railing? : A Lot 6 Click Score: 18    End of Session Equipment Utilized During Treatment: Gait belt Activity Tolerance: Patient limited by fatigue;Treatment limited secondary to medical complications (Comment) Patient left: in bed;with call bell/phone within reach;with bed alarm set Nurse Communication:  Mobility status;Other (comment)       Time: [id number] PT Time Calculation (min) (ACUTE ONLY): 23 min  Charges:  $Therapeutic Activity: 23-37 mins                     Chesley Noon, PTA 03/03/19, 10:53 AM

## 2019-03-03 NOTE — TOC Progression Note (Signed)
Transition of Care [facility]) - Progression Note    Patient Details  Name: Shelly [name] MRN: [id number] Date of Birth: 1960/07/28  [facility] CM/SW Contact  Ross Ludwig,  Phone Number: 03/03/2019, 12:52 PM  Clinical Narrative:     PT has worked with patient and now recommending short term rehab at [facility].  CSW has began bed search in [facility], awaiting bed offers.      Expected Discharge Plan and Services    Patient will be going to SNF for short term rehab.         Expected Discharge Date: 03/03/19                                     Social Determinants of Health (SDOH) Interventions    Readmission Risk Interventions No flowsheet data found.

## 2019-03-03 NOTE — NC FL2 (Signed)
[place] LEVEL OF CARE SCREENING TOOL     IDENTIFICATION  Patient Name: ABIAGEAL YUAN Birthdate: 1960-11-10 Sex: female Admission Date (Current Location): 02/27/2019  [place] and [place] Number:  Engineering geologist and Address:  [facility], [address] [place],  [address]      Provider Number: [id number]  Attending Physician Name and Address:  Max Sane, MD  Relative Name and Phone Number:  Jaynie Collins Father [phone]  or Cecille Rubin   [phone]    Current Level of Care: Hospital Recommended Level of Care: [place] Prior Approval Number:    Date Approved/Denied:   PASRR Number: [id number]  Discharge Plan: SNF    Current Diagnoses: Patient Active Problem List   Diagnosis Date Noted  . Generalized anxiety disorder 03/02/2019  . Acute on chronic respiratory failure with hypoxia and hypercapnia ([place]) 02/27/2019  . DM (diabetes mellitus), type 2 ([place]) 09/08/2016  . COPD exacerbation ([place]) 09/07/2016  . Neck pain 04/02/2016  . Abdominal pain 01/04/2014    Orientation RESPIRATION BLADDER Height & Weight     Self, Situation, Place  O2(3L) Continent Weight: 210 lb 4.8 oz (95.4 kg) Height:  5\' 5"  (165.1 cm)  BEHAVIORAL SYMPTOMS/MOOD NEUROLOGICAL BOWEL NUTRITION STATUS      Continent Diet(Soft diet)  AMBULATORY STATUS COMMUNICATION OF NEEDS Skin   Limited Assist Verbally Normal                       Personal Care Assistance Level of Assistance  Bathing, Feeding, Dressing Bathing Assistance: Limited assistance Feeding assistance: Independent Dressing Assistance: Limited assistance     Functional Limitations Info  Sight, Speech, Hearing Sight Info: Adequate Hearing Info: Adequate Speech Info: Adequate    SPECIAL CARE FACTORS FREQUENCY  PT (By licensed PT), OT (By licensed OT)     PT Frequency: Minimum 5x a week OT Frequency: Minimum 5x a week             Contractures Contractures Info: Not present    Additional Factors Info  Code Status, Allergies Code Status Info: Full Code Allergies Info: NKA           Current Medications (03/03/2019):  This is the current hospital active medication list Current Facility-Administered Medications  Medication Dose Route Frequency Provider Last Rate Last Dose  . 0.9 %  sodium chloride infusion   Intravenous PRN Max Sane, MD   Stopped at 03/03/19 [phone]  . acetaminophen (TYLENOL) tablet 650 mg  650 mg Oral Q6H PRN Mayo, Pete Pelt, MD   650 mg at 03/03/19 0430   Or  . acetaminophen (TYLENOL) suppository 650 mg  650 mg Rectal Q6H PRN Mayo, Pete Pelt, MD      . alum & mag hydroxide-simeth (MAALOX/MYLANTA) 200-200-20 MG/5ML suspension 30 mL  30 mL Oral Q4H PRN Max Sane, MD   30 mL at 03/01/19 1749  . Ampicillin-Sulbactam (UNASYN) 3 g in sodium chloride 0.9 % 100 mL IVPB  3 g Intravenous Q6H Charlett Nose, RPH   Stopped at 03/03/19 [id number]  . baclofen (LIORESAL) tablet 10 mg  10 mg Oral TID Bradly Bienenstock, NP   10 mg at 03/03/19 0901  . budesonide (PULMICORT) nebulizer solution 0.5 mg  0.5 mg Nebulization BID Ottie Glazier, MD   0.5 mg at 03/03/19 0747  . calcium carbonate (TUMS - dosed in mg elemental calcium) chewable tablet 200 mg of elemental calcium  1 tablet Oral TID  WC PRN Awilda Bill, NP   200 mg of elemental calcium at 03/01/19 1835  . clonazePAM (KLONOPIN) tablet 0.5 mg  0.5 mg Oral TID Dixie Dials, MD   0.5 mg at 03/03/19 0901  . DULoxetine (CYMBALTA) DR capsule 30 mg  30 mg Oral BID Cristofano, Dorene Ar, MD   30 mg at 03/03/19 0901  . enoxaparin (LOVENOX) injection 40 mg  40 mg Subcutaneous Q24H Ottie Glazier, MD   40 mg at 03/02/19 2146  . famotidine (PEPCID) tablet 20 mg  20 mg Oral BID Charlett Nose, RPH   20 mg at 03/03/19 0901  . furosemide (LASIX) injection 20 mg  20 mg Intravenous BID Benita Gutter, RPH   20 mg at 03/03/19 [id number]  . ipratropium-albuterol  (DUONEB) 0.5-2.5 (3) MG/3ML nebulizer solution 3 mL  3 mL Nebulization Q6H Manuella Ghazi, Vipul, MD      . MEDLINE mouth rinse  15 mL Mouth Rinse BID Ottie Glazier, MD   15 mL at 03/03/19 0908  . methylPREDNISolone sodium succinate (SOLU-MEDROL) 40 mg/mL injection 40 mg  40 mg Intravenous Q8H Fritzi Mandes, MD   40 mg at 03/03/19 0551  . ondansetron (ZOFRAN) tablet 4 mg  4 mg Oral Q6H PRN Mayo, Pete Pelt, MD       Or  . ondansetron [facility]) injection 4 mg  4 mg Intravenous Q6H PRN Mayo, Pete Pelt, MD   4 mg at 03/02/19 1703  . polyethylene glycol (MIRALAX / GLYCOLAX) packet 17 g  17 g Oral Daily PRN Mayo, Pete Pelt, MD      . trimethoprim-polymyxin b (POLYTRIM) ophthalmic solution 1 drop  1 drop Both Eyes Q4H Mayo, Pete Pelt, MD   1 drop at 03/03/19 1221     Discharge Medications: Please see discharge summary for a list of discharge medications.  Relevant Imaging Results:  Relevant Lab Results:   Additional Information SSN [id number]  Ross Ludwig, LCSW

## 2019-03-04 DIAGNOSIS — F411 Generalized anxiety disorder: Secondary | ICD-10-CM

## 2019-03-04 LAB — BASIC METABOLIC PANEL
Anion gap: 10 (ref 5–15)
BUN: 26 mg/dL — ABNORMAL HIGH (ref 6–20)
CO2: 43 mmol/L — ABNORMAL HIGH (ref 22–32)
Calcium: 8.9 mg/dL (ref 8.9–10.3)
Chloride: 86 mmol/L — ABNORMAL LOW (ref 98–111)
Creatinine, Ser: 0.68 mg/dL (ref 0.44–1.00)
GFR calc Af Amer: >60 mL/min (ref >60)
GFR calc non Af Amer: >60 mL/min (ref >60)
Glucose, Bld: 137 mg/dL — ABNORMAL HIGH (ref 70–99)
Potassium: 4.6 mmol/L (ref 3.5–5.1)
Sodium: 139 mmol/L (ref 135–145)

## 2019-03-04 LAB — CBC
HCT: 53.6 % — ABNORMAL HIGH (ref 36.0–46.0)
Hemoglobin: 16 g/dL — ABNORMAL HIGH (ref 12.0–15.0)
MCH: 28.6 pg (ref 26.0–34.0)
MCHC: 29.9 g/dL — ABNORMAL LOW (ref 30.0–36.0)
MCV: 95.9 fL (ref 80.0–100.0)
Platelets: 160 10*3/uL (ref 150–400)
RBC: 5.59 MIL/uL — ABNORMAL HIGH (ref 3.87–5.11)
RDW: 14.5 % (ref 11.5–15.5)
WBC: 7.2 10*3/uL (ref 4.0–10.5)
nRBC: 0 % (ref 0.0–0.2)

## 2019-03-04 LAB — GLUCOSE, CAPILLARY: Glucose-Capillary: 295 mg/dL — ABNORMAL HIGH (ref 70–99)

## 2019-03-04 MED ORDER — AMOXICILLIN-POT CLAVULANATE 875-125 MG PO TABS
1.0000 | ORAL_TABLET | Freq: Two times a day (BID) | ORAL | Status: DC
  Administered 2019-03-04 – 2019-03-05 (×3): 1 via ORAL
  Filled 2019-03-04 (×3): qty 1

## 2019-03-04 MED ORDER — AMOXICILLIN-POT CLAVULANATE 875-125 MG PO TABS: 1.0000 | ORAL_TABLET | Freq: Two times a day (BID) | ORAL | Status: DC

## 2019-03-04 MED ORDER — LISINOPRIL 5 MG PO TABS
2.5000 mg | ORAL_TABLET | Freq: Every day | ORAL | Status: DC
  Administered 2019-03-05 – 2019-03-06 (×2): 2.5 mg via ORAL
  Filled 2019-03-04 (×3): qty 1

## 2019-03-04 MED ORDER — PREDNISONE 20 MG PO TABS
40.0000 mg | ORAL_TABLET | Freq: Every day | ORAL | Status: DC
  Administered 2019-03-05: 40 mg via ORAL
  Filled 2019-03-04: qty 2

## 2019-03-04 MED ORDER — PREDNISONE 50 MG PO TABS: 50.0000 mg | ORAL_TABLET | Freq: Every day | ORAL | Status: DC

## 2019-03-04 MED ORDER — FUROSEMIDE 20 MG PO TABS
20.0000 mg | ORAL_TABLET | Freq: Every day | ORAL | Status: DC
  Administered 2019-03-05 – 2019-03-06 (×2): 20 mg via ORAL
  Filled 2019-03-04 (×2): qty 1

## 2019-03-04 NOTE — Plan of Care (Signed)

## 2019-03-04 NOTE — Discharge Instructions (Signed)
[facility] ([facility] & [facility]stance Use [facility]) & [facility]  Mental health service in [place], [place] Address: [address], [place], [place] [address] Hours:  Closed ? Caesar Bookman Phone: [phone]  COPD and Physical Activity Chronic obstructive pulmonary disease (COPD) is a long-term (chronic) condition that affects the lungs. COPD is a general term that can be used to describe many different lung problems that cause lung swelling (inflammation) and limit airflow, including chronic bronchitis and emphysema. The main symptom of COPD is shortness of breath, which makes it harder to do even simple tasks. This can also make it harder to exercise and be active. Talk with your health care provider about treatments to help you breathe better and actions you can take to prevent breathing problems during physical activity. What are the benefits of exercising with COPD? Exercising regularly is an important part of a healthy lifestyle. You can still exercise and do physical activities even though you have COPD. Exercise and physical activity improve your shortness of breath by increasing blood flow (circulation). This causes your heart to pump more oxygen through your body. Moderate exercise can improve your:  Oxygen use.  Energy level.  Shortness of breath.  Strength in your breathing muscles.  Heart health.  Sleep.  Self-esteem and feelings of self-worth.  Depression, stress, and anxiety levels. Exercise can benefit everyone with COPD. The severity of your disease may affect how hard you can exercise, especially at first, but everyone can benefit. Talk with your health care provider about how much exercise is safe for you, and which activities and exercises are safe for you. What actions can I take to prevent breathing problems during physical activity?  Sign up for a pulmonary  rehabilitation program. This type of program may include: ? Education about lung diseases. ? Exercise classes that teach you how to exercise and be more active while improving your breathing. This usually involves:  Exercise using your lower extremities, such as a stationary bicycle.  About 30 minutes of exercise, 2 to 5 times per week, for 6 to 12 weeks  Strength training, such as push ups or leg lifts. ? Nutrition education. ? Group classes in which you can talk with others who also have COPD and learn ways to manage stress.  If you use an oxygen tank, you should use it while you exercise. Work with your health care provider to adjust your oxygen for your physical activity. Your resting flow rate is different from your flow rate during physical activity.  While you are exercising: ? Take slow breaths. ? Pace yourself and do not try to go too fast. ? Purse your lips while breathing out. Pursing your lips is similar to a kissing or whistling position. ? If doing exercise that uses a quick burst of effort, such as weight lifting:  Breathe in before starting the exercise.  Breathe out during the hardest part of the exercise (such as raising the weights). Where to find support You can find support for exercising with COPD from:  Your health care provider.  A pulmonary rehabilitation program.  Your local health department or community health programs.  Support groups, online or in-person. Your health care provider may be able to recommend support groups. Where to find more information You can find more information about exercising with COPD from:  American Lung Association: ClassInsider.se.  COPD Foundation: https://www.rivera.net/. Contact a health care provider if:  Your symptoms  get worse.  You have chest pain.  You have nausea.  You have a fever.  You have trouble talking or catching your breath.  You want to start a new exercise program or a new activity. Summary  COPD is a  general term that can be used to describe many different lung problems that cause lung swelling (inflammation) and limit airflow. This includes chronic bronchitis and emphysema.  Exercise and physical activity improve your shortness of breath by increasing blood flow (circulation). This causes your heart to provide more oxygen to your body.  Contact your health care provider before starting any exercise program or new activity. Ask your health care provider what exercises and activities are safe for you. This information is not intended to replace advice given to you by your health care provider. Make sure you discuss any questions you have with your health care provider. Document Released: 06/17/2017 Document Revised: 09/14/2018 Document Reviewed: 06/17/2017 Elsevier Patient Education  2020 [employer].

## 2019-03-04 NOTE — Progress Notes (Signed)
[facility] at [place] NAME: Shelly [name]    MR#:  [id number]  DATE OF BIRTH:  Oct 28, 1960  SUBJECTIVE:  patient came in with increasing shortness of breath. She was found to be hypoxic with hypercapnia intubated placed on the ventilator overnight. remains extubated.   Very Weak, shortness of breath improving, gets hypoxic on minimal exertion REVIEW OF SYSTEMS:   Review of Systems  Constitutional: Negative for chills, fever and weight loss.  HENT: Negative for ear discharge, ear pain and nosebleeds.   Eyes: Negative for blurred vision, pain and discharge.  Respiratory: Positive for cough and shortness of breath. Negative for sputum production, wheezing and stridor.   Cardiovascular: Positive for orthopnea and leg swelling. Negative for chest pain, palpitations and PND.  Gastrointestinal: Negative for abdominal pain, diarrhea, nausea and vomiting.  Genitourinary: Negative for frequency and urgency.  Musculoskeletal: Negative for back pain and joint pain.  Neurological: Positive for weakness. Negative for sensory change, speech change and focal weakness.  Psychiatric/Behavioral: Negative for depression and hallucinations. The patient is not nervous/anxious.    Tolerating Diet:yes Tolerating PT: rehab  DRUG ALLERGIES:  No Known Allergies  VITALS:  Blood pressure 117/75, pulse 92, temperature 97.8 F (36.6 C), temperature source Oral, resp. rate 16, height 5\' 5"  (1.651 m), weight 95.1 kg, SpO2 (!) 80 %.  PHYSICAL EXAMINATION:   Physical Exam  GENERAL:  58 y.o.-year-old patient lying in the bed with no acute distress. Obese EYES: Pupils equal, round, reactive to light and accommodation. No scleral icterus. Extraocular muscles intact.  HEENT: Head atraumatic, normocephalic. Oropharynx and nasopharynx clear.  NECK:  Supple, no jugular venous distention. No thyroid enlargement, no tenderness.  LUNGS: coarse breath sounds bilaterally, no  wheezing, rales,scattered rhonchi. No use of accessory muscles of respiration.  CARDIOVASCULAR: S1, S2 normal. No murmurs, rubs, or gallops.  ABDOMEN: Soft, nontender, nondistended. Bowel sounds present. No organomegaly or mass.  EXTREMITIES: No cyanosis, clubbing  + edema b/l.    NEUROLOGIC: Cranial nerves II through XII are intact. No focal Motor or sensory deficits b/l.   PSYCHIATRIC:  patient is alert and oriented x 3.  Anxious SKIN: No obvious rash, lesion, or ulcer.   LABORATORY PANEL:  CBC Recent Labs  Lab 03/04/19 0602  WBC 7.2  HGB 16.0*  HCT 53.6*  PLT 160    Chemistries  Recent Labs  Lab 02/27/19 1404  03/02/19 0435  03/04/19 0602  NA 136   < > 136   < > 139  K 4.7   < > 4.9   < > 4.6  CL 89*   < > 85*   < > 86*  CO2 37*   < > 38*   < > 43*  GLUCOSE 110*   < > 148*   < > 137*  BUN 28*   < > 22*   < > 26*  CREATININE 0.86   < > 0.72   < > 0.68  CALCIUM 8.9   < > 8.9   < > 8.9  MG 2.3  --  2.6*  --   --   AST 27  --   --   --   --   ALT 48*  --   --   --   --   ALKPHOS 48  --   --   --   --   BILITOT 0.8  --   --   --   --    < > =  values in this interval not displayed.   Cardiac Enzymes No results for input(s): TROPONINI in the last 168 hours. RADIOLOGY:  No results found. ASSESSMENT AND PLAN:  Shelly [name]  is a 58 y.o. female with a known history of chronic respiratory failure secondary to COPD on 3 L O2 at baseline, type 2 diabetes, hypertension, OSA, CHF who presented to the ED with shortness of breath over the last week.  *Acute metabolic encephalopathy-  due to hypercarbia/hypoxia - VBG with pCO2 91. Intubated due to an inability to protect her airway. -CT head negative -UDS positive for cannabinoid -Close to her baseline mental status.   *Acute on chronic hypoxic and hypercapneic respiratory failure likely due to acute exacerbation of COPD- patient uses 3L O2 at baseline. -COVID test was negative -continue IV solumedrol - change to  daily -Duonebs every 6 hours as needed -Lasix IV BID 20 mg, -3 L--change to po lasix 20 mg qd -Continue home inhalers - Unasyn for possible aspiration due to RLL infiltrate - can switch to PO Abx  *Bacterial conjunctivitis of right eye -Trimethoprim-polymyxin drops every 4 hours  * Acute on chronic chronic diastolic CHF- -patient has bilateral lower extremity edema with  CXR showed some pulmonary vascular congestion -ECHO shows EF of 55 to 60% -IV alsix--to po lasix  *Hypertension- BP elevated in the ED -Continue home lisinopril  *Anxiety - Appreciate psych input - increase cymbalta to 30 mg BID - Added klonopin TID   PT reassessment today recommendation changed to SNF/STR - CSW made aware and COVID repeat negative  Patient will [facility] on Monday.  CODE STATUS: full  DVT Prophylaxis: enoxaparin  TOTAL TIME TAKING CARE OF THIS PATIENT: 25 minutes.  >50% time spent on counselling and coordination of care  POSSIBLE D/C IN 1-2 DAYS, DEPENDING ON CLINICAL CONDITION.  Note: This dictation was prepared with Dragon dictation along with smaller phrase technology. Any transcriptional errors that result from this process are unintentional.  Fritzi Mandes M.D on 03/04/2019 at 12:52 PM  Between 7am to 6pm - Pager - [phone]  After 6pm go to [email or web address] - password [email or web address] [facility]  Office  [phone]  CC: Primary care physician; [name], NPPatient ID: Shelly [name], female   DOB: 06-20-1960, 58 y.o.   MRN: [id number]

## 2019-03-04 NOTE — Progress Notes (Signed)
Notify Dr. Posey Pronto about patient still desating to 80's-90's in 5L, patient is alert and oriented. Asked if patient can be place back to telemetry monitor for [place] to place continuous pulse ox on her, order given. Patient is also taking clonazepam 0.5 mg TID, per MD ok to take as long as patient is not drowsy. RN will continue to monitor.

## 2019-03-04 NOTE — Progress Notes (Signed)
Called to bedside by NT at 1220h due to desaturation.  SpO2 72% on 4L/min via [place].  Pt stimulated and is arousable.  Complexion dusky; cap refill < 3 sec.  Oxygen increased to 5L; SpO2 increased to 78%.  RT called and bipap applied; SpO2 88%.  RT adjusted bipap settings; SpO2 90%.

## 2019-03-04 NOTE — Plan of Care (Signed)
Pt's condition is stable.  Continues to wear oxygen at 3 L/min via [place] and transfer independently from bed to [facility].  Awaiting transfer to rehab.   Problem: Education: Goal: Knowledge of General Education information will improve Description: Including pain rating scale, medication(s)/side effects and non-pharmacologic comfort measures 03/04/2019 1054 by Aubery Lapping, RN Outcome: Progressing 03/04/2019 1054 by Aubery Lapping, RN Outcome: Progressing   Problem: Health Behavior/Discharge Planning: Goal: Ability to manage health-related needs will improve 03/04/2019 1054 by Aubery Lapping, RN Outcome: Progressing 03/04/2019 1054 by Aubery Lapping, RN Outcome: Progressing   Problem: Clinical Measurements: Goal: Ability to maintain clinical measurements within normal limits will improve 03/04/2019 1054 by Aubery Lapping, RN Outcome: Progressing 03/04/2019 1054 by Aubery Lapping, RN Outcome: Progressing Goal: Will remain free from infection 03/04/2019 1054 by Aubery Lapping, RN Outcome: Progressing 03/04/2019 1054 by Aubery Lapping, RN Outcome: Progressing Goal: Diagnostic test results will improve 03/04/2019 1054 by Aubery Lapping, RN Outcome: Progressing 03/04/2019 1054 by Aubery Lapping, RN Outcome: Progressing Goal: Respiratory complications will improve 03/04/2019 1054 by Aubery Lapping, RN Outcome: Progressing 03/04/2019 1054 by Aubery Lapping, RN Outcome: Progressing Goal: Cardiovascular complication will be avoided 03/04/2019 1054 by Aubery Lapping, RN Outcome: Progressing 03/04/2019 1054 by Aubery Lapping, RN Outcome: Progressing   Problem: Activity: Goal: Risk for activity intolerance will decrease 03/04/2019 1054 by Aubery Lapping, RN Outcome: Progressing 03/04/2019 1054 by Aubery Lapping, RN Outcome: Progressing   Problem: Nutrition: Goal: Adequate nutrition will be maintained 03/04/2019 1054 by  Aubery Lapping, RN Outcome: Progressing 03/04/2019 1054 by Aubery Lapping, RN Outcome: Progressing   Problem: Coping: Goal: Level of anxiety will decrease 03/04/2019 1054 by Aubery Lapping, RN Outcome: Progressing 03/04/2019 1054 by Aubery Lapping, RN Outcome: Progressing   Problem: Elimination: Goal: Will not experience complications related to bowel motility 03/04/2019 1054 by Aubery Lapping, RN Outcome: Progressing 03/04/2019 1054 by Aubery Lapping, RN Outcome: Progressing Goal: Will not experience complications related to urinary retention 03/04/2019 1054 by Aubery Lapping, RN Outcome: Progressing 03/04/2019 1054 by Aubery Lapping, RN Outcome: Progressing   Problem: Pain Managment: Goal: General experience of comfort will improve 03/04/2019 1054 by Aubery Lapping, RN Outcome: Progressing 03/04/2019 1054 by Aubery Lapping, RN Outcome: Progressing   Problem: Safety: Goal: Ability to remain free from injury will improve 03/04/2019 1054 by Aubery Lapping, RN Outcome: Progressing 03/04/2019 1054 by Aubery Lapping, RN Outcome: Progressing   Problem: Skin Integrity: Goal: Risk for impaired skin integrity will decrease 03/04/2019 1054 by Aubery Lapping, RN Outcome: Progressing 03/04/2019 1054 by Aubery Lapping, RN Outcome: Progressing

## 2019-03-04 NOTE — Consult Note (Addendum)
[facility] Face-to-Face Psychiatry Consult   Reason for Consult: Anxiety tremors Referring Physician: Dr Manuella Ghazi Patient Identification: Shelly [name] MRN:  [id number] Principal Diagnosis: COPD exacerbation Diagnosis:  Active Problems:   COPD exacerbation ([place])   Acute on chronic respiratory failure with hypoxia and hypercapnia ([place])   Generalized anxiety disorder  Total Time spent with patient: 30 minutes  Subjective:   Shelly [name] is a 58 y.o. female patient admitted with COPD exacerbation.  Psychiatry consult was called for anxiety and tremors.  Reports today, "Anxiety is better than it was."    Patient seen and evaluated in person by this provider.  Denies suicidal/homicidal ideations, hallucinations, and substance abuse.  Klonopin started by the psychiatrist yesterday and taking Cymbalta already.  Reports improvement in her anxiety but still present.  Explained to her that the dose could not be increased related to the depressive effect of the medication and her her COPD diagnosis.  Patient stated understanding of this concern.  Depression stable at 3/10.  Sleep was "good last night", appetite is "fair."  Reports she is transferring to [facility] for rehab.  HPI per psychiatrist: Patient is a 58 year old female presenting to the hospital with a COPD exacerbation.  Complicating this presentation is the patient's anxiety.  Patient gives a long history of anxiety issues starting from when she was younger, but states that more recently her anxiety has been very high due to significant social stressors.  Patient goes on to explain that she lives next to next door to her elderly parents home she provides care for.  Patient further explains that approximately 3 weeks ago her mother fell, broke her back, and passed away.  Patient has been anxious during this time and had sought out anxiety medication (duloxetine) from her psychiatric providers.  More recently she has also had to deal with the death of  her father-in-law who passed away just 3 days ago.  She is also significantly distressed by constant arguments that she is having with her brother regarding her parents finances.  Patient states that she just cannot go on with all the stress and she feels very shaky and nervous all the time.  Despite this she denies suicidal ideation and states that she has never been suicidal during the course of her life.  Patient states that in the past Klonopin has been helpful for her anxiety.  She denies any history of substance abuse issues.  Past Psychiatric History: Patient states she has a long history of anxiety stemming back to age 77 when she was pregnant with child and had to give the child up for adoption.  Patient herself was adopted and feels some significance of this.  Patient was able to rekindle with her adopted daughter approximately 4 years ago.  Patient denies any history of suicide or inpatient psychiatric hospitalization.  She had been receiving medications from her primary care doctor but recently started seeing psychiatric providers approximately 4 weeks ago just prior to the death of her mother  Risk to Self:   No Risk to Others:  No Prior Inpatient Therapy:  No Prior Outpatient Therapy:  Yes  Past Medical History:  Past Medical History:  Diagnosis Date  . Asthma   . CHF (congestive heart failure) ([place])   . COPD (chronic obstructive pulmonary disease) ([place])   . Diabetes mellitus without complication ([place])   . Hypertension   . Sleep apnea     Past Surgical History:  Procedure Laterality Date  . ABDOMINAL HYSTERECTOMY    .  COLON SURGERY     Family History:  Family History  Adopted: Yes  Family history unknown: Yes   Family Psychiatric  History: Nonsignificant Social History: Patient lives at home with her husband.  Patient also has a son who is an adult and out of the house.  Patient used to work for [employer].  Patient has a brother with whom she has a tumultuous  relationship.  Patient denies drugs or substance use. Social History   Substance and Sexual Activity  Alcohol Use No     Social History   Substance and Sexual Activity  Drug Use No    Social History   Socioeconomic History  . Marital status: Married    Spouse name: Not on file  . Number of children: Not on file  . Years of education: Not on file  . Highest education level: Not on file  Occupational History  . Not on file  Social Needs  . Financial resource strain: Not on file  . Food insecurity    Worry: Not on file    Inability: Not on file  . Transportation needs    Medical: Not on file    Non-medical: Not on file  Tobacco Use  . Smoking status: Former Research scientist (life sciences)  . Smokeless tobacco: Never Used  Substance and Sexual Activity  . Alcohol use: No  . Drug use: No  . Sexual activity: Not on file  Lifestyle  . Physical activity    Days per week: Not on file    Minutes per session: Not on file  . Stress: Not on file  Relationships  . Social Herbalist on phone: Not on file    Gets together: Not on file    Attends religious service: Not on file    Active member of club or organization: Not on file    Attends meetings of clubs or organizations: Not on file    Relationship status: Not on file  Other Topics Concern  . Not on file  Social History Narrative  . Not on file   Additional Social History:    Allergies:  No Known Allergies  Labs:  Results for orders placed or performed during the hospital encounter of 02/27/19 (from the past 48 hour(s))  CBC     Status: Abnormal   Collection Time: 03/03/19  6:32 AM  Result Value Ref Range   WBC 8.2 4.0 - 10.5 K/uL   RBC 5.54 (H) 3.87 - 5.11 MIL/uL   Hemoglobin 16.1 (H) 12.0 - 15.0 g/dL   HCT 52.4 (H) 36.0 - 46.0 %   MCV 94.6 80.0 - 100.0 fL   MCH 29.1 26.0 - 34.0 pg   MCHC 30.7 30.0 - 36.0 g/dL   RDW 14.6 11.5 - 15.5 %   Platelets 177 150 - 400 K/uL   nRBC 0.0 0.0 - 0.2 %    Comment: Performed at  [facility], [address] [place],  [id number]  Basic metabolic panel     Status: Abnormal   Collection Time: 03/03/19  6:32 AM  Result Value Ref Range   Sodium 137 135 - 145 mmol/L   Potassium 5.1 3.5 - 5.1 mmol/L   Chloride 86 (L) 98 - 111 mmol/L   CO2 41 (H) 22 - 32 mmol/L   Glucose, Bld 150 (H) 70 - 99 mg/dL   BUN 27 (H) 6 - 20 mg/dL   Creatinine, Ser 0.74 0.44 - 1.00 mg/dL   Calcium 8.8 (L)  8.9 - 10.3 mg/dL   GFR calc non Af Amer >60 >60 mL/min   GFR calc Af Amer >60 >60 mL/min   Anion gap 10 5 - 15    Comment: Performed at [facility], [place], [place] [address]  SARS CORONAVIRUS 2 (TAT 6-24 HRS) Nasopharyngeal Nasopharyngeal Swab     Status: None   Collection Time: 03/03/19 11:45 AM   Specimen: Nasopharyngeal Swab  Result Value Ref Range   SARS Coronavirus 2 NEGATIVE NEGATIVE    Comment: (NOTE) SARS-CoV-2 target nucleic acids are NOT DETECTED. The SARS-CoV-2 RNA is generally detectable in upper and lower respiratory specimens during the acute phase of infection. Negative results do not preclude SARS-CoV-2 infection, do not rule out co-infections with other pathogens, and should not be used as the sole basis for treatment or other patient management decisions. Negative results must be combined with clinical observations, patient history, and epidemiological information. The expected result is Negative. Fact Sheet for Patients: SugarRoll.be Fact Sheet for Healthcare Providers: https://www.woods-mathews.com/ This test is not yet approved or cleared by the [place] FDA and  has been authorized for detection and/or diagnosis of SARS-CoV-2 by FDA under an Emergency Use Authorization (EUA). This EUA will remain  in effect (meaning this test can be used) for the duration of the COVID-19 declaration under Section 56 4(b)(1) of the Act, 21 U.S.C. section 360bbb-3(b)(1), unless the  authorization is terminated or revoked sooner. Performed at [facility], [place] [address], [place], [place] [address]   CBC     Status: Abnormal   Collection Time: 03/04/19  6:02 AM  Result Value Ref Range   WBC 7.2 4.0 - 10.5 K/uL   RBC 5.59 (H) 3.87 - 5.11 MIL/uL   Hemoglobin 16.0 (H) 12.0 - 15.0 g/dL   HCT 53.6 (H) 36.0 - 46.0 %   MCV 95.9 80.0 - 100.0 fL   MCH 28.6 26.0 - 34.0 pg   MCHC 29.9 (L) 30.0 - 36.0 g/dL   RDW 14.5 11.5 - 15.5 %   Platelets 160 150 - 400 K/uL   nRBC 0.0 0.0 - 0.2 %    Comment: Performed at [facility], [address] [place], [place] [id number]  Basic metabolic panel     Status: Abnormal   Collection Time: 03/04/19  6:02 AM  Result Value Ref Range   Sodium 139 135 - 145 mmol/L   Potassium 4.6 3.5 - 5.1 mmol/L   Chloride 86 (L) 98 - 111 mmol/L   CO2 43 (H) 22 - 32 mmol/L   Glucose, Bld 137 (H) 70 - 99 mg/dL   BUN 26 (H) 6 - 20 mg/dL   Creatinine, Ser 0.68 0.44 - 1.00 mg/dL   Calcium 8.9 8.9 - 10.3 mg/dL   GFR calc non Af Amer >60 >60 mL/min   GFR calc Af Amer >60 >60 mL/min   Anion gap 10 5 - 15    Comment: Performed at [facility], [address] [place], [place] [address]    Current Facility-Administered Medications  Medication Dose Route Frequency Provider Last Rate Last Dose  . 0.9 %  sodium chloride infusion   Intravenous PRN Max Sane, MD 10 mL/hr at 03/04/19 0318 10 mL at 03/04/19 0318  . acetaminophen (TYLENOL) tablet 650 mg  650 mg Oral Q6H PRN Sela Hua, MD   650 mg at 03/04/19 1101   Or  . acetaminophen (TYLENOL) suppository 650 mg  650 mg Rectal Q6H  PRN Mayo, Pete Pelt, MD      . alum & mag hydroxide-simeth (MAALOX/MYLANTA) 200-200-20 MG/5ML suspension 30 mL  30 mL Oral Q4H PRN Max Sane, MD   30 mL at 03/01/19 1749  . amoxicillin-clavulanate (AUGMENTIN) 875-125 MG per tablet 1 tablet  1 tablet Oral Q12H Fritzi Mandes, MD      . baclofen (LIORESAL) tablet 10 mg  10 mg Oral TID Bradly Bienenstock, NP    10 mg at 03/04/19 0900  . budesonide (PULMICORT) nebulizer solution 0.5 mg  0.5 mg Nebulization BID Ottie Glazier, MD   0.5 mg at 03/04/19 0730  . calcium carbonate (TUMS - dosed in mg elemental calcium) chewable tablet 200 mg of elemental calcium  1 tablet Oral TID WC PRN Awilda Bill, NP   200 mg of elemental calcium at 03/01/19 1835  . clonazePAM (KLONOPIN) tablet 0.5 mg  0.5 mg Oral TID Cristofano, Paul A, MD   0.5 mg at 03/04/19 0900  . DULoxetine (CYMBALTA) DR capsule 30 mg  30 mg Oral BID Cristofano, Dorene Ar, MD   30 mg at 03/04/19 0901  . enoxaparin (LOVENOX) injection 40 mg  40 mg Subcutaneous Q24H Ottie Glazier, MD   40 mg at 03/03/19 2129  . famotidine (PEPCID) tablet 20 mg  20 mg Oral BID Charlett Nose, RPH   20 mg at 03/04/19 0901  . [START ON 03/05/2019] furosemide (LASIX) tablet 20 mg  20 mg Oral Daily Fritzi Mandes, MD      . ipratropium-albuterol (DUONEB) 0.5-2.5 (3) MG/3ML nebulizer solution 3 mL  3 mL Nebulization Q6H Manuella Ghazi, Vip[place], MD   3 mL at 03/04/19 0730  . lisinopril (ZESTRIL) tablet 2.5 mg  2.5 mg Oral Daily Fritzi Mandes, MD      . MEDLINE mouth rinse  15 mL Mouth Rinse BID Ottie Glazier, MD   15 mL at 03/03/19 2302  . ondansetron (ZOFRAN) tablet 4 mg  4 mg Oral Q6H PRN Mayo, Pete Pelt, MD       Or  . ondansetron [facility]) injection 4 mg  4 mg Intravenous Q6H PRN Mayo, Pete Pelt, MD   4 mg at 03/02/19 1703  . polyethylene glycol (MIRALAX / GLYCOLAX) packet 17 g  17 g Oral Daily PRN Mayo, Pete Pelt, MD      . Derrill Memo ON 03/05/2019] predniSONE (DELTASONE) tablet 40 mg  40 mg Oral Q breakfast Fritzi Mandes, MD      . trimethoprim-polymyxin b (POLYTRIM) ophthalmic solution 1 drop  1 drop Both Eyes Q4H Mayo, Pete Pelt, MD   1 drop at 03/04/19 0900    Musculoskeletal: Strength & Muscle Tone: within normal limits Gait & Station: Not assessed Patient leans: N/A  Psychiatric Specialty Exam: Physical Exam  Nursing note and vitals reviewed. Constitutional: She is  oriented to person, place, and time. She appears well-developed and well-nourished.  HENT:  Head: Normocephalic.  Neck: Normal range of motion.  Respiratory: Effort normal.  Musculoskeletal: Normal range of motion.  Neurological: She is alert and oriented to person, place, and time.  Psychiatric: Her speech is normal and behavior is normal. Judgment and thought content normal. Her mood appears anxious. Cognition and memory are normal. She exhibits a depressed mood.    Review of Systems  Constitutional: Positive for malaise/fatigue. Negative for chills, fever and weight loss.  HENT: Negative for hearing loss and sinus pain.   Eyes: Negative for photophobia.  Respiratory: Positive for shortness of breath.   Gastrointestinal: Negative.  Genitourinary: Negative for dysuria.  Musculoskeletal: Negative for neck pain.  Neurological: Negative.   Endo/Heme/Allergies: Negative.   Psychiatric/Behavioral: Positive for depression. Negative for hallucinations, substance abuse and suicidal ideas. The patient is nervous/anxious.     Blood pressure 117/75, pulse 92, temperature 97.8 F (36.6 C), temperature source Oral, resp. rate 16, height 5\' 5"  (1.651 m), weight 95.1 kg, SpO2 (!) 80 %.Body mass index is 34.9 kg/m.  General Appearance: Fairly Groomed  Eye Contact:  Good  Speech:  Normal Rate and Slow  Volume:  Normal  Mood:  Anxious, depressed  Affect:  Congruent  Thought Process:  Coherent  Orientation:  Full (Time, Place, and Person)  Thought Content:  Logical  Suicidal Thoughts:  No  Homicidal Thoughts:  No  Memory:  Immediate;   Good  Judgement:  Good  Insight:  Fair  Psychomotor Activity:  Normal  Concentration:  Concentration: Good  Recall:  [place] of Knowledge:  Fair  Language:  Fair  Akathisia:  No  Handed:  Right  AIMS (if indicated):     Assets:  Communication Skills  ADL's:  Intact  Cognition:  WNL  Sleep:        Treatment Plan Summary: Major depressive  disorder, recurrent, mild: -Continue Cymbalta 30 mg BID  -Resources for RHA placed in discharge instructions  General anxiety d/o: -Continue clonazepam 0.5 mg 3 times daily  Disposition:  Transferring to rehab at [facility], stable psychiatrically  Waylan Boga, NP 03/04/2019 12:23 PM   Case discussed and plan agreed upon as outlined above by Dr. lowered.

## 2019-03-05 MED ORDER — MINERAL OIL RE ENEM
1.0000 | ENEMA | Freq: Once | RECTAL | Status: AC
Start: 2019-03-05 — End: 2019-03-05
  Administered 2019-03-05: 1 via RECTAL

## 2019-03-05 MED ORDER — BISACODYL 5 MG PO TBEC
5.0000 mg | DELAYED_RELEASE_TABLET | Freq: Every day | ORAL | Status: DC | PRN
  Administered 2019-03-05 – 2019-03-06 (×2): 5 mg via ORAL
  Filled 2019-03-05 (×3): qty 1

## 2019-03-05 NOTE — Progress Notes (Addendum)
Update 2235: Pt enema was administered. Will continue to monitor.

## 2019-03-05 NOTE — Plan of Care (Signed)
Tolerating oxygen at 4L/min via [place] with SpO2 of 91%. Transitioning from chair to [facility] independently.   Problem: Education: Goal: Knowledge of General Education information will improve Description: Including pain rating scale, medication(s)/side effects and non-pharmacologic comfort measures Outcome: Progressing   Problem: Health Behavior/Discharge Planning: Goal: Ability to manage health-related needs will improve Outcome: Progressing   Problem: Clinical Measurements: Goal: Ability to maintain clinical measurements within normal limits will improve Outcome: Progressing Goal: Will remain free from infection Outcome: Progressing Goal: Diagnostic test results will improve Outcome: Progressing Goal: Respiratory complications will improve Outcome: Progressing Goal: Cardiovascular complication will be avoided Outcome: Progressing   Problem: Activity: Goal: Risk for activity intolerance will decrease Outcome: Progressing   Problem: Nutrition: Goal: Adequate nutrition will be maintained Outcome: Progressing   Problem: Coping: Goal: Level of anxiety will decrease Outcome: Progressing   Problem: Elimination: Goal: Will not experience complications related to bowel motility Outcome: Progressing Goal: Will not experience complications related to urinary retention Outcome: Progressing   Problem: Pain Managment: Goal: General experience of comfort will improve Outcome: Progressing   Problem: Safety: Goal: Ability to remain free from injury will improve Outcome: Progressing   Problem: Skin Integrity: Goal: Risk for impaired skin integrity will decrease Outcome: Progressing

## 2019-03-05 NOTE — Progress Notes (Signed)
[place] [facility] [place] NAME: Shelly [name]    MR#:  [id number]  DATE OF BIRTH:  08-26-1960  SUBJECTIVE:  patient came in with increasing shortness of breath. She was found to be hypoxic with hypercapnia intubated placed on the ventilator overnight. remains extubated.   Very Weak, shortness of breath improving, gets hypoxic on minimal exertion REVIEW OF SYSTEMS:   Review of Systems  Constitutional: Negative for chills, fever and weight loss.  HENT: Negative for ear discharge, ear pain and nosebleeds.   Eyes: Negative for blurred vision, pain and discharge.  Respiratory: Positive for cough and shortness of breath. Negative for sputum production, wheezing and stridor.   Cardiovascular: Positive for orthopnea and leg swelling. Negative for chest pain, palpitations and PND.  Gastrointestinal: Negative for abdominal pain, diarrhea, nausea and vomiting.  Genitourinary: Negative for frequency and urgency.  Musculoskeletal: Negative for back pain and joint pain.  Neurological: Positive for weakness. Negative for sensory change, speech change and focal weakness.  Psychiatric/Behavioral: Negative for depression and hallucinations. The patient is not nervous/anxious.    Tolerating Diet:yes Tolerating PT: rehab  DRUG ALLERGIES:  No Known Allergies  VITALS:  Blood pressure 111/78, pulse 85, temperature 97.7 F (36.5 C), temperature source Oral, resp. rate 19, height 5\' 5"  (1.651 m), weight 90.7 kg, SpO2 91 %.  PHYSICAL EXAMINATION:   Physical Exam  GENERAL:  58 y.o.-year-old patient lying in the bed with no acute distress. Obese EYES: Pupils equal, round, reactive to light and accommodation. No scleral icterus. Extraocular muscles intact.  HEENT: Head atraumatic, normocephalic. Oropharynx and nasopharynx clear.  NECK:  Supple, no jugular venous distention. No thyroid enlargement, no tenderness.  LUNGS: coarse breath sounds bilaterally, no  wheezing, rales,scattered rhonchi. No use of accessory muscles of respiration.  CARDIOVASCULAR: S1, S2 normal. No murmurs, rubs, or gallops.  ABDOMEN: Soft, nontender, nondistended. Bowel sounds present. No organomegaly or mass.  EXTREMITIES: No cyanosis, clubbing  + edema b/l.    NEUROLOGIC: Cranial nerves II through XII are intact. No focal Motor or sensory deficits b/l.   PSYCHIATRIC:  patient is alert and oriented x 3.  Anxious SKIN: No obvious rash, lesion, or ulcer.   LABORATORY PANEL:  CBC Recent Labs  Lab 03/04/19 0602  WBC 7.2  HGB 16.0*  HCT 53.6*  PLT 160    Chemistries  Recent Labs  Lab 02/27/19 1404  03/02/19 0435  03/04/19 0602  NA 136   < > 136   < > 139  K 4.7   < > 4.9   < > 4.6  CL 89*   < > 85*   < > 86*  CO2 37*   < > 38*   < > 43*  GLUCOSE 110*   < > 148*   < > 137*  BUN 28*   < > 22*   < > 26*  CREATININE 0.86   < > 0.72   < > 0.68  CALCIUM 8.9   < > 8.9   < > 8.9  MG 2.3  --  2.6*  --   --   AST 27  --   --   --   --   ALT 48*  --   --   --   --   ALKPHOS 48  --   --   --   --   BILITOT 0.8  --   --   --   --    < > =  values in this interval not displayed.   Cardiac Enzymes No results for input(s): TROPONINI in the last 168 hours. RADIOLOGY:  No results found. ASSESSMENT AND PLAN:  Shelly [name]  is a 58 y.o. female with a known history of chronic respiratory failure secondary to COPD on 3 L O2 at baseline, type 2 diabetes, hypertension, OSA, CHF who presented to the ED with shortness of breath over the last week.  *Acute metabolic encephalopathy-  due to hypercarbia/hypoxia - VBG with pCO2 91. Intubated due to an inability to protect her airway. -CT head negative -UDS positive for cannabinoid -Close to her baseline mental status.   *Acute on chronic hypoxic and hypercapneic respiratory failure likely due to acute exacerbation of COPD- patient uses 3L O2 at baseline. -COVID test was negative -continue IV solumedrol - change to  daily -Duonebs every 6 hours as needed -Lasix IV BID 20 mg, -3 L--change to po lasix 20 mg qd -Continue home inhalers - Unasyn for possible aspiration due to RLL infiltrate - can switch to PO Abx  *Bacterial conjunctivitis of right eye -Trimethoprim-polymyxin drops every 4 hours  * Acute on chronic chronic diastolic CHF- -patient has bilateral lower extremity edema with  CXR showed some pulmonary vascular congestion -ECHO shows EF of 55 to 60% -IV alsix--to po lasix  *Hypertension- BP elevated in the ED -Continue home lisinopril  *Anxiety - Appreciate psych input - increase cymbalta to 30 mg BID - Added klonopin TID   PT reassessment today recommendation changed to SNF/STR - CSW made aware and COVID repeat negative  Patient will discharged to [facility] on Monday.  CODE STATUS: full  DVT Prophylaxis: enoxaparin  TOTAL TIME TAKING CARE OF THIS PATIENT: 25 minutes.  >50% time spent on counselling and coordination of care  POSSIBLE D/C IN 1-2 DAYS, DEPENDING ON CLINICAL CONDITION.  Note: This dictation was prepared with Dragon dictation along with smaller phrase technology. Any transcriptional errors that result from this process are unintentional.  Fritzi Mandes M.D on 03/05/2019 at 11:34 AM  Between 7am to 6pm - Pager - [phone]  After 6pm go to [email or web address] - password [email or web address] [place]  Office  [phone]  CC: Primary care physician; [name], NPPatient ID: Shelly [name], female   DOB: January 24, 1961, 58 y.o.   MRN: [id number]

## 2019-03-05 NOTE — Plan of Care (Signed)
  Problem: Clinical Measurements: Goal: Ability to maintain clinical measurements within normal limits will improve Outcome: Progressing   Problem: Clinical Measurements: Goal: Will remain free from infection Outcome: Progressing   Problem: Clinical Measurements: Goal: Cardiovascular complication will be avoided Outcome: Progressing   Problem: Pain Managment: Goal: General experience of comfort will improve Outcome: Not Progressing: Pt c/o neck pain that is worsened by wearing Bipap at night. PRN pain medication given. Will continue to monitor.

## 2019-03-06 LAB — SARS CORONAVIRUS 2 (TAT 6-24 HRS): SARS Coronavirus 2: NEGATIVE

## 2019-03-06 MED ORDER — ALBUTEROL SULFATE HFA 108 (90 BASE) MCG/ACT IN AERS: 2.0000 | INHALATION_SPRAY | Freq: Four times a day (QID) | RESPIRATORY_TRACT | 0 refills | Status: AC | PRN

## 2019-03-06 MED ORDER — FUROSEMIDE 20 MG PO TABS: 20.0000 mg | ORAL_TABLET | Freq: Two times a day (BID) | ORAL | 1 refills | Status: DC

## 2019-03-06 MED ORDER — FLUTICASONE-SALMETEROL 250-50 MCG/DOSE IN AEPB: 1.0000 | INHALATION_SPRAY | Freq: Two times a day (BID) | RESPIRATORY_TRACT | 0 refills | Status: DC

## 2019-03-06 NOTE — Plan of Care (Signed)

## 2019-03-06 NOTE — TOC Transition Note (Signed)
Transition of Care [facility]) - CM/SW Discharge Note   Patient Details  Name: Shelly [name] MRN: [id number] Date of Birth: 09/16/1960  Transition of Care [facility]) CM/SW Contact:  Elza Rafter, RN Phone Number: 03/06/2019, 10:49 AM   Clinical Narrative:   Patient may DC to [place] room 508.  RN to call report to [phone] after COVID results.  EMS packet placed on chart.            Patient Goals and CMS Choice        Discharge Placement                       Discharge Plan and Services                                     Social Determinants of Health (SDOH) Interventions     Readmission Risk Interventions No flowsheet data found.

## 2019-03-06 NOTE — Progress Notes (Signed)
Dr. Manuella Ghazi wanted to get pt to [facility] tonight, since COVID results came back negative, pt is agreeable to getting discharged tonight, report called to [place] [facility], all night time medications given. EMS called, waiting for pick up.

## 2019-03-06 NOTE — Progress Notes (Signed)
EMS here to get pt, IV in right forearm removed, telemetry removed, pt updated husband herself. No complaints of pain & vitals stable. Paperwork and medications sent with pt.   Conley Simmonds, RN, BSN

## 2019-03-06 NOTE — Discharge Summary (Addendum)
[facility] NAME: Shelly [name]    MR#:  [id number]  DATE OF BIRTH:  01-17-1961  DATE OF ADMISSION:  02/27/2019 ADMITTING PHYSICIAN: Sela Hua, MD  DATE OF DISCHARGE: 03/06/2019  PRIMARY CARE PHYSICIAN: [name], NP    ADMISSION DIAGNOSIS:  Acute on chronic respiratory failure with hypoxia and hypercapnia (HCC) [J96.21, J96.22]  DISCHARGE DIAGNOSIS:  Acute on chronic respiratory failure with hypoxia/Hypercapnia due to CHF and COPD exacerbation Acute on chronic diastolic HF OSA  SECONDARY DIAGNOSIS:   Past Medical History:  Diagnosis Date  . Asthma   . CHF (congestive heart failure) ([place])   . COPD (chronic obstructive pulmonary disease) ([place])   . Diabetes mellitus without complication ([place])   . Hypertension   . Sleep apnea     HOSPITAL COURSE:   [name] a58 y.o.femalewith a known history of chronic respiratory failure secondary to COPD on 3 L O2 at baseline, type 2 diabetes, hypertension, OSA, CHF who presented to the ED with shortness of breath over the last week.  *Acute metabolic encephalopathy-  due to hypercarbia/hypoxia - VBG with pCO2 91. Intubated due to an inability to protect her airway. -CT head negative -UDS positive for cannabinoid -Close to her baseline mental status.   *Acute on chronic hypoxic and hypercapneic respiratory failure likely due to acute exacerbation of COPD- patient uses 3L O2 at baseline. -COVID test was negative -Duonebs every 6 hours as needed -Lasix IV BID 20 mg, -3 L--change to po lasix 20 mg bid -weight loss of about 9 kgs and 13 L negative -Continue home inhalers - received Unasyn for possible aspiration due to RLL infiltrate -completed po abx for 7 days -d/c steroids. Took for 7 days--no wheezing  *Bacterial conjunctivitis of right eye--improved -Trimethoprim-polymyxin drops every 4 hours  * Acute on chronic chronic diastolic CHF- -patient has  bilateral lower extremity edema with  CXR showed some pulmonary vascular congestion -ECHO shows EF of 55 to 60% -IV lasix--to po lasix  *Hypertension- BP elevated in the ED -Continue home lisinopril  *Anxiety - Appreciate psych input - increase cymbalta to 30 mg BID - Added klonopin TID  Overall much improved. D/c to [facility] CONSULTS OBTAINED:    DRUG ALLERGIES:  No Known Allergies  DISCHARGE MEDICATIONS:   Allergies as of 03/06/2019   No Known Allergies     Medication List    STOP taking these medications   ipratropium-albuterol 0.5-2.5 (3) MG/3ML Soln Commonly known as: DUONEB     TAKE these medications   baclofen 10 MG tablet Commonly known as: LIORESAL Take 10 mg by mouth 3 (three) times daily.   chlorpheniramine-HYDROcodone 10-8 MG/5ML Suer Commonly known as: Tussionex Pennkinetic ER Take 5 mLs by mouth every 12 (twelve) hours as needed for cough. What changed: when to take this   clonazePAM 0.5 MG tablet Commonly known as: KLONOPIN Take 1 tablet (0.5 mg total) by mouth 3 (three) times daily.   DULoxetine 30 MG capsule Commonly known as: CYMBALTA Take 1 capsule (30 mg total) by mouth 2 (two) times daily. What changed: when to take this   fluticasone furoate-vilanterol 200-25 MCG/INH Aepb Commonly known as: BREO ELLIPTA Inhale 1 puff into the lungs daily.   Fluticasone-Salmeterol 250-50 MCG/DOSE Aepb Commonly known as: Advair Diskus Inhale 1 puff into the lungs 2 (two) times daily.   furosemide 20 MG tablet Commonly known as: LASIX Take 1 tablet (20 mg total) by mouth 2 (two) times  daily.   lisinopril 20 MG tablet Commonly known as: ZESTRIL Take 20 mg by mouth daily.   Ventolin HFA 108 (90 Base) MCG/ACT inhaler Generic drug: albuterol Inhale 2 puffs into the lungs every 6 (six) hours as needed. What changed: Another medication with the same name was added. Make sure you understand how and when to take each.   albuterol 108 (90  Base) MCG/ACT inhaler Commonly known as: VENTOLIN HFA Inhale 2 puffs into the lungs every 6 (six) hours as needed for wheezing or shortness of breath. What changed: You were already taking a medication with the same name, and this prescription was added. Make sure you understand how and when to take each.       If you experience worsening of your admission symptoms, develop shortness of breath, life threatening emergency, suicidal or homicidal thoughts you must seek medical attention immediately by calling 911 or calling your MD immediately  if symptoms less severe.  You Must read complete instructions/literature along with all the possible adverse reactions/side effects for all the Medicines you take and that have been prescribed to you. Take any new Medicines after you have completely understood and accept all the possible adverse reactions/side effects.   Please note  You were cared for by a hospitalist during your hospital stay. If you have any questions about your discharge medications or the care you received while you were in the hospital after you are discharged, you can call the unit and asked to speak with the hospitalist on call if the hospitalist that took care of you is not available. Once you are discharged, your primary care physician will handle any further medical issues. Please note that NO REFILLS for any discharge medications will be authorized once you are discharged, as it is imperative that you return to your primary care physician (or establish a relationship with a primary care physician if you do not have one) for your aftercare needs so that they can reassess your need for medications and monitor your lab values. Today   SUBJECTIVE   Feels a lot better  VITAL SIGNS:  Blood pressure 118/81, pulse 82, temperature 98 F (36.7 C), resp. rate 19, height 5\' 5"  (1.651 m), weight 89.7 kg, SpO2 94 %.  I/O:    Intake/Output Summary (Last 24 hours) at 03/06/2019 1001 Last  data filed at 03/06/2019 0550 Gross per 24 hour  Intake 720 ml  Output 1200 ml  Net -480 ml    PHYSICAL EXAMINATION:  GENERAL:  58 y.o.-year-old patient lying in the bed with no acute distress.obese  EYES: Pupils equal, round, reactive to light and accommodation. No scleral icterus. Extraocular muscles intact.  HEENT: Head atraumatic, normocephalic. Oropharynx and nasopharynx clear.  NECK:  Supple, no jugular venous distention. No thyroid enlargement, no tenderness.  LUNGS: Normal breath sounds bilaterally, no wheezing, rales,rhonchi or crepitation. No use of accessory muscles of respiration.  CARDIOVASCULAR: S1, S2 normal. No murmurs, rubs, or gallops.  ABDOMEN: Soft, non-tender, non-distended. Bowel sounds present. No organomegaly or mass.  EXTREMITIES: trace pedal edema, no cyanosis, or clubbing.  NEUROLOGIC: Cranial nerves II through XII are intact. Muscle strength 5/5 in all extremities. Sensation intact. Gait not checked.  PSYCHIATRIC:patient is alert and oriented x 3.  SKIN: No obvious rash, lesion, or ulcer.   DATA REVIEW:   CBC  Recent Labs  Lab 03/04/19 0602  WBC 7.2  HGB 16.0*  HCT 53.6*  PLT 160    Chemistries  Recent Labs  Lab 02/27/19  1404  03/02/19 0435  03/04/19 0602  NA 136   < > 136   < > 139  K 4.7   < > 4.9   < > 4.6  CL 89*   < > 85*   < > 86*  CO2 37*   < > 38*   < > 43*  GLUCOSE 110*   < > 148*   < > 137*  BUN 28*   < > 22*   < > 26*  CREATININE 0.86   < > 0.72   < > 0.68  CALCIUM 8.9   < > 8.9   < > 8.9  MG 2.3  --  2.6*  --   --   AST 27  --   --   --   --   ALT 48*  --   --   --   --   ALKPHOS 48  --   --   --   --   BILITOT 0.8  --   --   --   --    < > = values in this interval not displayed.    Microbiology Results   Recent Results (from the past 240 hour(s))  SARS Coronavirus 2 [facility] order, Performed in [facility] hospital lab) Nasopharyngeal Nasopharyngeal Swab     Status: None   Collection Time: 02/27/19  2:05 PM    Specimen: Nasopharyngeal Swab  Result Value Ref Range Status   SARS Coronavirus 2 NEGATIVE NEGATIVE Final    Comment: (NOTE) If result is NEGATIVE SARS-CoV-2 target nucleic acids are NOT DETECTED. The SARS-CoV-2 RNA is generally detectable in upper and lower  respiratory specimens during the acute phase of infection. The lowest  concentration of SARS-CoV-2 viral copies this assay can detect is 250  copies / mL. A negative result does not preclude SARS-CoV-2 infection  and should not be used as the sole basis for treatment or other  patient management decisions.  A negative result may occur with  improper specimen collection / handling, submission of specimen other  than nasopharyngeal swab, presence of viral mutation(s) within the  areas targeted by this assay, and inadequate number of viral copies  (<250 copies / mL). A negative result must be combined with clinical  observations, patient history, and epidemiological information. If result is POSITIVE SARS-CoV-2 target nucleic acids are DETECTED. The SARS-CoV-2 RNA is generally detectable in upper and lower  respiratory specimens dur ing the acute phase of infection.  Positive  results are indicative of active infection with SARS-CoV-2.  Clinical  correlation with patient history and other diagnostic information is  necessary to determine patient infection status.  Positive results do  not rule out bacterial infection or co-infection with other viruses. If result is PRESUMPTIVE POSTIVE SARS-CoV-2 nucleic acids MAY BE PRESENT.   A presumptive positive result was obtained on the submitted specimen  and confirmed on repeat testing.  While 2019 novel coronavirus  (SARS-CoV-2) nucleic acids may be present in the submitted sample  additional confirmatory testing may be necessary for epidemiological  and / or clinical management purposes  to differentiate between  SARS-CoV-2 and other Sarbecovirus currently known to infect humans.  If  clinically indicated additional testing with an alternate test  methodology [phone]) is advised. The SARS-CoV-2 RNA is generally  detectable in upper and lower respiratory sp ecimens during the acute  phase of infection. The expected result is Negative. Fact Sheet for Patients:  StrictlyIdeas.no Fact Sheet for Healthcare Providers: BankingDealers.co.za  This test is not yet approved or cleared by the [place] and has been authorized for detection and/or diagnosis of SARS-CoV-2 by FDA under an Emergency Use Authorization (EUA).  This EUA will remain in effect (meaning this test can be used) for the duration of the COVID-19 declaration under Section 564(b)(1) of the Act, 21 U.S.C. section 360bbb-3(b)(1), unless the authorization is terminated or revoked sooner. Performed at [facility], [address] [place], [place] [address]   MRSA PCR Screening     Status: None   Collection Time: 02/27/19  6:47 PM   Specimen: Nasal Mucosa; Nasopharyngeal  Result Value Ref Range Status   MRSA by PCR NEGATIVE NEGATIVE Final    Comment:        The GeneXpert MRSA Assay (FDA approved for NASAL specimens only), is one component of a comprehensive MRSA colonization surveillance program. It is not intended to diagnose MRSA infection nor to guide or monitor treatment for MRSA infections. Performed at [facility], [place], [place] [address]   SARS CORONAVIRUS 2 (TAT 6-24 HRS) Nasopharyngeal Nasopharyngeal Swab     Status: None   Collection Time: 03/03/19 11:45 AM   Specimen: Nasopharyngeal Swab  Result Value Ref Range Status   SARS Coronavirus 2 NEGATIVE NEGATIVE Final    Comment: (NOTE) SARS-CoV-2 target nucleic acids are NOT DETECTED. The SARS-CoV-2 RNA is generally detectable in upper and lower respiratory specimens during the acute phase of infection. Negative results do not preclude SARS-CoV-2  infection, do not rule out co-infections with other pathogens, and should not be used as the sole basis for treatment or other patient management decisions. Negative results must be combined with clinical observations, patient history, and epidemiological information. The expected result is Negative. Fact Sheet for Patients: SugarRoll.be Fact Sheet for Healthcare Providers: https://www.woods-mathews.com/ This test is not yet approved or cleared by the [place] FDA and  has been authorized for detection and/or diagnosis of SARS-CoV-2 by FDA under an Emergency Use Authorization (EUA). This EUA will remain  in effect (meaning this test can be used) for the duration of the COVID-19 declaration under Section 56 4(b)(1) of the Act, 21 U.S.C. section 360bbb-3(b)(1), unless the authorization is terminated or revoked sooner. Performed at [facility], [place] [address] [place], [place] [address]     RADIOLOGY:  No results found.   CODE STATUS:     Code Status Orders  (From admission, onward)         Start     Ordered   02/27/19 1845  Full code  Continuous     02/27/19 1844        Code Status History    Date Active Date Inactive Code Status Order ID Comments User Context   09/07/2016 1455 09/09/2016 1230 Full Code [id number]  Hillary Bow, MD Inpatient   Advance Care Planning Activity      TOTAL TIME TAKING CARE OF THIS PATIENT: *40* minutes.    Fritzi Mandes M.D on 03/06/2019 at 10:01 AM  Between 7am to 6pm - Pager - [phone] After 6pm go to [email or web address] - password [email or web address] [facility]  Office  [phone]  CC: Primary care physician; [name], NP

## 2019-03-06 NOTE — Progress Notes (Signed)
Patient ID: Shelly [name], female   DOB: [birth year], 58 y.o.   MRN: [id number]  Pt is required per LC to do another COVID. It was already done on Friday for d/c planning but  KELLY the supervisor says pt will not be aqccepted if there is no repeat on COVID test. This is a waste of resources and time!!!!

## 2019-03-06 NOTE — Care Management Important Message (Signed)
Important Message  Patient Details  Name: Shelly [name] MRN: [id number] Date of Birth: 1960-06-21   Medicare Important Message Given:  Yes     Dannette Barbara 03/06/2019, 12:11 PM

## 2019-03-07 DIAGNOSIS — J9611 Chronic respiratory failure with hypoxia: Secondary | ICD-10-CM | POA: Insufficient documentation

## 2019-03-07 DIAGNOSIS — I5032 Chronic diastolic (congestive) heart failure: Secondary | ICD-10-CM | POA: Insufficient documentation

## 2019-03-09 NOTE — Progress Notes (Deleted)
   Patient ID: Shelly [name], female    DOB: [birth year], 58 y.o.   MRN: [id number]  HPI  Shelly [name] is a 58 y/o female with a history of  Echo report from 02/28/2019 reviewed and showed an EF of 60-65% along with trace MR and trivial TR.  Admitted 02/27/2019 due to acute on chronic HF/COPD. Psychiatry consult obtained due to anxiety. UDS was positive for cannabinoid. Head CT was negative. COVID test negative. Initially given IV lasix and then transitioned to oral diuretics with resultant weight loss of ~ 9 kgs. Discharged after 7 days. Was in the ED 02/04/2019 due to migraine headache where she was treated and released.   She presents today for her initial visit with a chief complaint of   Review of Systems    Physical Exam    Assessment & Plan:  1: Chronic heart failure with preserved ejection fraction- - NYHA class - saw cardiology (Shelly [name]) 12/07/2018 - BNP 02/27/2019 was 1928.0  2: HTN- - BP - saw PCP - BMP from 03/04/2019 reviewed and showed sodium 139, potassium 4.6, creatinine 0.68 and GFR >60  3: COPD- - had telemedicine visit with pulmonology Shelly [name]) 11/07/2018

## 2019-03-10 ENCOUNTER — Telehealth: Payer: Self-pay | Admitting: Family

## 2019-03-10 ENCOUNTER — Ambulatory Visit: Payer: Medicare Other | Admitting: Family

## 2019-03-10 NOTE — Telephone Encounter (Signed)
Patient did not show for her [facility] appointment on 03/10/2019. Will attempt to reschedule.

## 2019-03-21 ENCOUNTER — Other Ambulatory Visit: Payer: Self-pay

## 2019-03-21 ENCOUNTER — Encounter: Payer: Self-pay | Admitting: Internal Medicine

## 2019-03-21 ENCOUNTER — Ambulatory Visit (INDEPENDENT_AMBULATORY_CARE_PROVIDER_SITE_OTHER): Payer: Medicare Other | Admitting: Internal Medicine

## 2019-03-21 VITALS — BP 100/60 | HR 91 | Temp 98.2°F | Resp 16 | Ht 65.5 in | Wt 196.0 lb

## 2019-03-21 DIAGNOSIS — Z9981 Dependence on supplemental oxygen: Secondary | ICD-10-CM | POA: Diagnosis not present

## 2019-03-21 DIAGNOSIS — J449 Chronic obstructive pulmonary disease, unspecified: Secondary | ICD-10-CM

## 2019-03-21 DIAGNOSIS — J988 Other specified respiratory disorders: Secondary | ICD-10-CM | POA: Diagnosis not present

## 2019-03-21 DIAGNOSIS — J9611 Chronic respiratory failure with hypoxia: Secondary | ICD-10-CM | POA: Diagnosis not present

## 2019-03-21 NOTE — Progress Notes (Signed)
[facility] [place], [place] [address]  Pulmonary Sleep Medicine   Office Visit Note  Patient Name: Shelly [name] DOB: 10-06-1960 MRN [id number]  Date of Service: 03/21/2019  Complaints/HPI: Pt is here for hospital follow up.  On 02/27/2019 she was feeling bad and having trouble staying awake.  She called 911, and does not remember anything after that. When she presented to the ER she had shortness of breath.  She was falling asleep per the ED notes, and her oxygen saturation was dropping to 82%.  Due to her AMS, she was placed on bipap and eventually intubated.  She was liberated from the vent a day later, and was discharged about 7 days later after her newly diagnosed PNA was cleared.   ROS  General: (-) fever, (-) chills, (-) night sweats, (-) weakness Skin: (-) rashes, (-) itching,. Eyes: (-) visual changes, (-) redness, (-) itching. Nose and Sinuses: (-) nasal stuffiness or itchiness, (-) postnasal drip, (-) nosebleeds, (-) sinus trouble. Mouth and Throat: (-) sore throat, (-) hoarseness. Neck: (-) swollen glands, (-) enlarged thyroid, (-) neck pain. Respiratory: - cough, (-) bloody sputum, - shortness of breath, - wheezing. Cardiovascular: - ankle swelling, (-) chest pain. Lymphatic: (-) lymph node enlargement. Neurologic: (-) numbness, (-) tingling. Psychiatric: (-) anxiety, (-) depression   Current Medication: Outpatient Encounter Medications as of 03/21/2019  Medication Sig  . albuterol (VENTOLIN HFA) 108 (90 Base) MCG/ACT inhaler Inhale 2 puffs into the lungs every 6 (six) hours as needed for wheezing or shortness of breath.  . baclofen (LIORESAL) 10 MG tablet Take 10 mg by mouth 3 (three) times daily.  . chlorpheniramine-HYDROcodone (TUSSIONEX PENNKINETIC ER) 10-8 MG/5ML SUER Take 5 mLs by mouth every 12 (twelve) hours as needed for cough. (Patient taking differently: Take 5 mLs by mouth at bedtime as needed for cough. )  . clonazePAM  (KLONOPIN) 0.5 MG tablet Take 1 tablet (0.5 mg total) by mouth 3 (three) times daily.  . DULoxetine (CYMBALTA) 30 MG capsule Take 1 capsule (30 mg total) by mouth 2 (two) times daily.  . fluticasone furoate-vilanterol (BREO ELLIPTA) 200-25 MCG/INH AEPB Inhale 1 puff into the lungs daily.  . Fluticasone-Salmeterol (ADVAIR DISKUS) 250-50 MCG/DOSE AEPB Inhale 1 puff into the lungs 2 (two) times daily.  . furosemide (LASIX) 20 MG tablet Take 1 tablet (20 mg total) by mouth 2 (two) times daily.  Marland Kitchen lisinopril (ZESTRIL) 20 MG tablet Take 20 mg by mouth daily.  . VENTOLIN HFA 108 (90 Base) MCG/ACT inhaler Inhale 2 puffs into the lungs every 6 (six) hours as needed.    No facility-administered encounter medications on file as of 03/21/2019.     Surgical History: Past Surgical History:  Procedure Laterality Date  . ABDOMINAL HYSTERECTOMY    . COLON SURGERY      Medical History: Past Medical History:  Diagnosis Date  . Asthma   . CHF (congestive heart failure) ([place])   . COPD (chronic obstructive pulmonary disease) ([place])   . Diabetes mellitus without complication ([place])   . Hypertension   . Sleep apnea     Family History: Family History  Adopted: Yes  Family history unknown: Yes    Social History: Social History   Socioeconomic History  . Marital status: Married    Spouse name: Not on file  . Number of children: Not on file  . Years of education: Not on file  . Highest education level: Not on file  Occupational History  .  Not on file  Social Needs  . Financial resource strain: Not on file  . Food insecurity    Worry: Not on file    Inability: Not on file  . Transportation needs    Medical: Not on file    Non-medical: Not on file  Tobacco Use  . Smoking status: Former Research scientist (life sciences)  . Smokeless tobacco: Never Used  Substance and Sexual Activity  . Alcohol use: No  . Drug use: No  . Sexual activity: Not on file  Lifestyle  . Physical activity    Days per week: Not on file     Minutes per session: Not on file  . Stress: Not on file  Relationships  . Social Herbalist on phone: Not on file    Gets together: Not on file    Attends religious service: Not on file    Active member of club or organization: Not on file    Attends meetings of clubs or organizations: Not on file    Relationship status: Not on file  . Intimate partner violence    Fear of current or ex partner: Not on file    Emotionally abused: Not on file    Physically abused: Not on file    Forced sexual activity: Not on file  Other Topics Concern  . Not on file  Social History Narrative  . Not on file    Vital Signs: Blood pressure 100/60, pulse 91, temperature 98.2 F (36.8 C), resp. rate 16, height 5' 5.5" (1.664 m), weight 196 lb (88.9 kg), SpO2 93 %.  Examination: General Appearance: The patient is well-developed, well-nourished, and in no distress. Skin: Gross inspection of skin unremarkable. Head: normocephalic, no gross deformities. Eyes: no gross deformities noted. ENT: ears appear grossly normal no exudates. Neck: Supple. No thyromegaly. No LAD. Respiratory: clear bilaterally. Cardiovascular: Normal S1 and S2 without murmur or rub. Extremities: No cyanosis. pulses are equal. Neurologic: Alert and oriented. No involuntary movements.  LABS: Recent Results (from the past 2160 hour(s))  CBC with Differential     Status: Abnormal   Collection Time: 02/04/19  2:59 PM  Result Value Ref Range   WBC 8.3 4.0 - 10.5 K/uL   RBC 5.51 (H) 3.87 - 5.11 MIL/uL   Hemoglobin 16.2 (H) 12.0 - 15.0 g/dL   HCT 53.7 (H) 36.0 - 46.0 %   MCV 97.5 80.0 - 100.0 fL   MCH 29.4 26.0 - 34.0 pg   MCHC 30.2 30.0 - 36.0 g/dL   RDW 13.7 11.5 - 15.5 %   Platelets 207 150 - 400 K/uL   nRBC 0.0 0.0 - 0.2 %   Neutrophils Relative % 66 %   Neutro Abs 5.5 1.7 - 7.7 K/uL   Lymphocytes Relative 24 %   Lymphs Abs 2.0 0.7 - 4.0 K/uL   Monocytes Relative 7 %   Monocytes Absolute 0.5 0.1 - 1.0 K/uL    Eosinophils Relative 2 %   Eosinophils Absolute 0.1 0.0 - 0.5 K/uL   Basophils Relative 1 %   Basophils Absolute 0.0 0.0 - 0.1 K/uL   Immature Granulocytes 0 %   Abs Immature Granulocytes 0.03 0.00 - 0.07 K/uL    Comment: Performed at [facility], [address], [place], [place] [id number]  Basic metabolic panel     Status: Abnormal   Collection Time: 02/04/19  2:59 PM  Result Value Ref Range   Sodium 138 135 - 145 mmol/L   Potassium 4.6 3.5 -  5.1 mmol/L   Chloride 95 (L) 98 - 111 mmol/L   CO2 36 (H) 22 - 32 mmol/L   Glucose, Bld 94 70 - 99 mg/dL   BUN 13 6 - 20 mg/dL   Creatinine, Ser 0.74 0.44 - 1.00 mg/dL   Calcium 8.9 8.9 - 10.3 mg/dL   GFR calc non Af Amer >60 >60 mL/min   GFR calc Af Amer >60 >60 mL/min   Anion gap 7 5 - 15    Comment: Performed at [facility], [address] [place], [place] [address]  Brain natriuretic peptide     Status: Abnormal   Collection Time: 02/04/19  2:59 PM  Result Value Ref Range   B Natriuretic Peptide 156.0 (H) 0.0 - 100.0 pg/mL    Comment: Performed at [facility], [place], [place] [address]  Troponin I (High Sensitivity)     Status: None   Collection Time: 02/04/19  2:59 PM  Result Value Ref Range   Troponin I (High Sensitivity) 7 <18 ng/L    Comment: (NOTE) Elevated high sensitivity troponin I (hsTnI) values and significant  changes across serial measurements may suggest ACS but many other  chronic and acute conditions are known to elevate hsTnI results.  Refer to the "Links" section for chest pain algorithms and additional  guidance. Performed at [facility], [address] [place], [place] [address]   CBC with Differential     Status: Abnormal   Collection Time: 02/27/19  2:04 PM  Result Value Ref Range   WBC 9.9 4.0 - 10.5 K/uL   RBC 5.58 (H) 3.87 - 5.11 MIL/uL   Hemoglobin 16.4 (H) 12.0 - 15.0 g/dL   HCT 53.2 (H) 36.0 - 46.0 %   MCV 95.3 80.0 - 100.0 fL   MCH 29.4  26.0 - 34.0 pg   MCHC 30.8 30.0 - 36.0 g/dL   RDW 15.1 11.5 - 15.5 %   Platelets 216 150 - 400 K/uL   nRBC 0.0 0.0 - 0.2 %   Neutrophils Relative % 75 %   Neutro Abs 7.3 1.7 - 7.7 K/uL   Lymphocytes Relative 18 %   Lymphs Abs 1.8 0.7 - 4.0 K/uL   Monocytes Relative 7 %   Monocytes Absolute 0.7 0.1 - 1.0 K/uL   Eosinophils Relative 0 %   Eosinophils Absolute 0.0 0.0 - 0.5 K/uL   Basophils Relative 0 %   Basophils Absolute 0.0 0.0 - 0.1 K/uL   Immature Granulocytes 0 %   Abs Immature Granulocytes 0.04 0.00 - 0.07 K/uL    Comment: Performed at [facility], [address] [place], [place] [address]  Comprehensive metabolic panel     Status: Abnormal   Collection Time: 02/27/19  2:04 PM  Result Value Ref Range   Sodium 136 135 - 145 mmol/L   Potassium 4.7 3.5 - 5.1 mmol/L   Chloride 89 (L) 98 - 111 mmol/L   CO2 37 (H) 22 - 32 mmol/L   Glucose, Bld 110 (H) 70 - 99 mg/dL   BUN 28 (H) 6 - 20 mg/dL   Creatinine, Ser 0.86 0.44 - 1.00 mg/dL   Calcium 8.9 8.9 - 10.3 mg/dL   Total Protein 6.3 (L) 6.5 - 8.1 g/dL   Albumin 3.6 3.5 - 5.0 g/dL   AST 27 15 - 41 U/L   ALT 48 (H) 0 - 44 U/L   Alkaline Phosphatase 48 38 - 126 U/L   Total Bilirubin 0.8  0.3 - 1.2 mg/dL   GFR calc non Af Amer >60 >60 mL/min   GFR calc Af Amer >60 >60 mL/min   Anion gap 10 5 - 15    Comment: Performed at [facility], [address] [place], [place] [address]  Magnesium     Status: None   Collection Time: 02/27/19  2:04 PM  Result Value Ref Range   Magnesium 2.3 1.7 - 2.4 mg/dL    Comment: Performed at [facility], [address] [place], [place] [address]  Procalcitonin - Baseline     Status: None   Collection Time: 02/27/19  2:04 PM  Result Value Ref Range   Procalcitonin <0.10 ng/mL    Comment:        Interpretation: PCT (Procalcitonin) <= 0.5 ng/mL: Systemic infection (sepsis) is not likely. Local bacterial infection is possible. (NOTE)       Sepsis PCT Algorithm            Lower Respiratory Tract                                      Infection PCT Algorithm    ----------------------------     ----------------------------         PCT < 0.25 ng/mL                PCT < 0.10 ng/mL         Strongly encourage             Strongly discourage   discontinuation of antibiotics    initiation of antibiotics    ----------------------------     -----------------------------       PCT 0.25 - 0.50 ng/mL            PCT 0.10 - 0.25 ng/mL               OR       >80% decrease in PCT            Discourage initiation of                                            antibiotics      Encourage discontinuation           of antibiotics    ----------------------------     -----------------------------         PCT >= 0.50 ng/mL              PCT 0.26 - 0.50 ng/mL               AND        <80% decrease in PCT             Encourage initiation of                                             antibiotics       Encourage continuation           of antibiotics    ----------------------------     -----------------------------        PCT >= 0.50 ng/mL  PCT > 0.50 ng/mL               AND         increase in PCT                  Strongly encourage                                      initiation of antibiotics    Strongly encourage escalation           of antibiotics                                     -----------------------------                                           PCT <= 0.25 ng/mL                                                 OR                                        > 80% decrease in PCT                                     Discontinue / Do not initiate                                             antibiotics Performed at [facility], [address], [place], [place] [address]   Brain natriuretic peptide     Status: Abnormal   Collection Time: 02/27/19  2:04 PM  Result Value Ref Range   B Natriuretic Peptide 1,928.0 (H) 0.0 - 100.0 pg/mL     Comment: Performed at [facility], [address] [place], [place] [address]  SARS Coronavirus 2 [facility] order, Performed in [facility] hospital lab) Nasopharyngeal Nasopharyngeal Swab     Status: None   Collection Time: 02/27/19  2:05 PM   Specimen: Nasopharyngeal Swab  Result Value Ref Range   SARS Coronavirus 2 NEGATIVE NEGATIVE    Comment: (NOTE) If result is NEGATIVE SARS-CoV-2 target nucleic acids are NOT DETECTED. The SARS-CoV-2 RNA is generally detectable in upper and lower  respiratory specimens during the acute phase of infection. The lowest  concentration of SARS-CoV-2 viral copies this assay can detect is 250  copies / mL. A negative result does not preclude SARS-CoV-2 infection  and should not be used as the sole basis for treatment or other  patient management decisions.  A negative result may occur with  improper specimen collection / handling, submission of specimen other  than nasopharyngeal swab, presence of viral mutation(s) within the  areas targeted by this assay, and inadequate number of viral copies  (<250 copies /  mL). A negative result must be combined with clinical  observations, patient history, and epidemiological information. If result is POSITIVE SARS-CoV-2 target nucleic acids are DETECTED. The SARS-CoV-2 RNA is generally detectable in upper and lower  respiratory specimens dur ing the acute phase of infection.  Positive  results are indicative of active infection with SARS-CoV-2.  Clinical  correlation with patient history and other diagnostic information is  necessary to determine patient infection status.  Positive results do  not rule out bacterial infection or co-infection with other viruses. If result is PRESUMPTIVE POSTIVE SARS-CoV-2 nucleic acids MAY BE PRESENT.   A presumptive positive result was obtained on the submitted specimen  and confirmed on repeat testing.  While 2019 novel coronavirus  (SARS-CoV-2) nucleic acids may be  present in the submitted sample  additional confirmatory testing may be necessary for epidemiological  and / or clinical management purposes  to differentiate between  SARS-CoV-2 and other Sarbecovirus currently known to infect humans.  If clinically indicated additional testing with an alternate test  methodology [phone]) is advised. The SARS-CoV-2 RNA is generally  detectable in upper and lower respiratory sp ecimens during the acute  phase of infection. The expected result is Negative. Fact Sheet for Patients:  StrictlyIdeas.no Fact Sheet for Healthcare Providers: BankingDealers.co.za This test is not yet approved or cleared by the [place] FDA and has been authorized for detection and/or diagnosis of SARS-CoV-2 by FDA under an Emergency Use Authorization (EUA).  This EUA will remain in effect (meaning this test can be used) for the duration of the COVID-19 declaration under Section 564(b)(1) of the Act, 21 U.S.C. section 360bbb-3(b)(1), unless the authorization is terminated or revoked sooner. Performed at [facility], [address] [place], [place] [address]   Blood gas, venous     Status: Abnormal   Collection Time: 02/27/19  2:16 PM  Result Value Ref Range   pH, Ven 7.31 7.250 - 7.430   pCO2, Ven 91 (HH) 44.0 - 60.0 mmHg    Comment: CRITICAL RESULT CALLED TO, READ BACK BY AND VERIFIED WITH:  RN LAURA AT 1430, 02/27/19, LS    pO2, Ven 57.0 (H) 32.0 - 45.0 mmHg   Bicarbonate 45.8 (H) 20.0 - 28.0 mmol/L   Acid-Base Excess 15.0 (H) 0.0 - 2.0 mmol/L   O2 Saturation 86.4 %   Patient temperature 37.0    Collection site VENOUS    Sample type VENOUS     Comment: Performed at [facility], [address] [place], [place] [address]  Glucose, capillary     Status: Abnormal   Collection Time: 02/27/19  4:25 PM  Result Value Ref Range   Glucose-Capillary 143 (H) 70 - 99 mg/dL  Urine Drug Screen, Qualitative  ([facility] only)     Status: Abnormal   Collection Time: 02/27/19  4:42 PM  Result Value Ref Range   Tricyclic, Ur Screen NONE DETECTED NONE DETECTED   Amphetamines, Ur Screen NONE DETECTED NONE DETECTED   MDMA (Ecstasy)Ur Screen NONE DETECTED NONE DETECTED   Cocaine Metabolite,Ur [place] NONE DETECTED NONE DETECTED   Opiate, Ur Screen NONE DETECTED NONE DETECTED   Phencyclidine (PCP) Ur S NONE DETECTED NONE DETECTED   Cannabinoid 50 Ng, Ur [place] POSITIVE (A) NONE DETECTED   Barbiturates, Ur Screen NONE DETECTED NONE DETECTED   Benzodiazepine, Ur Scrn NONE DETECTED NONE DETECTED   Methadone Scn, Ur NONE DETECTED NONE DETECTED    Comment: (NOTE) Tricyclics + metabolites, urine    Cutoff 1000 ng/mL  Amphetamines + metabolites, urine  Cutoff 1000 ng/mL MDMA (Ecstasy), urine              Cutoff 500 ng/mL Cocaine Metabolite, urine          Cutoff 300 ng/mL Opiate + metabolites, urine        Cutoff 300 ng/mL Phencyclidine (PCP), urine         Cutoff 25 ng/mL Cannabinoid, urine                 Cutoff 50 ng/mL Barbiturates + metabolites, urine  Cutoff 200 ng/mL Benzodiazepine, urine              Cutoff 200 ng/mL Methadone, urine                   Cutoff 300 ng/mL The urine drug screen provides only a preliminary, unconfirmed analytical test result and should not be used for non-medical purposes. Clinical consideration and professional judgment should be applied to any positive drug screen result due to possible interfering substances. A more specific alternate chemical method must be used in order to obtain a confirmed analytical result. Gas chromatography / mass spectrometry (GC/MS) is the preferred confirmat ory method. Performed at [facility], [address] [place], [place] [address]   Blood gas, arterial     Status: Abnormal   Collection Time: 02/27/19  4:58 PM  Result Value Ref Range   FIO2 0.50    Delivery systems VENTILATOR    Mode ASSIST CONTROL    VT 500 mL   LHR 22  resp/min   Peep/cpap 5.0 cm H20   pH, Arterial 7.36 7.350 - 7.450   pCO2 arterial 66 (HH) 32.0 - 48.0 mmHg    Comment: CRITICAL RESULT CALLED TO, READ BACK BY AND VERIFIED WITH:  FUNKE AT 1728, 02/27/2019, LS    pO2, Arterial 68 (L) 83.0 - 108.0 mmHg   Bicarbonate 37.3 (H) 20.0 - 28.0 mmol/L   Acid-Base Excess 9.2 (H) 0.0 - 2.0 mmol/L   O2 Saturation 92.5 %   Patient temperature 37.0    Collection site LEFT BRACHIAL    Sample type ARTERIAL DRAW    Allens test (pass/fail) PASS PASS    Comment: Performed at [facility], [address] [place], [place] [address]  Ethanol     Status: None   Collection Time: 02/27/19  5:00 PM  Result Value Ref Range   Alcohol, Ethyl (B) <10 <10 mg/dL    Comment: (NOTE) Lowest detectable limit for serum alcohol is 10 mg/dL. For medical purposes only. Performed at [facility], [address] [place], [place] [address]   Triglycerides     Status: Abnormal   Collection Time: 02/27/19  5:00 PM  Result Value Ref Range   Triglycerides 196 (H) <150 mg/dL    Comment: Performed at [facility], [address] [place], [place] [address]  Glucose, capillary     Status: Abnormal   Collection Time: 02/27/19  6:44 PM  Result Value Ref Range   Glucose-Capillary 135 (H) 70 - 99 mg/dL  MRSA PCR Screening     Status: None   Collection Time: 02/27/19  6:47 PM   Specimen: Nasal Mucosa; Nasopharyngeal  Result Value Ref Range   MRSA by PCR NEGATIVE NEGATIVE    Comment:        The GeneXpert MRSA Assay (FDA approved for NASAL specimens only), is one component of a comprehensive MRSA colonization surveillance program. It is not  intended to diagnose MRSA infection nor to guide or monitor treatment for MRSA infections. Performed at [facility], [address] [place], [place] [address]   Blood gas, arterial     Status: Abnormal   Collection Time: 02/28/19  4:57 AM  Result Value Ref Range   FIO2 0.40    Delivery  systems VENTILATOR    Mode PRESSURE REGULATED VOLUME CONTROL    VT 500 mL   Peep/cpap 5.0 cm H20   pH, Arterial 7.42 7.350 - 7.450   pCO2 arterial 69 (HH) 32.0 - 48.0 mmHg    Comment: CRITICAL RESULT, NOTIFIED PHYSICIAN NP.DANA BLAKENEY AT 0515 ON 02/28/2019 KSL    pO2, Arterial 54 (L) 83.0 - 108.0 mmHg   Bicarbonate 44.8 (H) 20.0 - 28.0 mmol/L   Acid-Base Excess 16.6 (H) 0.0 - 2.0 mmol/L   O2 Saturation 88.2 %   Patient temperature 37.0    Collection site LEFT RADIAL    Sample type ARTERIAL DRAW    Allens test (pass/fail) PASS PASS   Mechanical Rate 15     Comment: Performed at [facility], [address] [place], [place] [id number]  Basic metabolic panel     Status: Abnormal   Collection Time: 02/28/19  5:14 AM  Result Value Ref Range   Sodium 138 135 - 145 mmol/L   Potassium 4.2 3.5 - 5.1 mmol/L   Chloride 92 (L) 98 - 111 mmol/L   CO2 35 (H) 22 - 32 mmol/L   Glucose, Bld 178 (H) 70 - 99 mg/dL   BUN 23 (H) 6 - 20 mg/dL   Creatinine, Ser 0.91 0.44 - 1.00 mg/dL   Calcium 8.9 8.9 - 10.3 mg/dL   GFR calc non Af Amer >60 >60 mL/min   GFR calc Af Amer >60 >60 mL/min   Anion gap 11 5 - 15    Comment: Performed at [facility], [address] [place], [place] [address]  CBC     Status: Abnormal   Collection Time: 02/28/19  5:14 AM  Result Value Ref Range   WBC 6.6 4.0 - 10.5 K/uL   RBC 5.86 (H) 3.87 - 5.11 MIL/uL   Hemoglobin 17.4 (H) 12.0 - 15.0 g/dL    Comment: REPEATED TO VERIFY   HCT 54.9 (H) 36.0 - 46.0 %   MCV 93.7 80.0 - 100.0 fL   MCH 29.7 26.0 - 34.0 pg   MCHC 31.7 30.0 - 36.0 g/dL   RDW 14.8 11.5 - 15.5 %   Platelets 207 150 - 400 K/uL   nRBC 0.0 0.0 - 0.2 %    Comment: Performed at [facility], [address] [place], [place] [address]  HIV Antibody (routine testing w rflx)     Status: None   Collection Time: 02/28/19  5:14 AM  Result Value Ref Range   HIV Screen 4th Generation wRfx Non Reactive Non Reactive    Comment:  (NOTE) Performed At: [facility] [address] [place], [place] [id number] Rush Farmer MD [id number]   ECHOCARDIOGRAM COMPLETE     Status: None   Collection Time: 02/28/19  9:36 AM  Result Value Ref Range   Weight 3,400.38 oz   Height 65 in   BP 90/53 mmHg  Basic metabolic panel     Status: Abnormal   Collection Time: 03/01/19  5:59 AM  Result Value Ref Range   Sodium 138 135 - 145 mmol/L   Potassium 4.8 3.5 - 5.1 mmol/L   Chloride  92 (L) 98 - 111 mmol/L   CO2 37 (H) 22 - 32 mmol/L   Glucose, Bld 154 (H) 70 - 99 mg/dL   BUN 25 (H) 6 - 20 mg/dL   Creatinine, Ser 0.74 0.44 - 1.00 mg/dL   Calcium 8.9 8.9 - 10.3 mg/dL   GFR calc non Af Amer >60 >60 mL/min   GFR calc Af Amer >60 >60 mL/min   Anion gap 9 5 - 15    Comment: Performed at [facility], [address] [place], [place] [address]  CBC     Status: Abnormal   Collection Time: 03/01/19  5:59 AM  Result Value Ref Range   WBC 10.6 (H) 4.0 - 10.5 K/uL   RBC 5.36 (H) 3.87 - 5.11 MIL/uL   Hemoglobin 15.4 (H) 12.0 - 15.0 g/dL   HCT 50.7 (H) 36.0 - 46.0 %   MCV 94.6 80.0 - 100.0 fL   MCH 28.7 26.0 - 34.0 pg   MCHC 30.4 30.0 - 36.0 g/dL   RDW 15.1 11.5 - 15.5 %   Platelets 173 150 - 400 K/uL   nRBC 0.0 0.0 - 0.2 %    Comment: Performed at [facility], [address] [place], [place] [id number]  Basic metabolic panel     Status: Abnormal   Collection Time: 03/02/19  4:35 AM  Result Value Ref Range   Sodium 136 135 - 145 mmol/L   Potassium 4.9 3.5 - 5.1 mmol/L   Chloride 85 (L) 98 - 111 mmol/L   CO2 38 (H) 22 - 32 mmol/L   Glucose, Bld 148 (H) 70 - 99 mg/dL   BUN 22 (H) 6 - 20 mg/dL   Creatinine, Ser 0.72 0.44 - 1.00 mg/dL   Calcium 8.9 8.9 - 10.3 mg/dL   GFR calc non Af Amer >60 >60 mL/min   GFR calc Af Amer >60 >60 mL/min   Anion gap 13 5 - 15    Comment: Performed at [facility], [address] [place], [place] [address]  Magnesium     Status: Abnormal   Collection  Time: 03/02/19  4:35 AM  Result Value Ref Range   Magnesium 2.6 (H) 1.7 - 2.4 mg/dL    Comment: Performed at [facility], [address] [place], [place] [address]  CBC with Differential/Platelet     Status: Abnormal   Collection Time: 03/02/19  4:35 AM  Result Value Ref Range   WBC 8.5 4.0 - 10.5 K/uL   RBC 5.46 (H) 3.87 - 5.11 MIL/uL   Hemoglobin 15.8 (H) 12.0 - 15.0 g/dL   HCT 51.0 (H) 36.0 - 46.0 %   MCV 93.4 80.0 - 100.0 fL   MCH 28.9 26.0 - 34.0 pg   MCHC 31.0 30.0 - 36.0 g/dL   RDW 14.8 11.5 - 15.5 %   Platelets 187 150 - 400 K/uL   nRBC 0.0 0.0 - 0.2 %   Neutrophils Relative % 91 %   Neutro Abs 7.7 1.7 - 7.7 K/uL   Lymphocytes Relative 4 %   Lymphs Abs 0.3 (L) 0.7 - 4.0 K/uL   Monocytes Relative 4 %   Monocytes Absolute 0.4 0.1 - 1.0 K/uL   Eosinophils Relative 0 %   Eosinophils Absolute 0.0 0.0 - 0.5 K/uL   Basophils Relative 0 %   Basophils Absolute 0.0 0.0 - 0.1 K/uL   Immature Granulocytes 1 %   Abs Immature Granulocytes 0.04 0.00 - 0.07 K/uL  Comment: Performed at [facility], [address] [place], [place] [address]  CBC     Status: Abnormal   Collection Time: 03/03/19  6:32 AM  Result Value Ref Range   WBC 8.2 4.0 - 10.5 K/uL   RBC 5.54 (H) 3.87 - 5.11 MIL/uL   Hemoglobin 16.1 (H) 12.0 - 15.0 g/dL   HCT 52.4 (H) 36.0 - 46.0 %   MCV 94.6 80.0 - 100.0 fL   MCH 29.1 26.0 - 34.0 pg   MCHC 30.7 30.0 - 36.0 g/dL   RDW 14.6 11.5 - 15.5 %   Platelets 177 150 - 400 K/uL   nRBC 0.0 0.0 - 0.2 %    Comment: Performed at [facility], [address] [place], [place] [id number]  Basic metabolic panel     Status: Abnormal   Collection Time: 03/03/19  6:32 AM  Result Value Ref Range   Sodium 137 135 - 145 mmol/L   Potassium 5.1 3.5 - 5.1 mmol/L   Chloride 86 (L) 98 - 111 mmol/L   CO2 41 (H) 22 - 32 mmol/L   Glucose, Bld 150 (H) 70 - 99 mg/dL   BUN 27 (H) 6 - 20 mg/dL   Creatinine, Ser 0.74 0.44 - 1.00 mg/dL   Calcium 8.8 (L)  8.9 - 10.3 mg/dL   GFR calc non Af Amer >60 >60 mL/min   GFR calc Af Amer >60 >60 mL/min   Anion gap 10 5 - 15    Comment: Performed at [facility], [address] [place], [place] [address]  SARS CORONAVIRUS 2 (TAT 6-24 HRS) Nasopharyngeal Nasopharyngeal Swab     Status: None   Collection Time: 03/03/19 11:45 AM   Specimen: Nasopharyngeal Swab  Result Value Ref Range   SARS Coronavirus 2 NEGATIVE NEGATIVE    Comment: (NOTE) SARS-CoV-2 target nucleic acids are NOT DETECTED. The SARS-CoV-2 RNA is generally detectable in upper and lower respiratory specimens during the acute phase of infection. Negative results do not preclude SARS-CoV-2 infection, do not rule out co-infections with other pathogens, and should not be used as the sole basis for treatment or other patient management decisions. Negative results must be combined with clinical observations, patient history, and epidemiological information. The expected result is Negative. Fact Sheet for Patients: SugarRoll.be Fact Sheet for Healthcare Providers: https://www.woods-mathews.com/ This test is not yet approved or cleared by the [place] FDA and  has been authorized for detection and/or diagnosis of SARS-CoV-2 by FDA under an Emergency Use Authorization (EUA). This EUA will remain  in effect (meaning this test can be used) for the duration of the COVID-19 declaration under Section 56 4(b)(1) of the Act, 21 U.S.C. section 360bbb-3(b)(1), unless the authorization is terminated or revoked sooner. Performed at [facility], [place] [address], [place],  [address]   CBC     Status: Abnormal   Collection Time: 03/04/19  6:02 AM  Result Value Ref Range   WBC 7.2 4.0 - 10.5 K/uL   RBC 5.59 (H) 3.87 - 5.11 MIL/uL   Hemoglobin 16.0 (H) 12.0 - 15.0 g/dL   HCT 53.6 (H) 36.0 - 46.0 %   MCV 95.9 80.0 - 100.0 fL   MCH 28.6 26.0 - 34.0 pg   MCHC 29.9 (L) 30.0 - 36.0  g/dL   RDW 14.5 11.5 - 15.5 %   Platelets 160 150 - 400 K/uL   nRBC 0.0 0.0 - 0.2 %    Comment: Performed at [facility], [address] [place], [place] [id number]  Basic metabolic panel     Status: Abnormal   Collection Time: 03/04/19  6:02 AM  Result Value Ref Range   Sodium 139 135 - 145 mmol/L   Potassium 4.6 3.5 - 5.1 mmol/L   Chloride 86 (L) 98 - 111 mmol/L   CO2 43 (H) 22 - 32 mmol/L   Glucose, Bld 137 (H) 70 - 99 mg/dL   BUN 26 (H) 6 - 20 mg/dL   Creatinine, Ser 0.68 0.44 - 1.00 mg/dL   Calcium 8.9 8.9 - 10.3 mg/dL   GFR calc non Af Amer >60 >60 mL/min   GFR calc Af Amer >60 >60 mL/min   Anion gap 10 5 - 15    Comment: Performed at [facility], [address] [place], [place] [address]  Glucose, capillary     Status: Abnormal   Collection Time: 03/04/19  4:26 PM  Result Value Ref Range   Glucose-Capillary 295 (H) 70 - 99 mg/dL   Comment 1 Notify RN   SARS CORONAVIRUS 2 (TAT 6-24 HRS) Nasopharyngeal Nasopharyngeal Swab     Status: None   Collection Time: 03/06/19 10:10 AM   Specimen: Nasopharyngeal Swab  Result Value Ref Range   SARS Coronavirus 2 NEGATIVE NEGATIVE    Comment: (NOTE) SARS-CoV-2 target nucleic acids are NOT DETECTED. The SARS-CoV-2 RNA is generally detectable in upper and lower respiratory specimens during the acute phase of infection. Negative results do not preclude SARS-CoV-2 infection, do not rule out co-infections with other pathogens, and should not be used as the sole basis for treatment or other patient management decisions. Negative results must be combined with clinical observations, patient history, and epidemiological information. The expected result is Negative. Fact Sheet for Patients: SugarRoll.be Fact Sheet for Healthcare Providers: https://www.woods-mathews.com/ This test is not yet approved or cleared by the [place] FDA and  has been authorized for detection  and/or diagnosis of SARS-CoV-2 by FDA under an Emergency Use Authorization (EUA). This EUA will remain  in effect (meaning this test can be used) for the duration of the COVID-19 declaration under Section 56 4(b)(1) of the Act, 21 U.S.C. section 360bbb-3(b)(1), unless the authorization is terminated or revoked sooner. Performed at [facility], [place] [address], [place], [place] [address]     Radiology: No results found.  No results found.  Dg Chest 1 View  Result Date: 02/27/2019 CLINICAL DATA:  Shortness of breath EXAM: CHEST  1 VIEW COMPARISON:  February 04, 2019 FINDINGS: There is no edema or consolidation. Heart is mildly enlarged with mild pulmonary venous hypertension. No adenopathy. There is aortic atherosclerosis. No bone lesions. IMPRESSION: Mild cardiac enlargement with pulmonary vascular congestion. No edema or consolidation. Aortic Atherosclerosis (ICD10-I70.0). Electronically Signed   By: Lowella Grip III M.D.   On: 02/27/2019 14:18   Ct Head Wo Contrast  Result Date: 02/27/2019 CLINICAL DATA:  Altered level of consciousness EXAM: CT HEAD WITHOUT CONTRAST TECHNIQUE: Contiguous axial images were obtained from the base of the skull through the vertex without intravenous contrast. COMPARISON:  12/12/2017 FINDINGS: Brain: No acute intracranial abnormality. Specifically, no hemorrhage, hydrocephalus, mass lesion, acute infarction, or significant intracranial injury. Vascular: No hyperdense vessel or unexpected calcification. Skull: No acute calvarial abnormality. Sinuses/Orbits: Visualized paranasal sinuses and mastoids clear. Orbital soft tissues unremarkable. Other: None IMPRESSION: Normal study. Electronically Signed   By: Rolm Baptise M.D.   On: 02/27/2019 18:01   US Venous Img Lower Unilateral Left  Result Date: 03/02/2019 CLINICAL DATA:  Left lower extremity pain and edema. EXAM: LEFT LOWER EXTREMITY VENOUS DOPPLER ULTRASOUND TECHNIQUE: Gray-scale sonography with  graded compression, as well as color Doppler and duplex ultrasound were performed to evaluate the lower extremity deep venous systems from the level of the common femoral vein and including the common femoral, femoral, profunda femoral, popliteal and calf veins including the posterior tibial, peroneal and gastrocnemius veins when visible. The superficial great saphenous vein was also interrogated. Spectral Doppler was utilized to evaluate flow at rest and with distal augmentation maneuvers in the common femoral, femoral and popliteal veins. COMPARISON:  None. FINDINGS: Contralateral Common Femoral Vein: Respiratory phasicity is normal and symmetric with the symptomatic side. No evidence of thrombus. Normal compressibility. Common Femoral Vein: No evidence of thrombus. Normal compressibility, respiratory phasicity and response to augmentation. Saphenofemoral Junction: No evidence of thrombus. Normal compressibility and flow on color Doppler imaging. Profunda Femoral Vein: No evidence of thrombus. Normal compressibility and flow on color Doppler imaging. Femoral Vein: No evidence of thrombus. Normal compressibility, respiratory phasicity and response to augmentation. Popliteal Vein: No evidence of thrombus. Normal compressibility, respiratory phasicity and response to augmentation. Calf Veins: No evidence of thrombus. Normal compressibility and flow on color Doppler imaging. Superficial Great Saphenous Vein: No evidence of thrombus. Normal compressibility. Venous Reflux:  None. Other Findings: No evidence of superficial thrombophlebitis or abnormal fluid collection. IMPRESSION: No evidence of left lower extremity deep venous thrombosis. Electronically Signed   By: Aletta Edouard M.D.   On: 03/02/2019 08:51   Dg Chest Portable 1 View  Result Date: 02/27/2019 CLINICAL DATA:  Respiratory failure EXAM: PORTABLE CHEST 1 VIEW COMPARISON:  Same-day radiograph FINDINGS: Interval placement of endotracheal tube with distal  tip terminating approximately 3.8 cm superior to the carina. Enteric tube courses below the diaphragm with distal tip beyond the inferior margin of the film. Stable cardiomediastinal contours. Calcific aortic knob. Chronic mild pulmonary vascular congestion. Linear bibasilar opacities, favor atelectasis. No focal airspace consolidation, pleural effusion, or pneumothorax. IMPRESSION: Satisfactory positioning of endotracheal and enteric tubes. Electronically Signed   By: Davina Poke M.D.   On: 02/27/2019 17:12   Dg Chest Portable 1 View  Result Date: 02/27/2019 CLINICAL DATA:  Worsening shortness of breath for 1 week. History of asthma, congestive heart failure and diabetes. EXAM: PORTABLE CHEST 1 VIEW COMPARISON:  Radiographs 02/27/2019 and 02/04/2019. FINDINGS: 1614 hours. Lower lung volumes with resulting bibasilar atelectasis. There is no confluent airspace opacity, pleural effusion, edema or pneumothorax. There is mild vascular congestion. The heart size and mediastinal contours are stable with aortic atherosclerosis. The bones appear unchanged. IMPRESSION: Lower lung volumes with resulting mild bibasilar atelectasis. Otherwise stable chest with chronic pulmonary vascular congestion. Electronically Signed   By: Richardean Sale M.D.   On: 02/27/2019 16:39      Assessment and Plan: Patient Active Problem List   Diagnosis Date Noted  . Generalized anxiety disorder 03/02/2019  . Acute on chronic respiratory failure with hypoxia and hypercapnia ([place]) 02/27/2019  . DM (diabetes mellitus), type 2 ([place]) 09/08/2016  . COPD exacerbation ([place]) 09/07/2016  . Neck pain 04/02/2016  . Abdominal pain 01/04/2014    1. Obstructive chronic bronchitis without exacerbation ([place]) Continue to use inhalers and other medications as directed.   2. Respiratory infection Appears to be resolving, lungs are clear today, pt feeling better.   3. Chronic respiratory failure with hypoxia (H[facility]) Continue current  medications, and use oxygen as prescribed.   4. Oxygen dependent Continue  to use oxygen 2 lpm continually.   5. Morbid obesity ([place]) Obesity Counseling: Risk Assessment: An assessment of behavioral risk factors was made today and includes lack of exercise sedentary lifestyle, lack of portion control and poor dietary habits.  Risk Modification Advice: She was counseled on portion control guidelines. Restricting daily caloric intake to. . The detrimental long term effects of obesity on her health and ongoing poor compliance was also discussed with the patient.    General Counseling: I have discussed the findings of the evaluation and examination with Thayer Headings.  I have also discussed any further diagnostic evaluation thatmay be needed or ordered today. Danice verbalizes understanding of the findings of todays visit. We also reviewed her medications today and discussed drug interactions and side effects including but not limited excessive drowsiness and altered mental states. We also discussed that there is always a risk not just to her but also people around her. she has been encouraged to call the office with any questions or concerns that should arise related to todays visit.    Time spent: 25 This patient was seen by Orson Gear AGNP-C in Collaboration with Dr. Devona Konig as a part of collaborative care agreement.   I have personally obtained a history, examined the patient, evaluated laboratory and imaging results, formulated the assessment and plan and placed orders.    [name], MD [facility] Sleep medicine

## 2019-04-06 ENCOUNTER — Emergency Department
Admission: EM | Admit: 2019-04-06 | Discharge: 2019-04-06 | Disposition: A | Discharge status: Home / Self Care | Payer: Medicare Other | Attending: Emergency Medicine | Admitting: Emergency Medicine

## 2019-04-06 ENCOUNTER — Encounter: Payer: Self-pay | Admitting: Emergency Medicine

## 2019-04-06 ENCOUNTER — Emergency Department: Payer: Medicare Other

## 2019-04-06 ENCOUNTER — Other Ambulatory Visit: Payer: Self-pay

## 2019-04-06 DIAGNOSIS — J449 Chronic obstructive pulmonary disease, unspecified: Secondary | ICD-10-CM | POA: Insufficient documentation

## 2019-04-06 DIAGNOSIS — Z79899 Other long term (current) drug therapy: Secondary | ICD-10-CM | POA: Insufficient documentation

## 2019-04-06 DIAGNOSIS — I878 Other specified disorders of veins: Secondary | ICD-10-CM | POA: Insufficient documentation

## 2019-04-06 DIAGNOSIS — L03115 Cellulitis of right lower limb: Secondary | ICD-10-CM | POA: Diagnosis not present

## 2019-04-06 DIAGNOSIS — E119 Type 2 diabetes mellitus without complications: Secondary | ICD-10-CM | POA: Diagnosis not present

## 2019-04-06 DIAGNOSIS — Z87891 Personal history of nicotine dependence: Secondary | ICD-10-CM | POA: Insufficient documentation

## 2019-04-06 DIAGNOSIS — I509 Heart failure, unspecified: Secondary | ICD-10-CM | POA: Diagnosis not present

## 2019-04-06 DIAGNOSIS — R600 Localized edema: Secondary | ICD-10-CM | POA: Diagnosis present

## 2019-04-06 DIAGNOSIS — I110 Hypertensive heart disease with heart failure: Secondary | ICD-10-CM | POA: Diagnosis not present

## 2019-04-06 LAB — BASIC METABOLIC PANEL
Anion gap: 14 (ref 5–15)
BUN: 10 mg/dL (ref 6–20)
CO2: 32 mmol/L (ref 22–32)
Calcium: 9.4 mg/dL (ref 8.9–10.3)
Chloride: 92 mmol/L — ABNORMAL LOW (ref 98–111)
Creatinine, Ser: 0.89 mg/dL (ref 0.44–1.00)
GFR calc Af Amer: >60 mL/min (ref >60)
GFR calc non Af Amer: >60 mL/min (ref >60)
Glucose, Bld: 235 mg/dL — ABNORMAL HIGH (ref 70–99)
Potassium: 3.7 mmol/L (ref 3.5–5.1)
Sodium: 138 mmol/L (ref 135–145)

## 2019-04-06 LAB — CBC
HCT: 49.2 % — ABNORMAL HIGH (ref 36.0–46.0)
Hemoglobin: 16 g/dL — ABNORMAL HIGH (ref 12.0–15.0)
MCH: 29 pg (ref 26.0–34.0)
MCHC: 32.5 g/dL (ref 30.0–36.0)
MCV: 89.1 fL (ref 80.0–100.0)
Platelets: 196 10*3/uL (ref 150–400)
RBC: 5.52 MIL/uL — ABNORMAL HIGH (ref 3.87–5.11)
RDW: 13.6 % (ref 11.5–15.5)
WBC: 9.2 10*3/uL (ref 4.0–10.5)
nRBC: 0 % (ref 0.0–0.2)

## 2019-04-06 LAB — BRAIN NATRIURETIC PEPTIDE: B Natriuretic Peptide: 48 pg/mL (ref 0.0–100.0)

## 2019-04-06 MED ORDER — DOXYCYCLINE HYCLATE 100 MG PO TABS
100.0000 mg | ORAL_TABLET | Freq: Once | ORAL | Status: AC
  Administered 2019-04-06: 100 mg via ORAL
  Filled 2019-04-06: qty 1

## 2019-04-06 MED ORDER — DOXYCYCLINE HYCLATE 100 MG PO CAPS: 100.0000 mg | ORAL_CAPSULE | Freq: Two times a day (BID) | ORAL | 0 refills | Status: AC

## 2019-04-06 NOTE — ED Provider Notes (Signed)
[facility] Emergency Department Provider Note   ____________________________________________   First MD Initiated Contact with Patient 04/06/19 1816     (approximate)  I have reviewed the triage vital signs and the nursing notes.   HISTORY  Chief Complaint Leg Swelling    HPI Shelly [name] is a 57 y.o. female with past medical history of COPD on 2 L nasal cannula who presents to the ED complaining of leg swelling and pain.  Patient reports she has had increasing swelling and discomfort to her bilateral lower extremities over about the past week.  She was recently admitted for difficulty breathing and returned home from rehab about 1 week ago.  She is visited by home health 2 times a week and they were concerned when they noted increased swelling and redness to her lower extremities.  Patient describes pain to both lower extremities, right greater than left.  She denies any difficulty breathing and states her breathing has been doing much better since she left the hospital, continues to use her usual 3 L nasal cannula.  She has not had any fevers and has not noticed any drainage from her legs.  She reports being told that the veins in her legs were not working like they should, but she does not currently wear compression stockings.        Past Medical History:  Diagnosis Date  . Asthma   . CHF (congestive heart failure) ([place])   . COPD (chronic obstructive pulmonary disease) ([place])   . Diabetes mellitus without complication ([place])   . Hypertension   . Sleep apnea     Patient Active Problem List   Diagnosis Date Noted  . Generalized anxiety disorder 03/02/2019  . Acute on chronic respiratory failure with hypoxia and hypercapnia ([place]) 02/27/2019  . DM (diabetes mellitus), type 2 ([place]) 09/08/2016  . COPD exacerbation ([place]) 09/07/2016  . Neck pain 04/02/2016  . Abdominal pain 01/04/2014    Past Surgical History:  Procedure Laterality Date  . ABDOMINAL  HYSTERECTOMY    . COLON SURGERY      Prior to Admission medications   Medication Sig Start Date End Date Taking? Authorizing Provider  albuterol (VENTOLIN HFA) 108 (90 Base) MCG/ACT inhaler Inhale 2 puffs into the lungs every 6 (six) hours as needed for wheezing or shortness of breath. 03/06/19   Fritzi Mandes, MD  baclofen (LIORESAL) 10 MG tablet Take 10 mg by mouth 3 (three) times daily. 01/13/19   [provider]  chlorpheniramine-HYDROcodone (TUSSIONEX PENNKINETIC ER) 10-8 MG/5ML SUER Take 5 mLs by mouth every 12 (twelve) hours as needed for cough. Patient taking differently: Take 5 mLs by mouth at bedtime as needed for cough.  12/07/18   Kendell Bane, NP  clonazePAM (KLONOPIN) 0.5 MG tablet Take 1 tablet (0.5 mg total) by mouth 3 (three) times daily. 03/03/19   Max Sane, MD  doxycycline (VIBRAMYCIN) 100 MG capsule Take 1 capsule (100 mg total) by mouth 2 (two) times daily for 7 days. 04/06/19 04/13/19  Blake Divine, MD  DULoxetine (CYMBALTA) 30 MG capsule Take 1 capsule (30 mg total) by mouth 2 (two) times daily. 03/03/19   Max Sane, MD  fluticasone furoate-vilanterol (BREO ELLIPTA) 200-25 MCG/INH AEPB Inhale 1 puff into the lungs daily. 09/09/16   Fritzi Mandes, MD  Fluticasone-Salmeterol (ADVAIR DISKUS) 250-50 MCG/DOSE AEPB Inhale 1 puff into the lungs 2 (two) times daily. 03/06/19 03/05/20  Fritzi Mandes, MD  furosemide (LASIX) 20 MG tablet Take 1 tablet (20  mg total) by mouth 2 (two) times daily. 03/06/19   Fritzi Mandes, MD  lisinopril (ZESTRIL) 20 MG tablet Take 20 mg by mouth daily. 01/04/19   [provider]  VENTOLIN HFA 108 (90 Base) MCG/ACT inhaler Inhale 2 puffs into the lungs every 6 (six) hours as needed.  08/11/17   [provider]    Allergies Patient has no known allergies.  Family History  Adopted: Yes  Family history unknown: Yes    Social History Social History   Tobacco Use  . Smoking status: Former Research scientist (life sciences)  . Smokeless tobacco: Never Used   Substance Use Topics  . Alcohol use: No  . Drug use: No    Review of Systems  Constitutional: No fever/chills Eyes: No visual changes. ENT: No sore throat. Cardiovascular: Denies chest pain. Respiratory: Denies shortness of breath. Gastrointestinal: No abdominal pain.  No nausea, no vomiting.  No diarrhea.  No constipation. Genitourinary: Negative for dysuria. Musculoskeletal: Negative for back pain.  Positive for leg swelling and pain. Skin: Negative for rash. Neurological: Negative for headaches, focal weakness or numbness.  ____________________________________________   PHYSICAL EXAM:  VITAL SIGNS: ED Triage Vitals  Enc Vitals Group     BP 04/06/19 1619 96/66     Pulse Rate 04/06/19 1617 98     Resp 04/06/19 1617 16     Temp 04/06/19 1619 98.8 F (37.1 C)     Temp Source 04/06/19 1617 Oral     SpO2 04/06/19 1617 92 %     Weight --      Height --      Head Circumference --      Peak Flow --      Pain Score 04/06/19 1618 10     Pain Loc --      Pain Edu? --      Excl. in [place]? --     Constitutional: Alert and oriented. Eyes: Conjunctivae are normal. Head: Atraumatic. Nose: No congestion/rhinnorhea. Mouth/Throat: Mucous membranes are moist. Neck: Normal ROM Cardiovascular: Normal rate, regular rhythm. Grossly normal heart sounds. Respiratory: Normal respiratory effort.  No retractions. Lungs CTAB. Gastrointestinal: Soft and nontender. No distention. Genitourinary: deferred Musculoskeletal: Diffuse tenderness to bilateral lower extremities with 1+ pitting edema to knees bilaterally.  Maximal tenderness associated with erythema and warmth to medial portion of her right lower leg.  2+ DP pulses bilaterally. Neurologic:  Normal speech and language. No gross focal neurologic deficits are appreciated. Skin:  Skin is warm, dry and intact. No rash noted. Psychiatric: Mood and affect are normal. Speech and behavior are normal.   ____________________________________________   LABS (all labs ordered are listed, but only abnormal results are displayed)  Labs Reviewed  BASIC METABOLIC PANEL - Abnormal; Notable for the following components:      Result Value   Chloride 92 (*)    Glucose, Bld 235 (*)    All other components within normal limits  CBC - Abnormal; Notable for the following components:   RBC 5.52 (*)    Hemoglobin 16.0 (*)    HCT 49.2 (*)    All other components within normal limits  BRAIN NATRIURETIC PEPTIDE     PROCEDURES  Procedure(s) performed (including Critical Care):  Procedures   ____________________________________________   INITIAL IMPRESSION / ASSESSMENT AND PLAN / ED COURSE       58 year old female presents to the ED complaining of increased swelling to her bilateral lower extremities with pain maximally to her right lower extremity.  She has edema to  both of her lower extremities with erythema and warmth overlying her right lower extremity.  Ultrasound was obtained and negative for DVT.  Doubt CHF as she has no respiratory symptoms and BNP is within normal limits.  Reviewed patient's echo from recent admission which shows normal EF and does not consistent with diastolic dysfunction.  Suspect the swelling is related to venous insufficiency and counseled patient on use of compression stockings.  Area to the medial portion of her right lower extremity is concerning for superimposed infection, will prescribe course of doxycycline.  Counseled patient to follow-up with her PCP and return to the ED for new or worsening symptoms, patient agrees with plan.      ____________________________________________   FINAL CLINICAL IMPRESSION(S) / ED DIAGNOSES  Final diagnoses:  Venous stasis of both lower extremities  Cellulitis of right lower extremity     ED Discharge Orders         Ordered    doxycycline (VIBRAMYCIN) 100 MG capsule  2 times daily     04/06/19 1847            Note:  This document was prepared using Dragon voice recognition software and may include unintentional dictation errors.   Blake Divine, MD 04/06/19 2132

## 2019-04-06 NOTE — ED Triage Notes (Addendum)
PT c/o bilat leg swelling and redness since last week. Pt was sent here by home health nurse for evaluation. Redness, swelling and warmth noted to touch. PT hx of CHF. Edema noted. PT on 2L chronic of o2 . Denies fever

## 2019-04-06 NOTE — ED Notes (Signed)
spokie with MD Paduchowski, see orders

## 2019-04-19 ENCOUNTER — Telehealth: Payer: Self-pay

## 2019-04-19 NOTE — Telephone Encounter (Signed)
Pt left message in voice mail to call her try to call her back voice mail full

## 2019-04-20 ENCOUNTER — Emergency Department: Payer: Medicare Other

## 2019-04-20 ENCOUNTER — Other Ambulatory Visit: Payer: Self-pay

## 2019-04-20 ENCOUNTER — Emergency Department
Admission: EM | Admit: 2019-04-20 | Discharge: 2019-04-20 | Disposition: A | Discharge status: Home / Self Care | Payer: Medicare Other | Attending: Emergency Medicine | Admitting: Emergency Medicine

## 2019-04-20 ENCOUNTER — Encounter: Payer: Self-pay | Admitting: Emergency Medicine

## 2019-04-20 DIAGNOSIS — R600 Localized edema: Secondary | ICD-10-CM | POA: Diagnosis not present

## 2019-04-20 DIAGNOSIS — R609 Edema, unspecified: Secondary | ICD-10-CM

## 2019-04-20 DIAGNOSIS — E119 Type 2 diabetes mellitus without complications: Secondary | ICD-10-CM | POA: Insufficient documentation

## 2019-04-20 DIAGNOSIS — Z79899 Other long term (current) drug therapy: Secondary | ICD-10-CM | POA: Insufficient documentation

## 2019-04-20 DIAGNOSIS — I509 Heart failure, unspecified: Secondary | ICD-10-CM | POA: Diagnosis not present

## 2019-04-20 DIAGNOSIS — J449 Chronic obstructive pulmonary disease, unspecified: Secondary | ICD-10-CM | POA: Diagnosis not present

## 2019-04-20 DIAGNOSIS — I110 Hypertensive heart disease with heart failure: Secondary | ICD-10-CM | POA: Insufficient documentation

## 2019-04-20 DIAGNOSIS — M7989 Other specified soft tissue disorders: Secondary | ICD-10-CM | POA: Diagnosis present

## 2019-04-20 DIAGNOSIS — Z87891 Personal history of nicotine dependence: Secondary | ICD-10-CM | POA: Diagnosis not present

## 2019-04-20 LAB — COMPREHENSIVE METABOLIC PANEL
ALT: 16 U/L (ref 0–44)
AST: 16 U/L (ref 15–41)
Albumin: 3.7 g/dL (ref 3.5–5.0)
Alkaline Phosphatase: 57 U/L (ref 38–126)
Anion gap: 10 (ref 5–15)
BUN: 13 mg/dL (ref 6–20)
CO2: 33 mmol/L — ABNORMAL HIGH (ref 22–32)
Calcium: 9.8 mg/dL (ref 8.9–10.3)
Chloride: 99 mmol/L (ref 98–111)
Creatinine, Ser: 0.8 mg/dL (ref 0.44–1.00)
GFR calc Af Amer: >60 mL/min (ref >60)
GFR calc non Af Amer: >60 mL/min (ref >60)
Glucose, Bld: 94 mg/dL (ref 70–99)
Potassium: 4.6 mmol/L (ref 3.5–5.1)
Sodium: 142 mmol/L (ref 135–145)
Total Bilirubin: 0.5 mg/dL (ref 0.3–1.2)
Total Protein: 6.7 g/dL (ref 6.5–8.1)

## 2019-04-20 LAB — CBC WITH DIFFERENTIAL/PLATELET
Abs Immature Granulocytes: 0.02 10*3/uL (ref 0.00–0.07)
Basophils Absolute: 0.1 10*3/uL (ref 0.0–0.1)
Basophils Relative: 1 %
Eosinophils Absolute: 0.5 10*3/uL (ref 0.0–0.5)
Eosinophils Relative: 5 %
HCT: 49.2 % — ABNORMAL HIGH (ref 36.0–46.0)
Hemoglobin: 15.4 g/dL — ABNORMAL HIGH (ref 12.0–15.0)
Immature Granulocytes: 0 %
Lymphocytes Relative: 30 %
Lymphs Abs: 2.8 10*3/uL (ref 0.7–4.0)
MCH: 28.6 pg (ref 26.0–34.0)
MCHC: 31.3 g/dL (ref 30.0–36.0)
MCV: 91.3 fL (ref 80.0–100.0)
Monocytes Absolute: 0.8 10*3/uL (ref 0.1–1.0)
Monocytes Relative: 8 %
Neutro Abs: 5.5 10*3/uL (ref 1.7–7.7)
Neutrophils Relative %: 56 %
Platelets: 211 10*3/uL (ref 150–400)
RBC: 5.39 MIL/uL — ABNORMAL HIGH (ref 3.87–5.11)
RDW: 14.1 % (ref 11.5–15.5)
WBC: 9.6 10*3/uL (ref 4.0–10.5)
nRBC: 0 % (ref 0.0–0.2)

## 2019-04-20 LAB — TROPONIN I (HIGH SENSITIVITY): Troponin I (High Sensitivity): 5 ng/L (ref <18)

## 2019-04-20 LAB — BRAIN NATRIURETIC PEPTIDE: B Natriuretic Peptide: 222 pg/mL — ABNORMAL HIGH (ref 0.0–100.0)

## 2019-04-20 MED ORDER — AMOXICILLIN-POT CLAVULANATE 875-125 MG PO TABS: 1.0000 | ORAL_TABLET | Freq: Two times a day (BID) | ORAL | 0 refills | Status: DC

## 2019-04-20 MED ORDER — ONDANSETRON HCL 4 MG/2ML IJ SOLN
4.0000 mg | Freq: Once | INTRAMUSCULAR | Status: AC
  Administered 2019-04-20: 4 mg via INTRAVENOUS
  Filled 2019-04-20: qty 2

## 2019-04-20 MED ORDER — IOHEXOL 350 MG/ML SOLN
75.0000 mL | Freq: Once | INTRAVENOUS | Status: AC | PRN
  Administered 2019-04-20: 20:00:00 75 mL via INTRAVENOUS
  Filled 2019-04-20: qty 75

## 2019-04-20 MED ORDER — FUROSEMIDE 10 MG/ML IJ SOLN
40.0000 mg | Freq: Once | INTRAMUSCULAR | Status: AC
  Administered 2019-04-20: 40 mg via INTRAVENOUS
  Filled 2019-04-20: qty 4

## 2019-04-20 MED ORDER — IPRATROPIUM-ALBUTEROL 0.5-2.5 (3) MG/3ML IN SOLN
3.0000 mL | Freq: Once | RESPIRATORY_TRACT | Status: AC
  Administered 2019-04-20: 3 mL via RESPIRATORY_TRACT
  Filled 2019-04-20: qty 3

## 2019-04-20 MED ORDER — AMOXICILLIN-POT CLAVULANATE 875-125 MG PO TABS
1.0000 | ORAL_TABLET | Freq: Once | ORAL | Status: AC
  Administered 2019-04-20: 1 via ORAL
  Filled 2019-04-20: qty 1

## 2019-04-20 MED ORDER — MORPHINE SULFATE (PF) 4 MG/ML IV SOLN
4.0000 mg | Freq: Once | INTRAVENOUS | Status: AC
  Administered 2019-04-20: 4 mg via INTRAVENOUS
  Filled 2019-04-20: qty 1

## 2019-04-20 NOTE — Discharge Instructions (Signed)
I think the Lasix should help some with your edema.  I am going to give you some Augmentin 1 pill twice a day to see if it would help with possible cellulitis.  Again I am not sure it cellulitis but the right leg does look a little worrisome.  Please return for increasing pain redness or swelling or if you get any fever.  Please remember to keep your legs up as much as possible.

## 2019-04-20 NOTE — ED Notes (Signed)
US in room 

## 2019-04-20 NOTE — ED Notes (Signed)
Patient transported to CT 

## 2019-04-20 NOTE — ED Notes (Signed)
See triage note  Presents with swelling and redness to both legs  States hx of cellulitis

## 2019-04-20 NOTE — ED Provider Notes (Signed)
[facility] Emergency Department Provider Note   ____________________________________________   First MD Initiated Contact with Patient 04/20/19 1723     (approximate)  I have reviewed the triage vital signs and the nursing notes.   HISTORY  Chief Complaint Leg Swelling    HPI Shelly [name] is a 58 y.o. female patient with a recent visit for cellulitis comes back because her legs are swelling and painful and slightly red again.  She is saying she is taking her Lasix and keeping her legs up is not seeming to help.         Past Medical History:  Diagnosis Date  . Asthma   . CHF (congestive heart failure) ([place])   . COPD (chronic obstructive pulmonary disease) ([place])   . Diabetes mellitus without complication ([place])   . Hypertension   . Sleep apnea     Patient Active Problem List   Diagnosis Date Noted  . Generalized anxiety disorder 03/02/2019  . Acute on chronic respiratory failure with hypoxia and hypercapnia ([place]) 02/27/2019  . DM (diabetes mellitus), type 2 ([place]) 09/08/2016  . COPD exacerbation ([place]) 09/07/2016  . Neck pain 04/02/2016  . Abdominal pain 01/04/2014    Past Surgical History:  Procedure Laterality Date  . ABDOMINAL HYSTERECTOMY    . COLON SURGERY      Prior to Admission medications   Medication Sig Start Date End Date Taking? Authorizing Provider  ipratropium-albuterol (DUONEB) 0.5-2.5 (3) MG/3ML SOLN Inhale 3 mLs into the lungs 4 (four) times daily. 03/20/19 03/14/20 Yes [provider]  albuterol (VENTOLIN HFA) 108 (90 Base) MCG/ACT inhaler Inhale 2 puffs into the lungs every 6 (six) hours as needed for wheezing or shortness of breath. 03/06/19   Fritzi Mandes, MD  amoxicillin-clavulanate (AUGMENTIN) 875-125 MG tablet Take 1 tablet by mouth 2 (two) times daily for 10 days. 04/20/19 04/30/19  Nena Polio, MD  baclofen (LIORESAL) 10 MG tablet Take 10 mg by mouth 3 (three) times daily. 01/13/19   [provider]  busPIRone (BUSPAR) 10 MG tablet Take 10 mg by mouth 2 (two) times daily. 04/14/19   [provider]  clonazePAM (KLONOPIN) 0.5 MG tablet Take 1 tablet (0.5 mg total) by mouth 3 (three) times daily. 03/03/19   Max Sane, MD  DULoxetine (CYMBALTA) 30 MG capsule Take 1 capsule (30 mg total) by mouth 2 (two) times daily. 03/03/19   Max Sane, MD  escitalopram (LEXAPRO) 10 MG tablet Take 10 mg by mouth daily. 04/14/19   [provider]  fluticasone furoate-vilanterol (BREO ELLIPTA) 200-25 MCG/INH AEPB Inhale 1 puff into the lungs daily. 09/09/16   Fritzi Mandes, MD  Fluticasone-Salmeterol (ADVAIR DISKUS) 250-50 MCG/DOSE AEPB Inhale 1 puff into the lungs 2 (two) times daily. 03/06/19 03/05/20  Fritzi Mandes, MD  furosemide (LASIX) 40 MG tablet Take 40 mg by mouth 2 (two) times daily. 04/05/19   [provider]  lisinopril (ZESTRIL) 20 MG tablet Take 20 mg by mouth daily. 01/04/19   [provider]  naproxen (NAPROSYN) 500 MG tablet Take 500 mg by mouth 2 (two) times daily. 03/21/19   [provider]    Allergies Patient has no known allergies.  Family History  Adopted: Yes  Family history unknown: Yes    Social History Social History   Tobacco Use  . Smoking status: Former Research scientist (life sciences)  . Smokeless tobacco: Never Used  Substance Use Topics  . Alcohol use: No  . Drug use: No  Review of Systems  Constitutional: No fever/chills Eyes: No visual changes. ENT: No sore throat. Cardiovascular: Denies chest pain. Respiratory: Denies shortness of breath. Gastrointestinal: No abdominal pain.  No nausea, no vomiting.  No diarrhea.  No constipation. Genitourinary: Negative for dysuria. Musculoskeletal: Negative for back pain. Skin: Negative for rash. Neurological: Negative for headaches, focal weakness   ____________________________________________   PHYSICAL EXAM:  VITAL SIGNS: ED Triage Vitals  Enc Vitals Group     BP 04/20/19 1523  114/82     Pulse Rate 04/20/19 1523 82     Resp 04/20/19 1523 16     Temp 04/20/19 1523 97.8 F (36.6 C)     Temp Source 04/20/19 1523 Oral     SpO2 04/20/19 1523 92 %     Weight 04/20/19 1524 185 lb (83.9 kg)     Height 04/20/19 1524 5\' 5"  (1.651 m)     Head Circumference --      Peak Flow --      Pain Score 04/20/19 1524 8     Pain Loc --      Pain Edu? --      Excl. in [place]? --     Constitutional: Alert and oriented.  Chronically ill appearing and in no acute distress. Eyes: Conjunctivae are normal. I. Head: Atraumatic. Nose: No congestion/rhinnorhea. Mouth/Throat: Mucous membranes are moist.  Oropharynx non-erythematous. Neck: No stridor.   Cardiovascular: Normal rate, regular rhythm. Grossly normal heart sounds.  Good peripheral circulation. Respiratory: Normal respiratory effort.  No retractions. Lungs CTAB. Gastrointestinal: Soft and nontender. No distention. No abdominal bruits. No CVA tenderness. Musculoskeletal: Legs are very faintly red.  They are not warm they are painful they are edematous.  Really does not look like cellulitis to me.  It looks more like fluid overload.  We will get some ultrasound to make sure there is no DVTs especially because the right leg is larger than the left. Neurologic:  Normal speech and language. No gross focal neurologic deficits are appreciated.  Skin:  Skin is warm, dry and intact. No rash noted. Psychiatric: Mood and affect are normal. Speech and behavior are normal.  ____________________________________________   LABS (all labs ordered are listed, but only abnormal results are displayed)  Labs Reviewed  COMPREHENSIVE METABOLIC PANEL - Abnormal; Notable for the following components:      Result Value   CO2 33 (*)    All other components within normal limits  CBC WITH DIFFERENTIAL/PLATELET - Abnormal; Notable for the following components:   RBC 5.39 (*)    Hemoglobin 15.4 (*)    HCT 49.2 (*)    All other components within normal  limits  BRAIN NATRIURETIC PEPTIDE - Abnormal; Notable for the following components:   B Natriuretic Peptide 222.0 (*)    All other components within normal limits  TROPONIN I (HIGH SENSITIVITY)   ____________________________________________  EKG   ____________________________________________  RADIOLOGY  ED MD interpretation: Chest x-ray shows atelectasis versus scarring at the right base.  Ultrasounds of the legs do not show any DVTs  Official radiology report(s): Dg Chest 2 View  Result Date: 04/20/2019 CLINICAL DATA:  Cough, history CHF, COPD, hypertension, diabetes mellitus EXAM: CHEST - 2 VIEW COMPARISON:  02/27/2019 FINDINGS: Normal heart size, mediastinal contours, and pulmonary vascularity. Atherosclerotic calcification aorta. Emphysematous changes with atelectasis or scarring at minor fissure and RIGHT base. No acute infiltrate, pleural effusion or pneumothorax. Bones demineralized. IMPRESSION: Atelectasis versus scarring at minor fissure and RIGHT base. Emphysematous changes without acute infiltrate.  Aortic atherosclerosis. Electronically Signed   By: Lavonia Dana M.D.   On: 04/20/2019 15:59   Ct Angio Chest Pe W And/or Wo Contrast  Result Date: 04/20/2019 CLINICAL DATA:  Chronic dyspnea, recently treated for cellulitis EXAM: CT ANGIOGRAPHY CHEST WITH CONTRAST TECHNIQUE: Multidetector CT imaging of the chest was performed using the standard protocol during bolus administration of intravenous contrast. Multiplanar CT image reconstructions and MIPs were obtained to evaluate the vascular anatomy. CONTRAST:  67mL OMNIPAQUE IOHEXOL 350 MG/ML SOLN COMPARISON:  None. FINDINGS: Cardiovascular: There is a optimal opacification of the pulmonary arteries. There is no central,segmental, or subsegmental filling defects within the pulmonary arteries. The heart is normal in size. Coronary artery calcifications. No pericardial effusion or thickening. No evidence right heart strain. There is normal  three-vessel brachiocephalic anatomy without proximal stenosis. Scattered aortic atherosclerosis is noted. Mediastinum/Nodes: No hilar, mediastinal, or axillary adenopathy. Thyroid gland, trachea, and esophagus demonstrate no significant findings. Lungs/Pleura: Minimal bibasilar streaky atelectasis seen. No large airspace consolidation or effusion. Upper Abdomen: Again noted are bilateral adrenal adenomas. The upper abdomen is otherwise unremarkable. Musculoskeletal: No chest wall abnormality. No acute or significant osseous findings. Degenerative changes in the thoracolumbar spine. Review of the MIP images confirms the above findings. IMPRESSION: No central, segmental, or subsegmental pulmonary embolism. Minimal amount of streaky bibasilar atelectasis. No acute intrathoracic pathology to explain the patient's symptoms. Aortic Atherosclerosis (ICD10-I70.0). Electronically Signed   By: Prudencio Pair M.D.   On: 04/20/2019 20:48   US Venous Img Lower Bilateral (dvt)  Result Date: 04/20/2019 CLINICAL DATA:  Swollen tender legs for 2 days EXAM: BILATERAL LOWER EXTREMITY VENOUS DOPPLER ULTRASOUND TECHNIQUE: Gray-scale sonography with graded compression, as well as color Doppler and duplex ultrasound were performed to evaluate the lower extremity deep venous systems from the level of the common femoral vein and including the common femoral, femoral, profunda femoral, popliteal and calf veins including the posterior tibial, peroneal and gastrocnemius veins when visible. The superficial great saphenous vein was also interrogated. Spectral Doppler was utilized to evaluate flow at rest and with distal augmentation maneuvers in the common femoral, femoral and popliteal veins. COMPARISON:  None. FINDINGS: RIGHT LOWER EXTREMITY Common Femoral Vein: No evidence of thrombus. Normal compressibility, respiratory phasicity and response to augmentation. Saphenofemoral Junction: No evidence of thrombus. Normal compressibility and  flow on color Doppler imaging. Profunda Femoral Vein: No evidence of thrombus. Normal compressibility and flow on color Doppler imaging. Femoral Vein: No evidence of thrombus. Normal compressibility, respiratory phasicity and response to augmentation. Popliteal Vein: No evidence of thrombus. Normal compressibility, respiratory phasicity and response to augmentation. Calf Veins: There is limited visualization of the peroneal vein. However within the posterior tibialis vein no evidence of thrombus. Normal compressibility and flow on color Doppler imaging. Superficial Great Saphenous Vein: No evidence of thrombus. Normal compressibility. Venous Reflux:  None. Other Findings:  None. LEFT LOWER EXTREMITY Common Femoral Vein: No evidence of thrombus. Normal compressibility, respiratory phasicity and response to augmentation. Saphenofemoral Junction: No evidence of thrombus. Normal compressibility and flow on color Doppler imaging. Profunda Femoral Vein: No evidence of thrombus. Normal compressibility and flow on color Doppler imaging. Femoral Vein: No evidence of thrombus. Normal compressibility, respiratory phasicity and response to augmentation. Popliteal Vein: No evidence of thrombus. Normal compressibility, respiratory phasicity and response to augmentation. Calf Veins: Limited visualization of the peroneal veins. However within the posterior tibialis vein no evidence of thrombus. Normal compressibility and flow on color Doppler imaging. Superficial Great Saphenous Vein: No evidence of thrombus.  Normal compressibility. Venous Reflux:  None. Other Findings:  None. IMPRESSION: No evidence of deep venous thrombosis in either lower extremity. Limited visualization the bilateral peroneal veins due to overlying subcutaneous edema. Electronically Signed   By: Prudencio Pair M.D.   On: 04/20/2019 19:01    ____________________________________________   PROCEDURES  Procedure(s) performed (including Critical Care):   Procedures   ____________________________________________   INITIAL IMPRESSION / ASSESSMENT AND PLAN / ED COURSE  Patient's 1 leg looks quite well now the other leg is still edematous and now looks more red.  This may represent cellulitis she wants to go home although I did offer her admission I will let her go we will try some Augmentin this time 1 twice a day she will return if she is worse and keep her legs up as much as possible.              ____________________________________________   FINAL CLINICAL IMPRESSION(S) / ED DIAGNOSES  Final diagnoses:  Leg edema     ED Discharge Orders         Ordered    amoxicillin-clavulanate (AUGMENTIN) 875-125 MG tablet  2 times daily     04/20/19 2107           Note:  This document was prepared using Dragon voice recognition software and may include unintentional dictation errors.    Nena Polio, MD 04/20/19 2109

## 2019-04-20 NOTE — ED Notes (Addendum)
Pt states she wants to go home. Pt wheeled to lobby to meet daughter for ride home after discharge.

## 2019-04-20 NOTE — ED Triage Notes (Signed)
Pt in via [facility] from home, reports swelling, redness to bilateral lower legs.  Reports recently treated for cellulitis but symptoms have returned.  Vitals WDL, NAD noted at this time.

## 2019-04-26 ENCOUNTER — Other Ambulatory Visit: Payer: Self-pay

## 2019-04-26 ENCOUNTER — Encounter: Payer: Self-pay | Admitting: Internal Medicine

## 2019-04-26 ENCOUNTER — Ambulatory Visit: Payer: Medicare Other | Admitting: Internal Medicine

## 2019-04-26 VITALS — BP 115/76 | HR 90 | Temp 97.2°F | Resp 16 | Ht 65.0 in | Wt 201.6 lb

## 2019-04-26 DIAGNOSIS — F419 Anxiety disorder, unspecified: Secondary | ICD-10-CM

## 2019-04-26 DIAGNOSIS — J441 Chronic obstructive pulmonary disease with (acute) exacerbation: Secondary | ICD-10-CM

## 2019-04-26 MED ORDER — CLONAZEPAM 0.5 MG PO TABS: 0.5000 mg | ORAL_TABLET | Freq: Two times a day (BID) | ORAL | 0 refills | Status: DC

## 2019-04-26 MED ORDER — AZITHROMYCIN 250 MG PO TABS: ORAL_TABLET | ORAL | 0 refills | Status: DC

## 2019-04-26 NOTE — Progress Notes (Signed)
[facility] [place], [place] [address]  Internal MEDICINE  Office Visit Note  Patient Name: Shelly [name]  [id number]  [id number]  Date of Service: 05/14/2019  Chief Complaint  Patient presents with  . Shortness of Breath    w/ oxygen and coughing up mucous, going on since last week, mucous was yellow last week   . Medication Management    pt states that DFK said we could control her clonazepam, her PCP has decreased it to 12 tabs a month as needed and pt states she was taking is once 3 times daily. pt has to take care of her parents as well.     HPI Pt is here for a sick visit. R[place]a has had a complicated last month with bilateral leg cellulitis with multiple visits to ED, urgent care and [facility]. Has completed a course of Doxycycline for cellulitis which did not improve symptoms, was last prescribed Augmentin but was only able to take 2 doses before she became very nauseous and was vomiting for 2 days. Over the last two weeks has felt as though she is developing a lung infection. She complains of increased shortness of breath, lung tightness and productive cough. Recent chest x-ray revealed atelectasis vs. scarring. CT of chest to rule out PE (-), venous US for DVT (-) on 11/12. Patient is tearful as she is tired of not feeling well and is very anxious. Has had a rough last few months of mother passing away recently as well as her father-in-law. Denies fever or chest pain.     Current Medication:  Outpatient Encounter Medications as of 04/26/2019  Medication Sig  . albuterol (VENTOLIN HFA) 108 (90 Base) MCG/ACT inhaler Inhale 2 puffs into the lungs every 6 (six) hours as needed for wheezing or shortness of breath.  . baclofen (LIORESAL) 10 MG tablet Take 10 mg by mouth 3 (three) times daily.  . busPIRone (BUSPAR) 10 MG tablet Take 10 mg by mouth 2 (two) times daily.  . clonazePAM (KLONOPIN) 0.5 MG tablet Take 1 tablet (0.5 mg total) by mouth  2 (two) times daily.  . DULoxetine (CYMBALTA) 30 MG capsule Take 1 capsule (30 mg total) by mouth 2 (two) times daily.  Marland Kitchen escitalopram (LEXAPRO) 10 MG tablet Take 10 mg by mouth daily.  . fluticasone furoate-vilanterol (BREO ELLIPTA) 200-25 MCG/INH AEPB Inhale 1 puff into the lungs daily.  . Fluticasone-Salmeterol (ADVAIR DISKUS) 250-50 MCG/DOSE AEPB Inhale 1 puff into the lungs 2 (two) times daily.  . furosemide (LASIX) 40 MG tablet Take 40 mg by mouth 2 (two) times daily.  Marland Kitchen ipratropium-albuterol (DUONEB) 0.5-2.5 (3) MG/3ML SOLN Inhale 3 mLs into the lungs 4 (four) times daily.  Mar[place] Kitchen lisinopril (ZESTRIL) 20 MG tablet Take 20 mg by mouth daily.  . naproxen (NAPROSYN) 500 MG tablet Take 500 mg by mouth 2 (two) times daily.  . [DISCONTINUED] clonazePAM (KLONOPIN) 0.5 MG tablet Take 1 tablet (0.5 mg total) by mouth 3 (three) times daily.  Marland Kitchen azithromycin (ZITHROMAX) 250 MG tablet Use as directed  . [DISCONTINUED] amoxicillin-clavulanate (AUGMENTIN) 875-125 MG tablet Take 1 tablet by mouth 2 (two) times daily for 10 days. (Patient not taking: Reported on 04/26/2019)   No facility-administered encounter medications on file as of 04/26/2019.       Medical History: Past Medical History:  Diagnosis Date  . Asthma   . CHF (congestive heart failure) ([place])   . COPD (chronic obstructive pulmonary disease) ([place])   . Diabetes  mellitus without complication ([place])   . Hypertension   . Sleep apnea      Vital Signs: BP 115/76   Pulse 90   Temp (!) 97.2 F (36.2 C)   Resp 16   Ht 5\' 5"  (1.651 m)   Wt 201 lb 9.6 oz (91.4 kg)   SpO2 91% Comment: with oxgyen  BMI 33.55 kg/m    Review of Systems  Constitutional: Negative for chills, fatigue and unexpected weight change.  HENT: Negative for congestion, rhinorrhea, sneezing and sore throat.   Eyes: Negative for photophobia, pain and redness.  Respiratory: Positive for cough, chest tightness and shortness of breath.   Cardiovascular: Negative for  chest pain and palpitations.  Gastrointestinal: Negative for abdominal pain, constipation, diarrhea, nausea and vomiting.  Endocrine: Negative.   Genitourinary: Negative for dysuria and frequency.  Musculoskeletal: Negative for arthralgias, back pain, joint swelling and neck pain.  Skin: Negative for rash.       Redness and swelling of right lower leg  Allergic/Immunologic: Negative.   Neurological: Negative for tremors and numbness.  Hematological: Negative for adenopathy. Does not bruise/bleed easily.  Psychiatric/Behavioral: Negative for behavioral problems and sleep disturbance. The patient is not nervous/anxious.       Physical Exam Vitals signs and nursing note reviewed.  Constitutional:      General: She is not in acute distress.    Appearance: She is well-developed. She is not diaphoretic.  HENT:     Head: Normocephalic and atraumatic.     Mouth/Throat:     Pharynx: No oropharyngeal exudate.  Eyes:     Pupils: Pupils are equal, round, and reactive to light.  Neck:     Musculoskeletal: Normal range of motion and neck supple.     Thyroid: No thyromegaly.     Vascular: No JVD.     Trachea: No tracheal deviation.  Cardiovascular:     Rate and Rhythm: Normal rate and regular rhythm.     Heart sounds: Normal heart sounds. No murmur. No friction rub. No gallop.   Pulmonary:     Effort: Pulmonary effort is normal. No respiratory distress.     Breath sounds: Normal breath sounds. No wheezing or rales.     Comments: Diminished breath sounds of right lung base Chest:     Chest wall: No tenderness.  Abdominal:     Palpations: Abdomen is soft.     Tenderness: There is no abdominal tenderness. There is no guarding.  Musculoskeletal: Normal range of motion.  Lymphadenopathy:     Cervical: No cervical adenopathy.  Skin:    General: Skin is warm and dry.     Comments: Mild redness and slight swelling to right lower leg  Neurological:     Mental Status: She is alert and  oriented to person, place, and time.     Cranial Nerves: No cranial nerve deficit.  Psychiatric:        Behavior: Behavior normal.        Thought Content: Thought content normal.        Judgment: Judgment normal.     Assessment/Plan: 1. COPD exacerbation ([place]) Advised to monitor symptoms, use inhalers and nebulizer as needed. Discussed if breathing difficulty worsens over the weekend to call EMS.  - azithromycin (ZITHROMAX) 250 MG tablet; Use as directed  Dispense: 6 tablet; Refill: 0  2. Anxiety Stable on current therapy, continue to monitor. Discussed this medication is addicting and to use on an as needed basis. At this visit patient  was very tearful and anxious about her health. Discussed possibility of referring patient to palliative care for symptom management. - clonazePAM (KLONOPIN) 0.5 MG tablet; Take 1 tablet (0.5 mg total) by mouth 2 (two) times daily.  Dispense: 60 tablet; Refill: 0 Reviewed risks and possible side effects associated with taking opiates, benzodiazepines and other CNS depressants. Combination of these could cause dizziness and drowsiness. Advised patient not to drive or operate machinery when taking these medications, as patient's and other's life can be at risk and will have consequences. Patient verbalized understanding in this matter. Dependence and abuse for these drugs will be monitored closely. A Controlled substance policy and procedure is on file which allows [facility] to order a urine drug screen test at any visit. Patient understands and agrees with the plan  General Counseling: lilianna wininger understanding of the findings of todays visit and agrees with plan of treatment. I have discussed any further diagnostic evaluation that may be needed or ordered today. We also reviewed her medications today. she has been encouraged to call the office with any questions or concerns that should arise related to todays visit.   No orders of the defined  types were placed in this encounter.   Meds ordered this encounter  Medications  . azithromycin (ZITHROMAX) 250 MG tablet    Sig: Use as directed    Dispense:  6 tablet    Refill:  0  . clonazePAM (KLONOPIN) 0.5 MG tablet    Sig: Take 1 tablet (0.5 mg total) by mouth 2 (two) times daily.    Dispense:  60 tablet    Refill:  0    Time spent: 20 Minutes  This patient was seen by Orson Gear AGNP-C in Collaboration with Dr Lavera Guise as a part of collaborative care agreement.  Kendell Bane AGNP-C Internal Medicine

## 2019-05-01 ENCOUNTER — Other Ambulatory Visit: Payer: Self-pay | Admitting: Adult Health

## 2019-05-01 ENCOUNTER — Telehealth: Payer: Self-pay

## 2019-05-01 DIAGNOSIS — R059 Cough, unspecified: Secondary | ICD-10-CM

## 2019-05-01 DIAGNOSIS — R05 Cough: Secondary | ICD-10-CM

## 2019-05-01 MED ORDER — PREDNISONE 10 MG PO TABS: ORAL_TABLET | ORAL | 0 refills | Status: DC

## 2019-05-01 MED ORDER — HYDROCOD POLST-CPM POLST ER 10-8 MG/5ML PO SUER: 5.0000 mL | Freq: Every evening | ORAL | 0 refills | Status: DC | PRN

## 2019-05-01 NOTE — Telephone Encounter (Signed)
Pt advised we send  5 days of prednisone 10mg  daily, and a small amount of codeine cough syrup.  If symptoms fail to improve, or are persistent she will follow up in clinic

## 2019-05-01 NOTE — Progress Notes (Signed)
Pt continues to cough and have wheezing.  Will provide 5 days of prednisone 10mg  daily, and a small amount of codeine cough syrup.  If symptoms fail to improve, or are persistent she will follow up in clinic.

## 2019-05-11 ENCOUNTER — Telehealth: Payer: Self-pay

## 2019-05-11 NOTE — Telephone Encounter (Signed)
Confirmed appointment with patient. klh °

## 2019-05-15 ENCOUNTER — Ambulatory Visit: Payer: Medicare Other | Admitting: Adult Health

## 2019-05-16 ENCOUNTER — Other Ambulatory Visit: Payer: Self-pay

## 2019-05-16 ENCOUNTER — Ambulatory Visit (INDEPENDENT_AMBULATORY_CARE_PROVIDER_SITE_OTHER): Payer: Medicare Other | Admitting: Vascular Surgery

## 2019-05-16 ENCOUNTER — Encounter (INDEPENDENT_AMBULATORY_CARE_PROVIDER_SITE_OTHER): Payer: Self-pay | Admitting: Vascular Surgery

## 2019-05-16 VITALS — BP 125/86 | HR 85 | Resp 17 | Wt 205.8 lb

## 2019-05-16 DIAGNOSIS — J441 Chronic obstructive pulmonary disease with (acute) exacerbation: Secondary | ICD-10-CM | POA: Diagnosis not present

## 2019-05-16 DIAGNOSIS — I5032 Chronic diastolic (congestive) heart failure: Secondary | ICD-10-CM | POA: Diagnosis not present

## 2019-05-16 DIAGNOSIS — E119 Type 2 diabetes mellitus without complications: Secondary | ICD-10-CM | POA: Diagnosis not present

## 2019-05-16 DIAGNOSIS — M7989 Other specified soft tissue disorders: Secondary | ICD-10-CM | POA: Diagnosis not present

## 2019-05-16 NOTE — Progress Notes (Signed)
Patient ID: Shelly [name], female   DOB: 1960/07/24, 58 y.o.   MRN: [id number]  Chief Complaint  Patient presents with  . New Patient (Initial Visit)    ref Elsworth Soho for venous insufficiency    HPI Shelly [name] is a 58 y.o. female.  I am asked to see the patient by [place] for evaluation of pain and swelling in her legs.  She has noticed this since a hospitalization where it sounds like she had a code event a couple of months ago.  Both legs have had persistent pain and swelling since that time.  The right leg is a little worse than the left leg.  The patient reports nothing has really made it better.  She has been wearing some sort of compression stockings but these have not helped and she says that her legs actually hurt worse wearing the stockings.  Her activity level is down.  She is up about 20 pounds after being started on prednisone for pulmonary disease.  Her swelling is gotten markedly worse after the prednisone.  She is now off the prednisone but the swelling has not gotten better.  She is on a diuretic but has taken this chronically.  She has multiple medical issues as listed below.  No antecedent history of DVT or superficial thrombophlebitis to her knowledge.  She has had a negative DVT study but no functional assessment of her veins   Past Medical History:  Diagnosis Date  . Asthma   . CHF (congestive heart failure) ([place])   . COPD (chronic obstructive pulmonary disease) ([place])   . Diabetes mellitus without complication ([place])   . Hypertension   . Sleep apnea     Past Surgical History:  Procedure Laterality Date  . ABDOMINAL HYSTERECTOMY    . COLON SURGERY       Family History  Adopted: Yes  Family history unknown: Yes      Social History   Tobacco Use  . Smoking status: Former Research scientist (life sciences)  . Smokeless tobacco: Never Used  Substance Use Topics  . Alcohol use: No  . Drug use: No     No Known Allergies  Current Outpatient Medications  Medication Sig Dispense  Refill  . albuterol (VENTOLIN HFA) 108 (90 Base) MCG/ACT inhaler Inhale 2 puffs into the lungs every 6 (six) hours as needed for wheezing or shortness of breath. 1 g 0  . azithromycin (ZITHROMAX) 250 MG tablet Use as directed 6 tablet 0  . baclofen (LIORESAL) 10 MG tablet Take 10 mg by mouth 3 (three) times daily.    . busPIRone (BUSPAR) 10 MG tablet Take 10 mg by mouth 2 (two) times daily.    . chlorpheniramine-HYDROcodone (TUSSIONEX PENNKINETIC ER) 10-8 MG/5ML SUER Take 5 mLs by mouth at bedtime as needed for cough. 70 mL 0  . clonazePAM (KLONOPIN) 0.5 MG tablet Take 1 tablet (0.5 mg total) by mouth 2 (two) times daily. 60 tablet 0  . DULoxetine (CYMBALTA) 30 MG capsule Take 1 capsule (30 mg total) by mouth 2 (two) times daily. 60 capsule 0  . escitalopram (LEXAPRO) 10 MG tablet Take 10 mg by mouth daily.    . fluticasone furoate-vilanterol (BREO ELLIPTA) 200-25 MCG/INH AEPB Inhale 1 puff into the lungs daily. 1 each 0  . Fluticasone-Salmeterol (ADVAIR DISKUS) 250-50 MCG/DOSE AEPB Inhale 1 puff into the lungs 2 (two) times daily. 1 each 0  . furosemide (LASIX) 40 MG tablet Take 40 mg by mouth 2 (two) times daily.    Marland Kitchen  ipratropium-albuterol (DUONEB) 0.5-2.5 (3) MG/3ML SOLN Inhale 3 mLs into the lungs 4 (four) times daily.    Marland Kitchen lisinopril (ZESTRIL) 20 MG tablet Take 20 mg by mouth daily.    . naproxen (NAPROSYN) 500 MG tablet Take 500 mg by mouth 2 (two) times daily.    . predniSONE (DELTASONE) 10 MG tablet Take one tab by mouth daily. 5 tablet 0   No current facility-administered medications for this visit.       REVIEW OF SYSTEMS (Negative unless checked)  Constitutional: [] Weight loss  [] Fever  [] Chills Cardiac: [] Chest pain   [] Chest pressure   [] Palpitations   [x] Shortness of breath when laying flat   [] Shortness of breath at rest   [x] Shortness of breath with exertion. Vascular:  [x] Pain in legs with walking   [x] Pain in legs at rest   [] Pain in legs when laying flat   [] Claudication    [] Pain in feet when walking  [] Pain in feet at rest  [] Pain in feet when laying flat   [] History of DVT   [] Phlebitis   [x] Swelling in legs   [] Varicose veins   [] Non-healing ulcers Pulmonary:   [] Uses home oxygen   [] Productive cough   [] Hemoptysis   [] Wheeze  [x] COPD   [] Asthma Neurologic:  [] Dizziness  [] Blackouts   [] Seizures   [] History of stroke   [] History of TIA  [] Aphasia   [] Temporary blindness   [] Dysphagia   [] Weakness or numbness in arms   [] Weakness or numbness in legs Musculoskeletal:  [x] Arthritis   [] Joint swelling   [x] Joint pain   [] Low back pain Hematologic:  [] Easy bruising  [] Easy bleeding   [] Hypercoagulable state   [] Anemic  [] Hepatitis Gastrointestinal:  [] Blood in stool   [] Vomiting blood  [x] Gastroesophageal reflux/heartburn   [] Abdominal pain Genitourinary:  [] Chronic kidney disease   [] Difficult urination  [] Frequent urination  [] Burning with urination   [] Hematuria Skin:  [] Rashes   [] Ulcers   [] Wounds Psychological:  [] History of anxiety   []  History of major depression.    Physical Exam BP 125/86 (BP Location: Right Arm)   Pulse 85   Resp 17   Wt 205 lb 12.8 oz (93.4 kg)   BMI 34.25 kg/m  Gen:  WD/WN, NAD. Appears older than stated age. Head: [place]/AT, No temporalis wasting. Ear/Nose/Throat: Hearing grossly intact, nares w/o erythema or drainage, oropharynx w/o Erythema/Exudate Eyes: Conjunctiva clear, sclera non-icteric  Neck: trachea midline.  No JVD.  Pulmonary:  Good air movement, respirations not labored, no use of accessory muscles  Cardiac: RRR, no JVD Vascular:  Vessel Right Left  Radial Palpable Palpable                          DP 1+ 1+  PT NP NP   Gastrointestinal:. No masses, surgical incisions, or scars. Musculoskeletal: M/S 5/5 throughout.  Extremities without ischemic changes.  No deformity or atrophy. 2+ RLE edema, 1-2+ LLE edema. Neurologic: Sensation grossly intact in extremities.  Symmetrical.  Speech is fluent. Motor exam as  listed above. Psychiatric: Judgment intact, Mood & affect appropriate for pt's clinical situation. Dermatologic: No rashes or ulcers noted.  No cellulitis or open wounds.    Radiology Dg Chest 2 View  Result Date: 04/20/2019 CLINICAL DATA:  Cough, history CHF, COPD, hypertension, diabetes mellitus EXAM: CHEST - 2 VIEW COMPARISON:  02/27/2019 FINDINGS: Normal heart size, mediastinal contours, and pulmonary vascularity. Atherosclerotic calcification aorta. Emphysematous changes with atelectasis or scarring at minor fissure and RIGHT  base. No acute infiltrate, pleural effusion or pneumothorax. Bones demineralized. IMPRESSION: Atelectasis versus scarring at minor fissure and RIGHT base. Emphysematous changes without acute infiltrate. Aortic atherosclerosis. Electronically Signed   By: Lavonia Dana M.D.   On: 04/20/2019 15:59   Ct Angio Chest Pe W And/or Wo Contrast  Result Date: 04/20/2019 CLINICAL DATA:  Chronic dyspnea, recently treated for cellulitis EXAM: CT ANGIOGRAPHY CHEST WITH CONTRAST TECHNIQUE: Multidetector CT imaging of the chest was performed using the standard protocol during bolus administration of intravenous contrast. Multiplanar CT image reconstructions and MIPs were obtained to evaluate the vascular anatomy. CONTRAST:  57mL OMNIPAQUE IOHEXOL 350 MG/ML SOLN COMPARISON:  None. FINDINGS: Cardiovascular: There is a optimal opacification of the pulmonary arteries. There is no central,segmental, or subsegmental filling defects within the pulmonary arteries. The heart is normal in size. Coronary artery calcifications. No pericardial effusion or thickening. No evidence right heart strain. There is normal three-vessel brachiocephalic anatomy without proximal stenosis. Scattered aortic atherosclerosis is noted. Mediastinum/Nodes: No hilar, mediastinal, or axillary adenopathy. Thyroid gland, trachea, and esophagus demonstrate no significant findings. Lungs/Pleura: Minimal bibasilar streaky  atelectasis seen. No large airspace consolidation or effusion. Upper Abdomen: Again noted are bilateral adrenal adenomas. The upper abdomen is otherwise unremarkable. Musculoskeletal: No chest wall abnormality. No acute or significant osseous findings. Degenerative changes in the thoracolumbar spine. Review of the MIP images confirms the above findings. IMPRESSION: No central, segmental, or subsegmental pulmonary embolism. Minimal amount of streaky bibasilar atelectasis. No acute intrathoracic pathology to explain the patient's symptoms. Aortic Atherosclerosis (ICD10-I70.0). Electronically Signed   By: Prudencio Pair M.D.   On: 04/20/2019 20:48   US Venous Img Lower Bilateral (dvt)  Result Date: 04/20/2019 CLINICAL DATA:  Swollen tender legs for 2 days EXAM: BILATERAL LOWER EXTREMITY VENOUS DOPPLER ULTRASOUND TECHNIQUE: Gray-scale sonography with graded compression, as well as color Doppler and duplex ultrasound were performed to evaluate the lower extremity deep venous systems from the level of the common femoral vein and including the common femoral, femoral, profunda femoral, popliteal and calf veins including the posterior tibial, peroneal and gastrocnemius veins when visible. The superficial great saphenous vein was also interrogated. Spectral Doppler was utilized to evaluate flow at rest and with distal augmentation maneuvers in the common femoral, femoral and popliteal veins. COMPARISON:  None. FINDINGS: RIGHT LOWER EXTREMITY Common Femoral Vein: No evidence of thrombus. Normal compressibility, respiratory phasicity and response to augmentation. Saphenofemoral Junction: No evidence of thrombus. Normal compressibility and flow on color Doppler imaging. Profunda Femoral Vein: No evidence of thrombus. Normal compressibility and flow on color Doppler imaging. Femoral Vein: No evidence of thrombus. Normal compressibility, respiratory phasicity and response to augmentation. Popliteal Vein: No evidence of  thrombus. Normal compressibility, respiratory phasicity and response to augmentation. Calf Veins: There is limited visualization of the peroneal vein. However within the posterior tibialis vein no evidence of thrombus. Normal compressibility and flow on color Doppler imaging. Superficial Great Saphenous Vein: No evidence of thrombus. Normal compressibility. Venous Reflux:  None. Other Findings:  None. LEFT LOWER EXTREMITY Common Femoral Vein: No evidence of thrombus. Normal compressibility, respiratory phasicity and response to augmentation. Saphenofemoral Junction: No evidence of thrombus. Normal compressibility and flow on color Doppler imaging. Profunda Femoral Vein: No evidence of thrombus. Normal compressibility and flow on color Doppler imaging. Femoral Vein: No evidence of thrombus. Normal compressibility, respiratory phasicity and response to augmentation. Popliteal Vein: No evidence of thrombus. Normal compressibility, respiratory phasicity and response to augmentation. Calf Veins: Limited visualization of the peroneal veins. However  within the posterior tibialis vein no evidence of thrombus. Normal compressibility and flow on color Doppler imaging. Superficial Great Saphenous Vein: No evidence of thrombus. Normal compressibility. Venous Reflux:  None. Other Findings:  None. IMPRESSION: No evidence of deep venous thrombosis in either lower extremity. Limited visualization the bilateral peroneal veins due to overlying subcutaneous edema. Electronically Signed   By: Prudencio Pair M.D.   On: 04/20/2019 19:01    Labs Recent Results (from the past 2160 hour(s))  CBC with Differential     Status: Abnormal   Collection Time: 02/27/19  2:04 PM  Result Value Ref Range   WBC 9.9 4.0 - 10.5 K/uL   RBC 5.58 (H) 3.87 - 5.11 MIL/uL   Hemoglobin 16.4 (H) 12.0 - 15.0 g/dL   HCT 53.2 (H) 36.0 - 46.0 %   MCV 95.3 80.0 - 100.0 fL   MCH 29.4 26.0 - 34.0 pg   MCHC 30.8 30.0 - 36.0 g/dL   RDW 15.1 11.5 - 15.5 %    Platelets 216 150 - 400 K/uL   nRBC 0.0 0.0 - 0.2 %   Neutrophils Relative % 75 %   Neutro Abs 7.3 1.7 - 7.7 K/uL   Lymphocytes Relative 18 %   Lymphs Abs 1.8 0.7 - 4.0 K/uL   Monocytes Relative 7 %   Monocytes Absolute 0.7 0.1 - 1.0 K/uL   Eosinophils Relative 0 %   Eosinophils Absolute 0.0 0.0 - 0.5 K/uL   Basophils Relative 0 %   Basophils Absolute 0.0 0.0 - 0.1 K/uL   Immature Granulocytes 0 %   Abs Immature Granulocytes 0.04 0.00 - 0.07 K/uL    Comment: Performed at [facility], [address] [place], [place] [address]  Comprehensive metabolic panel     Status: Abnormal   Collection Time: 02/27/19  2:04 PM  Result Value Ref Range   Sodium 136 135 - 145 mmol/L   Potassium 4.7 3.5 - 5.1 mmol/L   Chloride 89 (L) 98 - 111 mmol/L   CO2 37 (H) 22 - 32 mmol/L   Glucose, Bld 110 (H) 70 - 99 mg/dL   BUN 28 (H) 6 - 20 mg/dL   Creatinine, Ser 0.86 0.44 - 1.00 mg/dL   Calcium 8.9 8.9 - 10.3 mg/dL   Total Protein 6.3 (L) 6.5 - 8.1 g/dL   Albumin 3.6 3.5 - 5.0 g/dL   AST 27 15 - 41 U/L   ALT 48 (H) 0 - 44 U/L   Alkaline Phosphatase 48 38 - 126 U/L   Total Bilirubin 0.8 0.3 - 1.2 mg/dL   GFR calc non Af Amer >60 >60 mL/min   GFR calc Af Amer >60 >60 mL/min   Anion gap 10 5 - 15    Comment: Performed at [facility], [address] [place], [place] [address]  Magnesium     Status: None   Collection Time: 02/27/19  2:04 PM  Result Value Ref Range   Magnesium 2.3 1.7 - 2.4 mg/dL    Comment: Performed at [facility], [address] [place], [place] [address]  Procalcitonin - Baseline     Status: None   Collection Time: 02/27/19  2:04 PM  Result Value Ref Range   Procalcitonin <0.10 ng/mL    Comment:        Interpretation: PCT (Procalcitonin) <= 0.5 ng/mL: Systemic infection (sepsis) is not likely. Local bacterial infection is possible. (NOTE)       Sepsis PCT Algorithm  Lower Respiratory Tract                                       Infection PCT Algorithm    ----------------------------     ----------------------------         PCT < 0.25 ng/mL                PCT < 0.10 ng/mL         Strongly encourage             Strongly discourage   discontinuation of antibiotics    initiation of antibiotics    ----------------------------     -----------------------------       PCT 0.25 - 0.50 ng/mL            PCT 0.10 - 0.25 ng/mL               OR       >80% decrease in PCT            Discourage initiation of                                            antibiotics      Encourage discontinuation           of antibiotics    ----------------------------     -----------------------------         PCT >= 0.50 ng/mL              PCT 0.26 - 0.50 ng/mL               AND        <80% decrease in PCT             Encourage initiation of                                             antibiotics       Encourage continuation           of antibiotics    ----------------------------     -----------------------------        PCT >= 0.50 ng/mL                  PCT > 0.50 ng/mL               AND         increase in PCT                  Strongly encourage                                      initiation of antibiotics    Strongly encourage escalation           of antibiotics                                     -----------------------------  PCT <= 0.25 ng/mL                                                 OR                                        > 80% decrease in PCT                                     Discontinue / Do not initiate                                             antibiotics Performed at [facility], [address] [place], [place] [address]   Brain natriuretic peptide     Status: Abnormal   Collection Time: 02/27/19  2:04 PM  Result Value Ref Range   B Natriuretic Peptide 1,928.0 (H) 0.0 - 100.0 pg/mL    Comment: Performed at [facility], [address]  [place], [place] [address]  SARS Coronavirus 2 [facility] order, Performed in [facility] hospital lab) Nasopharyngeal Nasopharyngeal Swab     Status: None   Collection Time: 02/27/19  2:05 PM   Specimen: Nasopharyngeal Swab  Result Value Ref Range   SARS Coronavirus 2 NEGATIVE NEGATIVE    Comment: (NOTE) If result is NEGATIVE SARS-CoV-2 target nucleic acids are NOT DETECTED. The SARS-CoV-2 RNA is generally detectable in upper and lower  respiratory specimens during the acute phase of infection. The lowest  concentration of SARS-CoV-2 viral copies this assay can detect is 250  copies / mL. A negative result does not preclude SARS-CoV-2 infection  and should not be used as the sole basis for treatment or other  patient management decisions.  A negative result may occur with  improper specimen collection / handling, submission of specimen other  than nasopharyngeal swab, presence of viral mutation(s) within the  areas targeted by this assay, and inadequate number of viral copies  (<250 copies / mL). A negative result must be combined with clinical  observations, patient history, and epidemiological information. If result is POSITIVE SARS-CoV-2 target nucleic acids are DETECTED. The SARS-CoV-2 RNA is generally detectable in upper and lower  respiratory specimens dur ing the acute phase of infection.  Positive  results are indicative of active infection with SARS-CoV-2.  Clinical  correlation with patient history and other diagnostic information is  necessary to determine patient infection status.  Positive results do  not rule out bacterial infection or co-infection with other viruses. If result is PRESUMPTIVE POSTIVE SARS-CoV-2 nucleic acids MAY BE PRESENT.   A presumptive positive result was obtained on the submitted specimen  and confirmed on repeat testing.  While 2019 novel coronavirus  (SARS-CoV-2) nucleic acids may be present in the submitted sample  additional confirmatory testing may  be necessary for epidemiological  and / or clinical management purposes  to differentiate between  SARS-CoV-2 and other Sarbecovirus currently known to infect humans.  If clinically indicated additional testing with an alternate test  methodology [phone]) is advised. The SARS-CoV-2 RNA  is generally  detectable in upper and lower respiratory sp ecimens during the acute  phase of infection. The expected result is Negative. Fact Sheet for Patients:  StrictlyIdeas.no Fact Sheet for Healthcare Providers: BankingDealers.co.za This test is not yet approved or cleared by the [place] FDA and has been authorized for detection and/or diagnosis of SARS-CoV-2 by FDA under an Emergency Use Authorization (EUA).  This EUA will remain in effect (meaning this test can be used) for the duration of the COVID-19 declaration under Section 564(b)(1) of the Act, 21 U.S.C. section 360bbb-3(b)(1), unless the authorization is terminated or revoked sooner. Performed at [facility], [address] [place], [place] [address]   Blood gas, venous     Status: Abnormal   Collection Time: 02/27/19  2:16 PM  Result Value Ref Range   pH, Ven 7.31 7.250 - 7.430   pCO2, Ven 91 (HH) 44.0 - 60.0 mmHg    Comment: CRITICAL RESULT CALLED TO, READ BACK BY AND VERIFIED WITH:  RN LAURA AT 1430, 02/27/19, LS    pO2, Ven 57.0 (H) 32.0 - 45.0 mmHg   Bicarbonate 45.8 (H) 20.0 - 28.0 mmol/L   Acid-Base Excess 15.0 (H) 0.0 - 2.0 mmol/L   O2 Saturation 86.4 %   Patient temperature 37.0    Collection site VENOUS    Sample type VENOUS     Comment: Performed at [facility], [address] [place], [place] [address]  Glucose, capillary     Status: Abnormal   Collection Time: 02/27/19  4:25 PM  Result Value Ref Range   Glucose-Capillary 143 (H) 70 - 99 mg/dL  Urine Drug Screen, Qualitative ([facility] only)     Status: Abnormal   Collection Time: 02/27/19  4:42  PM  Result Value Ref Range   Tricyclic, Ur Screen NONE DETECTED NONE DETECTED   Amphetamines, Ur Screen NONE DETECTED NONE DETECTED   MDMA (Ecstasy)Ur Screen NONE DETECTED NONE DETECTED   Cocaine Metabolite,Ur [place] NONE DETECTED NONE DETECTED   Opiate, Ur Screen NONE DETECTED NONE DETECTED   Phencyclidine (PCP) Ur S NONE DETECTED NONE DETECTED   Cannabinoid 50 Ng, Ur [place] POSITIVE (A) NONE DETECTED   Barbiturates, Ur Screen NONE DETECTED NONE DETECTED   Benzodiazepine, Ur Scrn NONE DETECTED NONE DETECTED   Methadone Scn, Ur NONE DETECTED NONE DETECTED    Comment: (NOTE) Tricyclics + metabolites, urine    Cutoff 1000 ng/mL Amphetamines + metabolites, urine  Cutoff 1000 ng/mL MDMA (Ecstasy), urine              Cutoff 500 ng/mL Cocaine Metabolite, urine          Cutoff 300 ng/mL Opiate + metabolites, urine        Cutoff 300 ng/mL Phencyclidine (PCP), urine         Cutoff 25 ng/mL Cannabinoid, urine                 Cutoff 50 ng/mL Barbiturates + metabolites, urine  Cutoff 200 ng/mL Benzodiazepine, urine              Cutoff 200 ng/mL Methadone, urine                   Cutoff 300 ng/mL The urine drug screen provides only a preliminary, unconfirmed analytical test result and should not be used for non-medical purposes. Clinical consideration and professional judgment should be applied to any positive drug screen result due to possible interfering substances. A more specific alternate chemical method  must be used in order to obtain a confirmed analytical result. Gas chromatography / mass spectrometry (GC/MS) is the preferred confirmat ory method. Performed at [facility], [address] [place], [place] [address]   Blood gas, arterial     Status: Abnormal   Collection Time: 02/27/19  4:58 PM  Result Value Ref Range   FIO2 0.50    Delivery systems VENTILATOR    Mode ASSIST CONTROL    VT 500 mL   LHR 22 resp/min   Peep/cpap 5.0 cm H20   pH, Arterial 7.36 7.350 - 7.450    pCO2 arterial 66 (HH) 32.0 - 48.0 mmHg    Comment: CRITICAL RESULT CALLED TO, READ BACK BY AND VERIFIED WITH:  FUNKE AT 1728, 02/27/2019, LS    pO2, Arterial 68 (L) 83.0 - 108.0 mmHg   Bicarbonate 37.3 (H) 20.0 - 28.0 mmol/L   Acid-Base Excess 9.2 (H) 0.0 - 2.0 mmol/L   O2 Saturation 92.5 %   Patient temperature 37.0    Collection site LEFT BRACHIAL    Sample type ARTERIAL DRAW    Allens test (pass/fail) PASS PASS    Comment: Performed at [facility], [address] [place], [place] [address]  Ethanol     Status: None   Collection Time: 02/27/19  5:00 PM  Result Value Ref Range   Alcohol, Ethyl (B) <10 <10 mg/dL    Comment: (NOTE) Lowest detectable limit for serum alcohol is 10 mg/dL. For medical purposes only. Performed at [facility], [address] [place], [place] [address]   Triglycerides     Status: Abnormal   Collection Time: 02/27/19  5:00 PM  Result Value Ref Range   Triglycerides 196 (H) <150 mg/dL    Comment: Performed at [facility], [address] [place], [place] [address]  Glucose, capillary     Status: Abnormal   Collection Time: 02/27/19  6:44 PM  Result Value Ref Range   Glucose-Capillary 135 (H) 70 - 99 mg/dL  MRSA PCR Screening     Status: None   Collection Time: 02/27/19  6:47 PM   Specimen: Nasal Mucosa; Nasopharyngeal  Result Value Ref Range   MRSA by PCR NEGATIVE NEGATIVE    Comment:        The GeneXpert MRSA Assay (FDA approved for NASAL specimens only), is one component of a comprehensive MRSA colonization surveillance program. It is not intended to diagnose MRSA infection nor to guide or monitor treatment for MRSA infections. Performed at [facility], [address] [place],  [address]   Blood gas, arterial     Status: Abnormal   Collection Time: 02/28/19  4:57 AM  Result Value Ref Range   FIO2 0.40    Delivery systems VENTILATOR    Mode PRESSURE REGULATED VOLUME CONTROL    VT 500  mL   Peep/cpap 5.0 cm H20   pH, Arterial 7.42 7.350 - 7.450   pCO2 arterial 69 (HH) 32.0 - 48.0 mmHg    Comment: CRITICAL RESULT, NOTIFIED PHYSICIAN NP.DANA BLAKENEY AT 0515 ON 02/28/2019 KSL    pO2, Arterial 54 (L) 83.0 - 108.0 mmHg   Bicarbonate 44.8 (H) 20.0 - 28.0 mmol/L   Acid-Base Excess 16.6 (H) 0.0 - 2.0 mmol/L   O2 Saturation 88.2 %   Patient temperature 37.0    Collection site LEFT RADIAL    Sample type ARTERIAL DRAW    Allens test (pass/fail) PASS PASS   Mechanical Rate 15  Comment: Performed at [facility], [address] [place], [place] [id number]  Basic metabolic panel     Status: Abnormal   Collection Time: 02/28/19  5:14 AM  Result Value Ref Range   Sodium 138 135 - 145 mmol/L   Potassium 4.2 3.5 - 5.1 mmol/L   Chloride 92 (L) 98 - 111 mmol/L   CO2 35 (H) 22 - 32 mmol/L   Glucose, Bld 178 (H) 70 - 99 mg/dL   BUN 23 (H) 6 - 20 mg/dL   Creatinine, Ser 0.91 0.44 - 1.00 mg/dL   Calcium 8.9 8.9 - 10.3 mg/dL   GFR calc non Af Amer >60 >60 mL/min   GFR calc Af Amer >60 >60 mL/min   Anion gap 11 5 - 15    Comment: Performed at [facility], [address] [place], [place] [address]  CBC     Status: Abnormal   Collection Time: 02/28/19  5:14 AM  Result Value Ref Range   WBC 6.6 4.0 - 10.5 K/uL   RBC 5.86 (H) 3.87 - 5.11 MIL/uL   Hemoglobin 17.4 (H) 12.0 - 15.0 g/dL    Comment: REPEATED TO VERIFY   HCT 54.9 (H) 36.0 - 46.0 %   MCV 93.7 80.0 - 100.0 fL   MCH 29.7 26.0 - 34.0 pg   MCHC 31.7 30.0 - 36.0 g/dL   RDW 14.8 11.5 - 15.5 %   Platelets 207 150 - 400 K/uL   nRBC 0.0 0.0 - 0.2 %    Comment: Performed at [facility], [address] [place], [place] [address]  HIV Antibody (routine testing w rflx)     Status: None   Collection Time: 02/28/19  5:14 AM  Result Value Ref Range   HIV Screen 4th Generation wRfx Non Reactive Non Reactive    Comment: (NOTE) Performed At: [facility] [place], [place]  [id number] Rush Farmer MD [id number]   ECHOCARDIOGRAM COMPLETE     Status: None   Collection Time: 02/28/19  9:36 AM  Result Value Ref Range   Weight 3,400.38 oz   Height 65 in   BP 90/53 mmHg  Basic metabolic panel     Status: Abnormal   Collection Time: 03/01/19  5:59 AM  Result Value Ref Range   Sodium 138 135 - 145 mmol/L   Potassium 4.8 3.5 - 5.1 mmol/L   Chloride 92 (L) 98 - 111 mmol/L   CO2 37 (H) 22 - 32 mmol/L   Glucose, Bld 154 (H) 70 - 99 mg/dL   BUN 25 (H) 6 - 20 mg/dL   Creatinine, Ser 0.74 0.44 - 1.00 mg/dL   Calcium 8.9 8.9 - 10.3 mg/dL   GFR calc non Af Amer >60 >60 mL/min   GFR calc Af Amer >60 >60 mL/min   Anion gap 9 5 - 15    Comment: Performed at [facility], [address] [place], [place] [address]  CBC     Status: Abnormal   Collection Time: 03/01/19  5:59 AM  Result Value Ref Range   WBC 10.6 (H) 4.0 - 10.5 K/uL   RBC 5.36 (H) 3.87 - 5.11 MIL/uL   Hemoglobin 15.4 (H) 12.0 - 15.0 g/dL   HCT 50.7 (H) 36.0 - 46.0 %   MCV 94.6 80.0 - 100.0 fL   MCH 28.7 26.0 - 34.0 pg   MCHC 30.4 30.0 - 36.0 g/dL   RDW 15.1 11.5 - 15.5 %   Platelets  173 150 - 400 K/uL   nRBC 0.0 0.0 - 0.2 %    Comment: Performed at [facility], [address] [place], [place] [id number]  Basic metabolic panel     Status: Abnormal   Collection Time: 03/02/19  4:35 AM  Result Value Ref Range   Sodium 136 135 - 145 mmol/L   Potassium 4.9 3.5 - 5.1 mmol/L   Chloride 85 (L) 98 - 111 mmol/L   CO2 38 (H) 22 - 32 mmol/L   Glucose, Bld 148 (H) 70 - 99 mg/dL   BUN 22 (H) 6 - 20 mg/dL   Creatinine, Ser 0.72 0.44 - 1.00 mg/dL   Calcium 8.9 8.9 - 10.3 mg/dL   GFR calc non Af Amer >60 >60 mL/min   GFR calc Af Amer >60 >60 mL/min   Anion gap 13 5 - 15    Comment: Performed at [facility], [address] [place], [place] [address]  Magnesium     Status: Abnormal   Collection Time: 03/02/19  4:35 AM  Result Value Ref Range   Magnesium 2.6 (H) 1.7 -  2.4 mg/dL    Comment: Performed at [facility], [address] [place], [place] [address]  CBC with Differential/Platelet     Status: Abnormal   Collection Time: 03/02/19  4:35 AM  Result Value Ref Range   WBC 8.5 4.0 - 10.5 K/uL   RBC 5.46 (H) 3.87 - 5.11 MIL/uL   Hemoglobin 15.8 (H) 12.0 - 15.0 g/dL   HCT 51.0 (H) 36.0 - 46.0 %   MCV 93.4 80.0 - 100.0 fL   MCH 28.9 26.0 - 34.0 pg   MCHC 31.0 30.0 - 36.0 g/dL   RDW 14.8 11.5 - 15.5 %   Platelets 187 150 - 400 K/uL   nRBC 0.0 0.0 - 0.2 %   Neutrophils Relative % 91 %   Neutro Abs 7.7 1.7 - 7.7 K/uL   Lymphocytes Relative 4 %   Lymphs Abs 0.3 (L) 0.7 - 4.0 K/uL   Monocytes Relative 4 %   Monocytes Absolute 0.4 0.1 - 1.0 K/uL   Eosinophils Relative 0 %   Eosinophils Absolute 0.0 0.0 - 0.5 K/uL   Basophils Relative 0 %   Basophils Absolute 0.0 0.0 - 0.1 K/uL   Immature Granulocytes 1 %   Abs Immature Granulocytes 0.04 0.00 - 0.07 K/uL    Comment: Performed at [facility], [address] [place], [place] [address]  CBC     Status: Abnormal   Collection Time: 03/03/19  6:32 AM  Result Value Ref Range   WBC 8.2 4.0 - 10.5 K/uL   RBC 5.54 (H) 3.87 - 5.11 MIL/uL   Hemoglobin 16.1 (H) 12.0 - 15.0 g/dL   HCT 52.4 (H) 36.0 - 46.0 %   MCV 94.6 80.0 - 100.0 fL   MCH 29.1 26.0 - 34.0 pg   MCHC 30.7 30.0 - 36.0 g/dL   RDW 14.6 11.5 - 15.5 %   Platelets 177 150 - 400 K/uL   nRBC 0.0 0.0 - 0.2 %    Comment: Performed at [facility], [address], [place], [place] [id number]  Basic metabolic panel     Status: Abnormal   Collection Time: 03/03/19  6:32 AM  Result Value Ref Range   Sodium 137 135 - 145 mmol/L   Potassium 5.1 3.5 - 5.1 mmol/L   Chloride 86 (L) 98 - 111 mmol/L   CO2 41 (H)  22 - 32 mmol/L   Glucose, Bld 150 (H) 70 - 99 mg/dL   BUN 27 (H) 6 - 20 mg/dL   Creatinine, Ser 0.74 0.44 - 1.00 mg/dL   Calcium 8.8 (L) 8.9 - 10.3 mg/dL   GFR calc non Af Amer >60 >60 mL/min   GFR calc Af Amer  >60 >60 mL/min   Anion gap 10 5 - 15    Comment: Performed at [facility], [place], [place] [address]  SARS CORONAVIRUS 2 (TAT 6-24 HRS) Nasopharyngeal Nasopharyngeal Swab     Status: None   Collection Time: 03/03/19 11:45 AM   Specimen: Nasopharyngeal Swab  Result Value Ref Range   SARS Coronavirus 2 NEGATIVE NEGATIVE    Comment: (NOTE) SARS-CoV-2 target nucleic acids are NOT DETECTED. The SARS-CoV-2 RNA is generally detectable in upper and lower respiratory specimens during the acute phase of infection. Negative results do not preclude SARS-CoV-2 infection, do not rule out co-infections with other pathogens, and should not be used as the sole basis for treatment or other patient management decisions. Negative results must be combined with clinical observations, patient history, and epidemiological information. The expected result is Negative. Fact Sheet for Patients: SugarRoll.be Fact Sheet for Healthcare Providers: https://www.woods-mathews.com/ This test is not yet approved or cleared by the [place] FDA and  has been authorized for detection and/or diagnosis of SARS-CoV-2 by FDA under an Emergency Use Authorization (EUA). This EUA will remain  in effect (meaning this test can be used) for the duration of the COVID-19 declaration under Section 56 4(b)(1) of the Act, 21 U.S.C. section 360bbb-3(b)(1), unless the authorization is terminated or revoked sooner. Performed at [facility], [place] [address] [place], [place] [address]   CBC     Status: Abnormal   Collection Time: 03/04/19  6:02 AM  Result Value Ref Range   WBC 7.2 4.0 - 10.5 K/uL   RBC 5.59 (H) 3.87 - 5.11 MIL/uL   Hemoglobin 16.0 (H) 12.0 - 15.0 g/dL   HCT 53.6 (H) 36.0 - 46.0 %   MCV 95.9 80.0 - 100.0 fL   MCH 28.6 26.0 - 34.0 pg   MCHC 29.9 (L) 30.0 - 36.0 g/dL   RDW 14.5 11.5 - 15.5 %   Platelets 160 150 - 400 K/uL   nRBC 0.0 0.0  - 0.2 %    Comment: Performed at [facility], [address] [place], [place] [id number]  Basic metabolic panel     Status: Abnormal   Collection Time: 03/04/19  6:02 AM  Result Value Ref Range   Sodium 139 135 - 145 mmol/L   Potassium 4.6 3.5 - 5.1 mmol/L   Chloride 86 (L) 98 - 111 mmol/L   CO2 43 (H) 22 - 32 mmol/L   Glucose, Bld 137 (H) 70 - 99 mg/dL   BUN 26 (H) 6 - 20 mg/dL   Creatinine, Ser 0.68 0.44 - 1.00 mg/dL   Calcium 8.9 8.9 - 10.3 mg/dL   GFR calc non Af Amer >60 >60 mL/min   GFR calc Af Amer >60 >60 mL/min   Anion gap 10 5 - 15    Comment: Performed at [facility], [address] [place], [place] [address]  Glucose, capillary     Status: Abnormal   Collection Time: 03/04/19  4:26 PM  Result Value Ref Range   Glucose-Capillary 295 (H) 70 - 99 mg/dL   Comment 1 Notify RN   SARS CORONAVIRUS 2 (  TAT 6-24 HRS) Nasopharyngeal Nasopharyngeal Swab     Status: None   Collection Time: 03/06/19 10:10 AM   Specimen: Nasopharyngeal Swab  Result Value Ref Range   SARS Coronavirus 2 NEGATIVE NEGATIVE    Comment: (NOTE) SARS-CoV-2 target nucleic acids are NOT DETECTED. The SARS-CoV-2 RNA is generally detectable in upper and lower respiratory specimens during the acute phase of infection. Negative results do not preclude SARS-CoV-2 infection, do not rule out co-infections with other pathogens, and should not be used as the sole basis for treatment or other patient management decisions. Negative results must be combined with clinical observations, patient history, and epidemiological information. The expected result is Negative. Fact Sheet for Patients: SugarRoll.be Fact Sheet for Healthcare Providers: https://www.woods-mathews.com/ This test is not yet approved or cleared by the [place] FDA and  has been authorized for detection and/or diagnosis of SARS-CoV-2 by FDA under an Emergency Use Authorization (EUA).  This EUA will remain  in effect (meaning this test can be used) for the duration of the COVID-19 declaration under Section 56 4(b)(1) of the Act, 21 U.S.C. section 360bbb-3(b)(1), unless the authorization is terminated or revoked sooner. Performed at [facility], [place] [address], [place], [place] [id number]   Basic metabolic panel     Status: Abnormal   Collection Time: 04/06/19  4:24 PM  Result Value Ref Range   Sodium 138 135 - 145 mmol/L   Potassium 3.7 3.5 - 5.1 mmol/L   Chloride 92 (L) 98 - 111 mmol/L   CO2 32 22 - 32 mmol/L   Glucose, Bld 235 (H) 70 - 99 mg/dL   BUN 10 6 - 20 mg/dL   Creatinine, Ser 0.89 0.44 - 1.00 mg/dL   Calcium 9.4 8.9 - 10.3 mg/dL   GFR calc non Af Amer >60 >60 mL/min   GFR calc Af Amer >60 >60 mL/min   Anion gap 14 5 - 15    Comment: Performed at [facility], [address] [place], [place] [address]  CBC     Status: Abnormal   Collection Time: 04/06/19  4:24 PM  Result Value Ref Range   WBC 9.2 4.0 - 10.5 K/uL   RBC 5.52 (H) 3.87 - 5.11 MIL/uL   Hemoglobin 16.0 (H) 12.0 - 15.0 g/dL   HCT 49.2 (H) 36.0 - 46.0 %   MCV 89.1 80.0 - 100.0 fL   MCH 29.0 26.0 - 34.0 pg   MCHC 32.5 30.0 - 36.0 g/dL   RDW 13.6 11.5 - 15.5 %   Platelets 196 150 - 400 K/uL   nRBC 0.0 0.0 - 0.2 %    Comment: Performed at [facility], [address] [place], [place] [address]  Brain natriuretic peptide     Status: None   Collection Time: 04/06/19  4:24 PM  Result Value Ref Range   B Natriuretic Peptide 48.0 0.0 - 100.0 pg/mL    Comment: Performed at [facility], [address] [place], [place] [address]  Comprehensive metabolic panel     Status: Abnormal   Collection Time: 04/20/19  3:35 PM  Result Value Ref Range   Sodium 142 135 - 145 mmol/L   Potassium 4.6 3.5 - 5.1 mmol/L   Chloride 99 98 - 111 mmol/L   CO2 33 (H) 22 - 32 mmol/L   Glucose, Bld 94 70 - 99 mg/dL   BUN 13 6 - 20 mg/dL   Creatinine, Ser 0.80 0.44 - 1.00  mg/dL  Calcium 9.8 8.9 - 10.3 mg/dL   Total Protein 6.7 6.5 - 8.1 g/dL   Albumin 3.7 3.5 - 5.0 g/dL   AST 16 15 - 41 U/L   ALT 16 0 - 44 U/L   Alkaline Phosphatase 57 38 - 126 U/L   Total Bilirubin 0.5 0.3 - 1.2 mg/dL   GFR calc non Af Amer >60 >60 mL/min   GFR calc Af Amer >60 >60 mL/min   Anion gap 10 5 - 15    Comment: Performed at [facility], [address] [place], [place] [address]  CBC with Differential     Status: Abnormal   Collection Time: 04/20/19  3:35 PM  Result Value Ref Range   WBC 9.6 4.0 - 10.5 K/uL   RBC 5.39 (H) 3.87 - 5.11 MIL/uL   Hemoglobin 15.4 (H) 12.0 - 15.0 g/dL   HCT 49.2 (H) 36.0 - 46.0 %   MCV 91.3 80.0 - 100.0 fL   MCH 28.6 26.0 - 34.0 pg   MCHC 31.3 30.0 - 36.0 g/dL   RDW 14.1 11.5 - 15.5 %   Platelets 211 150 - 400 K/uL   nRBC 0.0 0.0 - 0.2 %   Neutrophils Relative % 56 %   Neutro Abs 5.5 1.7 - 7.7 K/uL   Lymphocytes Relative 30 %   Lymphs Abs 2.8 0.7 - 4.0 K/uL   Monocytes Relative 8 %   Monocytes Absolute 0.8 0.1 - 1.0 K/uL   Eosinophils Relative 5 %   Eosinophils Absolute 0.5 0.0 - 0.5 K/uL   Basophils Relative 1 %   Basophils Absolute 0.1 0.0 - 0.1 K/uL   Immature Granulocytes 0 %   Abs Immature Granulocytes 0.02 0.00 - 0.07 K/uL    Comment: Performed at [facility], [address] [place], [place] [address]  Brain natriuretic peptide     Status: Abnormal   Collection Time: 04/20/19  3:35 PM  Result Value Ref Range   B Natriuretic Peptide 222.0 (H) 0.0 - 100.0 pg/mL    Comment: Performed at [facility], [address] [place], [place] [address]  Troponin I (High Sensitivity)     Status: None   Collection Time: 04/20/19  3:35 PM  Result Value Ref Range   Troponin I (High Sensitivity) 5 <18 ng/L    Comment: (NOTE) Elevated high sensitivity troponin I (hsTnI) values and significant  changes across serial measurements may suggest ACS but many other  chronic and acute conditions are known to elevate  hsTnI results.  Refer to the "Links" section for chest pain algorithms and additional  guidance. Performed at [facility], [address] [place], [place] [address]     Assessment/Plan:  DM (diabetes mellitus), type 2 ([place]) blood glucose control important in reducing the progression of atherosclerotic disease. Also, involved in wound healing. On appropriate medications.   COPD exacerbation (HCC) Recent steroid taper seems to have worsened her lower extremity swelling significantly  Chronic diastolic CHF (congestive heart failure) (HCC) Obviously is one of the associated factors worsening her lower extremity swelling.  On diuretic therapy.  Swelling of limb I have had a long discussion with the patient regarding swelling and why it  causes symptoms.  Patient will begin wearing graduated compression stockings class 1 (20-30 mmHg) on a daily basis a prescription was given.  Although she has some sort of compression socks, I am not sure there are graduated compression stockings.  The patient will  beginning wearing the stockings  first thing in the morning and removing them in the evening. The patient is instructed specifically not to sleep in the stockings.   In addition, behavioral modification will be initiated.  This will include frequent elevation, use of over the counter pain medications and exercise such as walking.  I have reviewed systemic causes for chronic edema such as liver, kidney and cardiac etiologies.  The patient denies problems with these organ systems.    Consideration for a lymph pump will also be made based upon the effectiveness of conservative therapy.  This would help to improve the edema control and prevent sequela such as ulcers and infections   Patient should undergo duplex ultrasound of the venous system to ensure that DVT or reflux is not present.  The patient will follow-up with me after the ultrasound.        Leotis Pain 05/16/2019, 11:14 AM    This note was created with Dragon medical transcription system.  Any errors from dictation are unintentional.

## 2019-05-16 NOTE — Assessment & Plan Note (Signed)
Recent steroid taper seems to have worsened her lower extremity swelling significantly

## 2019-05-16 NOTE — Assessment & Plan Note (Signed)
I have had a long discussion with the patient regarding swelling and why it  causes symptoms.  Patient will begin wearing graduated compression stockings class 1 (20-30 mmHg) on a daily basis a prescription was given.  Although she has some sort of compression socks, I am not sure there are graduated compression stockings.  The patient will  beginning wearing the stockings first thing in the morning and removing them in the evening. The patient is instructed specifically not to sleep in the stockings.   In addition, behavioral modification will be initiated.  This will include frequent elevation, use of over the counter pain medications and exercise such as walking.  I have reviewed systemic causes for chronic edema such as liver, kidney and cardiac etiologies.  The patient denies problems with these organ systems.    Consideration for a lymph pump will also be made based upon the effectiveness of conservative therapy.  This would help to improve the edema control and prevent sequela such as ulcers and infections   Patient should undergo duplex ultrasound of the venous system to ensure that DVT or reflux is not present.  The patient will follow-up with me after the ultrasound.

## 2019-05-16 NOTE — Patient Instructions (Signed)

## 2019-05-16 NOTE — Assessment & Plan Note (Signed)
Obviously is one of the associated factors worsening her lower extremity swelling.  On diuretic therapy.

## 2019-05-16 NOTE — Assessment & Plan Note (Signed)
blood glucose control important in reducing the progression of atherosclerotic disease. Also, involved in wound healing. On appropriate medications.  

## 2019-05-18 ENCOUNTER — Telehealth: Payer: Self-pay

## 2019-05-18 NOTE — Telephone Encounter (Signed)
Called lmom informing patient of appointment. klh 

## 2019-05-19 ENCOUNTER — Telehealth: Payer: Self-pay

## 2019-05-19 NOTE — Telephone Encounter (Signed)
BILLED MISSED APPT FEE 05/15/2019

## 2019-05-22 ENCOUNTER — Ambulatory Visit: Payer: Medicare Other | Admitting: Internal Medicine

## 2019-05-23 ENCOUNTER — Other Ambulatory Visit (INDEPENDENT_AMBULATORY_CARE_PROVIDER_SITE_OTHER): Payer: Self-pay | Admitting: Vascular Surgery

## 2019-05-23 DIAGNOSIS — M7989 Other specified soft tissue disorders: Secondary | ICD-10-CM

## 2019-05-24 ENCOUNTER — Ambulatory Visit (INDEPENDENT_AMBULATORY_CARE_PROVIDER_SITE_OTHER): Payer: Medicare Other | Admitting: Nurse Practitioner

## 2019-05-24 ENCOUNTER — Encounter (INDEPENDENT_AMBULATORY_CARE_PROVIDER_SITE_OTHER): Payer: Self-pay | Admitting: Nurse Practitioner

## 2019-05-24 ENCOUNTER — Ambulatory Visit (INDEPENDENT_AMBULATORY_CARE_PROVIDER_SITE_OTHER): Payer: Medicare Other

## 2019-05-24 ENCOUNTER — Other Ambulatory Visit: Payer: Self-pay

## 2019-05-24 VITALS — BP 133/85 | HR 86 | Resp 17 | Wt 205.0 lb

## 2019-05-24 DIAGNOSIS — M7989 Other specified soft tissue disorders: Secondary | ICD-10-CM

## 2019-05-24 DIAGNOSIS — M1812 Unilateral primary osteoarthritis of first carpometacarpal joint, left hand: Secondary | ICD-10-CM | POA: Diagnosis not present

## 2019-05-24 DIAGNOSIS — I890 Lymphedema, not elsewhere classified: Secondary | ICD-10-CM

## 2019-05-24 DIAGNOSIS — E119 Type 2 diabetes mellitus without complications: Secondary | ICD-10-CM | POA: Diagnosis not present

## 2019-06-01 IMAGING — CT CT HEAD W/O CM
4 of 7 series · 15 of 47 positions shown, 16 images · non-contrast
Comparison: 12/23/2015 sinus CT.

CLINICAL DATA: Recent fall. Posterior headache and posterior neck
pain.

EXAM:
CT HEAD WITHOUT CONTRAST
CT CERVICAL SPINE WITHOUT CONTRAST
TECHNIQUE: Multidetector CT imaging of the head and cervical spine was
performed following the standard protocol without intravenous
contrast. Multiplanar CT image reconstructions of the cervical spine
were also generated.

[Series 2: head wo · axial · 0.44mm/px · z∈[-103,-23]mm · 3 of 32 slices shown, 4 images]
[im 8/32  brain]
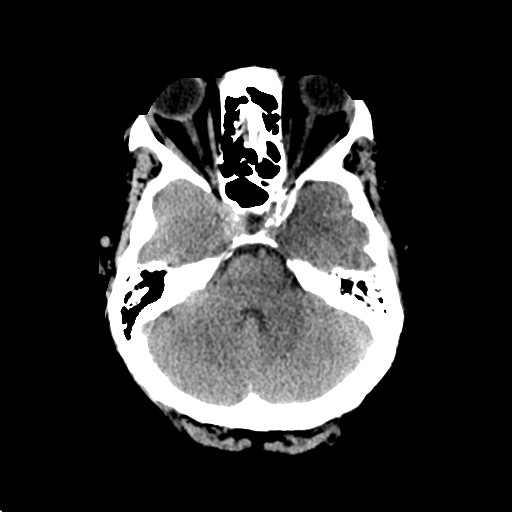
[im 8/32  bone]
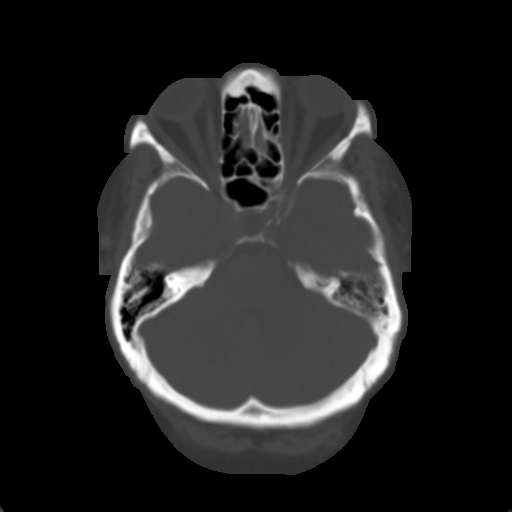
[im 16/32  brain]
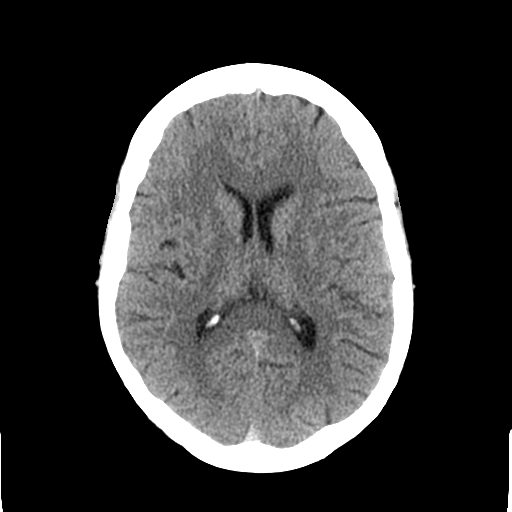
[im 24/32  brain]
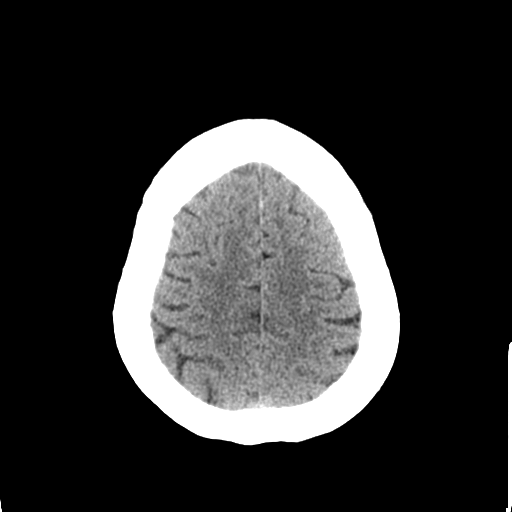

[Series 4: coronal soft tissue · coronal · 0.32mm/px · 3 of 68 slices shown]
[im 23/68  brain]
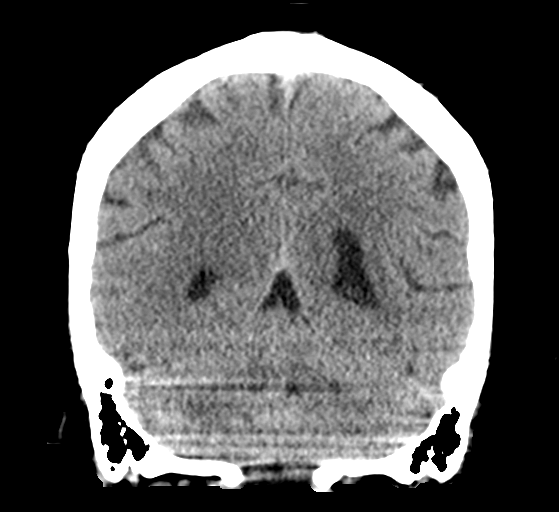
[im 34/68  brain]
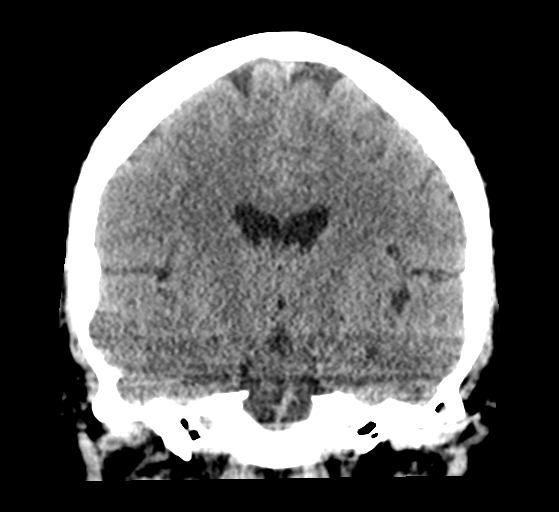
[im 45/68  brain]
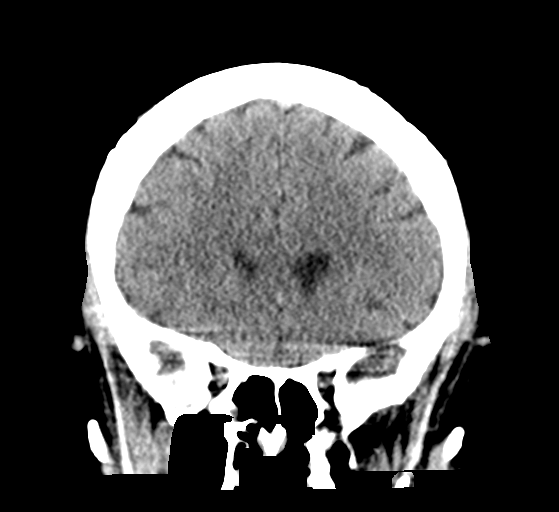

[Series 5: sagittal soft tissue · sagittal · 0.31mm/px · 1 of 60 slices shown]
[im 30/60  brain]
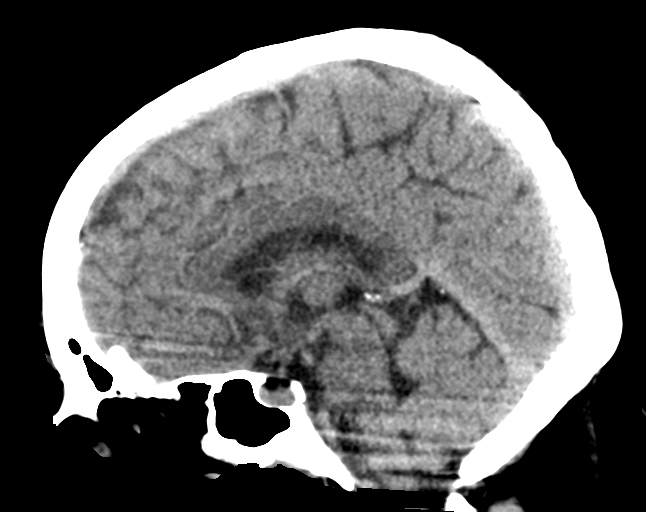

[Series 12: orthogonal bone · axial · 0.23mm/px · z∈[-297,-166]mm · 8 of 94 slices shown]
[im 8/94  bone]
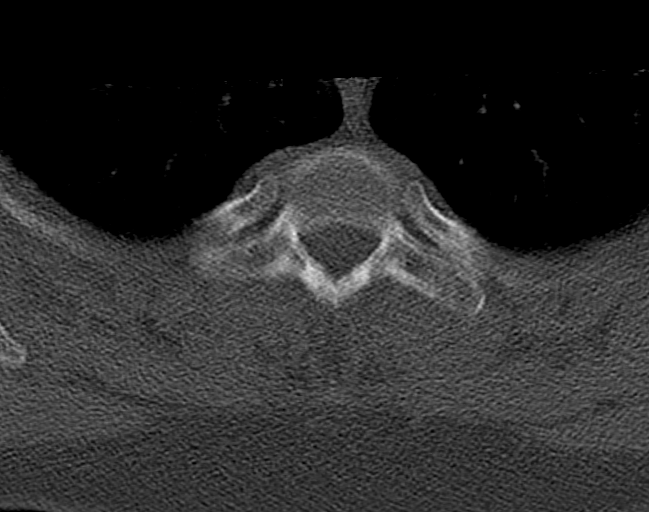
[im 24/94  bone]
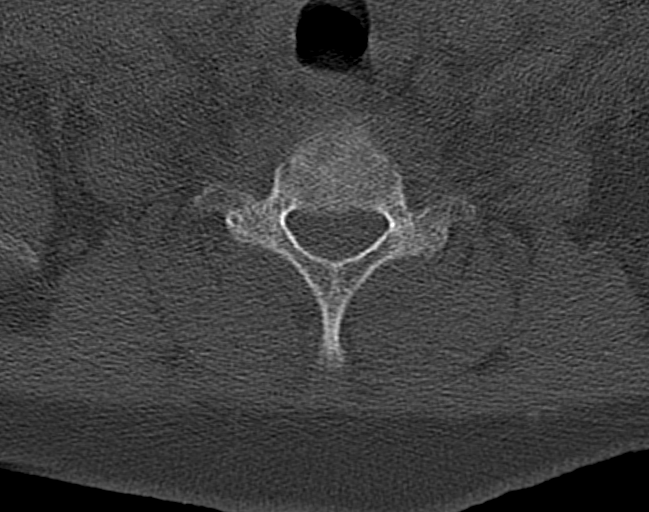
[im 32/94  bone]
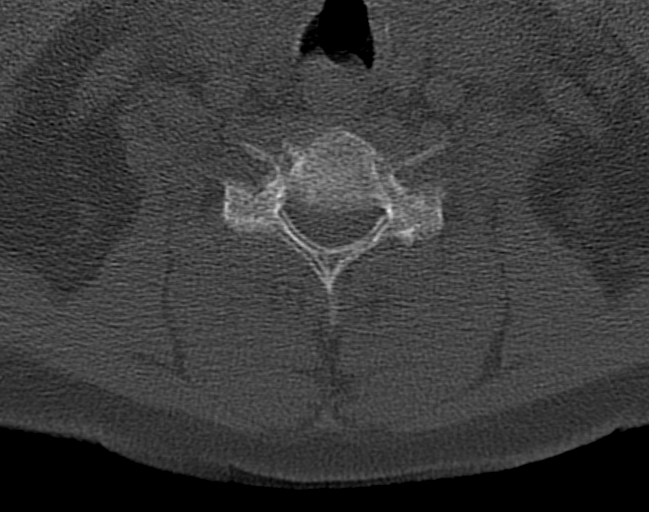
[im 39/94  bone]
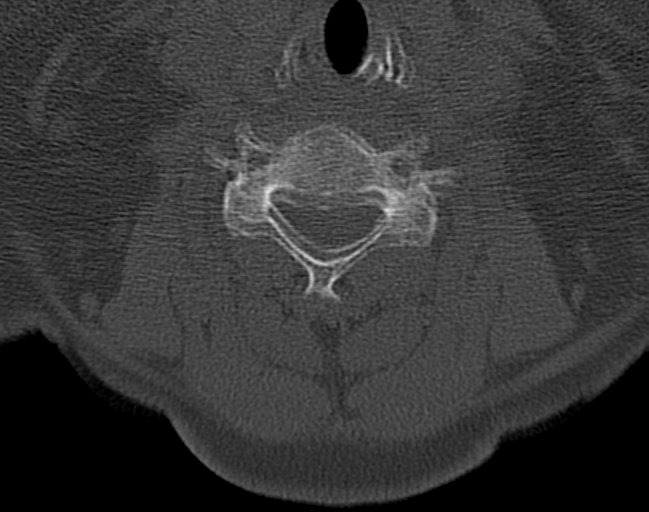
[im 55/94  bone]
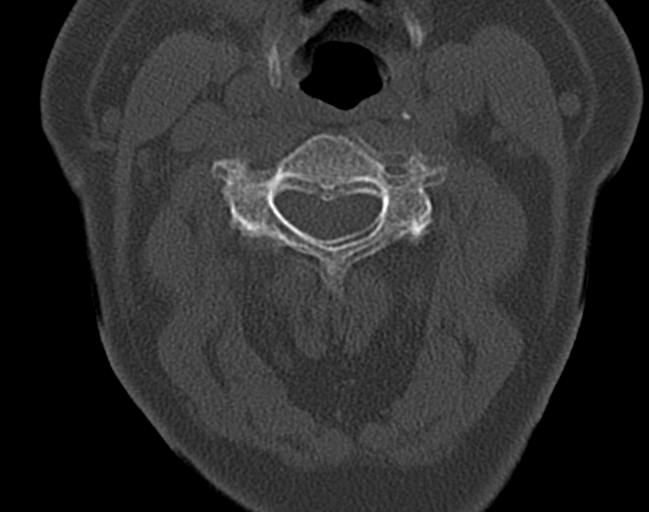
[im 63/94  bone]
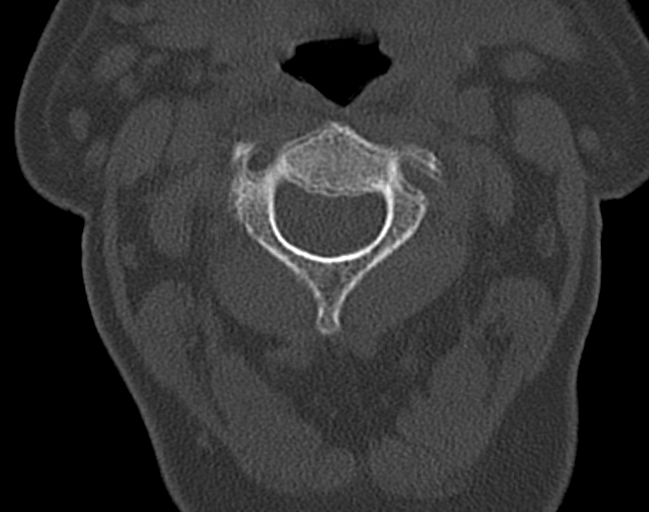
[im 70/94  bone]
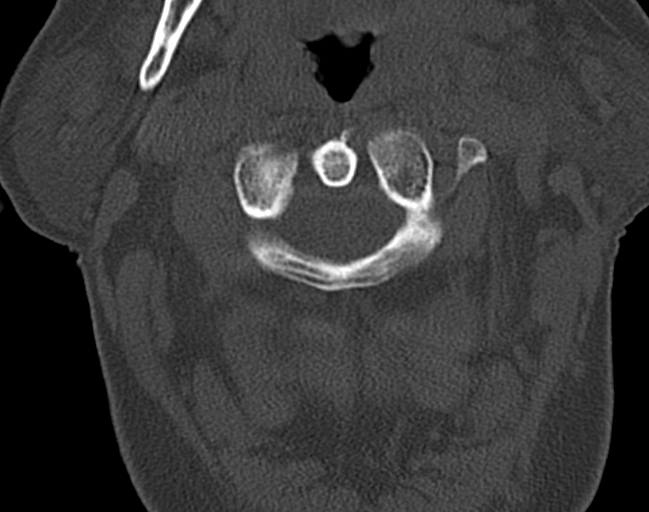
[im 86/94  bone]
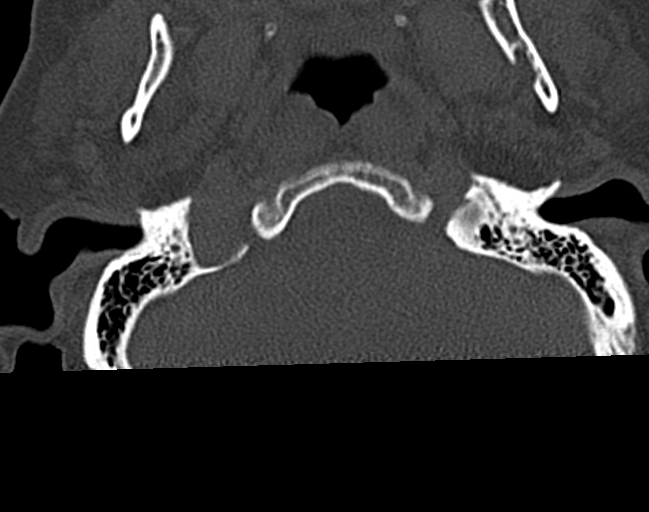

[15 of 47 positions shown; findings below may reference images not displayed]

FINDINGS: CT HEAD FINDINGS

Brain: No evidence of parenchymal hemorrhage or extra-axial fluid
collection. No mass lesion, mass effect, or midline shift. No CT
evidence of acute infarction. Cerebral volume is age appropriate. No
ventriculomegaly.

Vascular: No acute abnormality.

Skull: No evidence of calvarial fracture.

Sinuses/Orbits: The visualized paranasal sinuses are essentially
clear.

Other:  The mastoid air cells are unopacified.

CT CERVICAL SPINE FINDINGS

Motion degraded scan, limiting assessment.

Alignment: Straightening of the cervical spine. No facet
subluxation. Dens is well positioned between the lateral masses of
C1.

Skull base and vertebrae: No acute fracture. No primary bone lesion
or focal pathologic process.

Soft tissues and spinal canal: No prevertebral edema. No visible
canal hematoma.

Disc levels: Mild lower cervical spine degenerative disc disease,
most prominent at C6-7. Mild bilateral facet arthropathy. No
significant degenerative foraminal stenosis.

Upper chest: No acute abnormality.

Other: Visualized mastoid air cells appear clear. No discrete
thyroid nodules. No pathologically enlarged cervical nodes.
IMPRESSION: CT HEAD:

Negative head CT. No evidence of acute intracranial abnormality. No
evidence of calvarial fracture.

CT CERVICAL SPINE:

1. Motion degraded scan. No evidence of cervical spine fracture or
subluxation.
2. Mild multilevel cervical degenerative changes.

## 2019-06-04 ENCOUNTER — Inpatient Hospital Stay
Admission: EM | Admit: 2019-06-04 | Discharge: 2019-06-08 | DRG: 314 | Disposition: A | Discharge status: Home / Self Care | Payer: Medicare Other | Attending: Family Medicine | Admitting: Family Medicine

## 2019-06-04 ENCOUNTER — Encounter: Payer: Self-pay | Admitting: Emergency Medicine

## 2019-06-04 ENCOUNTER — Emergency Department: Payer: Medicare Other

## 2019-06-04 ENCOUNTER — Encounter (INDEPENDENT_AMBULATORY_CARE_PROVIDER_SITE_OTHER): Payer: Self-pay | Admitting: Nurse Practitioner

## 2019-06-04 ENCOUNTER — Inpatient Hospital Stay: Payer: Medicare Other

## 2019-06-04 ENCOUNTER — Other Ambulatory Visit: Payer: Self-pay

## 2019-06-04 ENCOUNTER — Encounter: Payer: Self-pay | Admitting: Radiology

## 2019-06-04 DIAGNOSIS — Y92239 Unspecified place in hospital as the place of occurrence of the external cause: Secondary | ICD-10-CM | POA: Diagnosis present

## 2019-06-04 DIAGNOSIS — J9611 Chronic respiratory failure with hypoxia: Secondary | ICD-10-CM | POA: Diagnosis present

## 2019-06-04 DIAGNOSIS — B954 Other streptococcus as the cause of diseases classified elsewhere: Secondary | ICD-10-CM | POA: Diagnosis not present

## 2019-06-04 DIAGNOSIS — J449 Chronic obstructive pulmonary disease, unspecified: Secondary | ICD-10-CM | POA: Diagnosis not present

## 2019-06-04 DIAGNOSIS — J9811 Atelectasis: Secondary | ICD-10-CM | POA: Diagnosis present

## 2019-06-04 DIAGNOSIS — I9789 Other postprocedural complications and disorders of the circulatory system, not elsewhere classified: Principal | ICD-10-CM | POA: Diagnosis present

## 2019-06-04 DIAGNOSIS — F411 Generalized anxiety disorder: Secondary | ICD-10-CM | POA: Diagnosis present

## 2019-06-04 DIAGNOSIS — J690 Pneumonitis due to inhalation of food and vomit: Secondary | ICD-10-CM | POA: Diagnosis present

## 2019-06-04 DIAGNOSIS — E119 Type 2 diabetes mellitus without complications: Secondary | ICD-10-CM | POA: Diagnosis present

## 2019-06-04 DIAGNOSIS — T790XXA Air embolism (traumatic), initial encounter: Secondary | ICD-10-CM | POA: Diagnosis present

## 2019-06-04 DIAGNOSIS — I509 Heart failure, unspecified: Secondary | ICD-10-CM | POA: Diagnosis not present

## 2019-06-04 DIAGNOSIS — I248 Other forms of acute ischemic heart disease: Secondary | ICD-10-CM | POA: Diagnosis present

## 2019-06-04 DIAGNOSIS — Z20822 Contact with and (suspected) exposure to covid-19: Secondary | ICD-10-CM

## 2019-06-04 DIAGNOSIS — E875 Hyperkalemia: Secondary | ICD-10-CM | POA: Diagnosis present

## 2019-06-04 DIAGNOSIS — R0603 Acute respiratory distress: Secondary | ICD-10-CM

## 2019-06-04 DIAGNOSIS — Z791 Long term (current) use of non-steroidal anti-inflammatories (NSAID): Secondary | ICD-10-CM | POA: Diagnosis not present

## 2019-06-04 DIAGNOSIS — I5032 Chronic diastolic (congestive) heart failure: Secondary | ICD-10-CM | POA: Diagnosis present

## 2019-06-04 DIAGNOSIS — Z87891 Personal history of nicotine dependence: Secondary | ICD-10-CM | POA: Diagnosis not present

## 2019-06-04 DIAGNOSIS — B955 Unspecified streptococcus as the cause of diseases classified elsewhere: Secondary | ICD-10-CM | POA: Diagnosis not present

## 2019-06-04 DIAGNOSIS — Y848 Other medical procedures as the cause of abnormal reaction of the patient, or of later complication, without mention of misadventure at the time of the procedure: Secondary | ICD-10-CM | POA: Diagnosis present

## 2019-06-04 DIAGNOSIS — G4730 Sleep apnea, unspecified: Secondary | ICD-10-CM | POA: Diagnosis present

## 2019-06-04 DIAGNOSIS — Z79899 Other long term (current) drug therapy: Secondary | ICD-10-CM

## 2019-06-04 DIAGNOSIS — G43009 Migraine without aura, not intractable, without status migrainosus: Secondary | ICD-10-CM | POA: Diagnosis present

## 2019-06-04 DIAGNOSIS — E785 Hyperlipidemia, unspecified: Secondary | ICD-10-CM | POA: Diagnosis present

## 2019-06-04 DIAGNOSIS — I890 Lymphedema, not elsewhere classified: Secondary | ICD-10-CM | POA: Diagnosis present

## 2019-06-04 DIAGNOSIS — Z7952 Long term (current) use of systemic steroids: Secondary | ICD-10-CM

## 2019-06-04 DIAGNOSIS — Z20828 Contact with and (suspected) exposure to other viral communicable diseases: Secondary | ICD-10-CM | POA: Diagnosis present

## 2019-06-04 DIAGNOSIS — L270 Generalized skin eruption due to drugs and medicaments taken internally: Secondary | ICD-10-CM | POA: Diagnosis present

## 2019-06-04 DIAGNOSIS — G9349 Other encephalopathy: Secondary | ICD-10-CM | POA: Diagnosis present

## 2019-06-04 DIAGNOSIS — Z9981 Dependence on supplemental oxygen: Secondary | ICD-10-CM

## 2019-06-04 DIAGNOSIS — Z888 Allergy status to other drugs, medicaments and biological substances status: Secondary | ICD-10-CM | POA: Diagnosis not present

## 2019-06-04 DIAGNOSIS — Z7951 Long term (current) use of inhaled steroids: Secondary | ICD-10-CM

## 2019-06-04 DIAGNOSIS — Z90710 Acquired absence of both cervix and uterus: Secondary | ICD-10-CM

## 2019-06-04 DIAGNOSIS — J9621 Acute and chronic respiratory failure with hypoxia: Secondary | ICD-10-CM | POA: Diagnosis present

## 2019-06-04 DIAGNOSIS — J441 Chronic obstructive pulmonary disease with (acute) exacerbation: Secondary | ICD-10-CM | POA: Diagnosis not present

## 2019-06-04 DIAGNOSIS — J9622 Acute and chronic respiratory failure with hypercapnia: Secondary | ICD-10-CM | POA: Diagnosis present

## 2019-06-04 DIAGNOSIS — R7881 Bacteremia: Secondary | ICD-10-CM | POA: Diagnosis not present

## 2019-06-04 DIAGNOSIS — L818 Other specified disorders of pigmentation: Secondary | ICD-10-CM | POA: Diagnosis not present

## 2019-06-04 DIAGNOSIS — Z886 Allergy status to analgesic agent status: Secondary | ICD-10-CM | POA: Diagnosis not present

## 2019-06-04 DIAGNOSIS — G4733 Obstructive sleep apnea (adult) (pediatric): Secondary | ICD-10-CM | POA: Diagnosis not present

## 2019-06-04 DIAGNOSIS — Z4659 Encounter for fitting and adjustment of other gastrointestinal appliance and device: Secondary | ICD-10-CM

## 2019-06-04 DIAGNOSIS — I110 Hypertensive heart disease with heart failure: Secondary | ICD-10-CM | POA: Diagnosis present

## 2019-06-04 LAB — URINALYSIS, COMPLETE (UACMP) WITH MICROSCOPIC
Bilirubin Urine: NEGATIVE
Glucose, UA: 50 mg/dL — AB
Hgb urine dipstick: NEGATIVE
Ketones, ur: NEGATIVE mg/dL
Leukocytes,Ua: NEGATIVE
Nitrite: NEGATIVE
Protein, ur: 100 mg/dL — AB
Specific Gravity, Urine: 1.035 — ABNORMAL HIGH (ref 1.005–1.030)
Squamous Epithelial / HPF: NONE SEEN (ref 0–5)
pH: 5 (ref 5.0–8.0)

## 2019-06-04 LAB — CBC WITH DIFFERENTIAL/PLATELET
Abs Immature Granulocytes: 0 10*3/uL (ref 0.00–0.07)
Basophils Absolute: 0 10*3/uL (ref 0.0–0.1)
Basophils Relative: 0 %
Eosinophils Absolute: 0 10*3/uL (ref 0.0–0.5)
Eosinophils Relative: 0 %
HCT: 54.5 % — ABNORMAL HIGH (ref 36.0–46.0)
Hemoglobin: 16.8 g/dL — ABNORMAL HIGH (ref 12.0–15.0)
Lymphocytes Relative: 31 %
Lymphs Abs: 5 10*3/uL — ABNORMAL HIGH (ref 0.7–4.0)
MCH: 29.2 pg (ref 26.0–34.0)
MCHC: 30.8 g/dL (ref 30.0–36.0)
MCV: 94.6 fL (ref 80.0–100.0)
Monocytes Absolute: 1.1 10*3/uL — ABNORMAL HIGH (ref 0.1–1.0)
Monocytes Relative: 7 %
Neutro Abs: 9.3 10*3/uL — ABNORMAL HIGH (ref 1.7–7.7)
Neutrophils Relative %: 58 %
Other: 4 %
Platelets: 217 10*3/uL (ref 150–400)
RBC: 5.76 MIL/uL — ABNORMAL HIGH (ref 3.87–5.11)
RDW: 16.5 % — ABNORMAL HIGH (ref 11.5–15.5)
WBC: 16 10*3/uL — ABNORMAL HIGH (ref 4.0–10.5)
nRBC: 0.7 % — ABNORMAL HIGH (ref 0.0–0.2)

## 2019-06-04 LAB — PROCALCITONIN: Procalcitonin: 0.11 ng/mL

## 2019-06-04 LAB — COMPREHENSIVE METABOLIC PANEL
ALT: 36 U/L (ref 0–44)
AST: 35 U/L (ref 15–41)
Albumin: 4.1 g/dL (ref 3.5–5.0)
Alkaline Phosphatase: 62 U/L (ref 38–126)
Anion gap: 9 (ref 5–15)
BUN: 16 mg/dL (ref 6–20)
CO2: 36 mmol/L — ABNORMAL HIGH (ref 22–32)
Calcium: 9.2 mg/dL (ref 8.9–10.3)
Chloride: 94 mmol/L — ABNORMAL LOW (ref 98–111)
Creatinine, Ser: 0.82 mg/dL (ref 0.44–1.00)
GFR calc Af Amer: >60 mL/min (ref >60)
GFR calc non Af Amer: >60 mL/min (ref >60)
Glucose, Bld: 162 mg/dL — ABNORMAL HIGH (ref 70–99)
Potassium: 5.4 mmol/L — ABNORMAL HIGH (ref 3.5–5.1)
Sodium: 139 mmol/L (ref 135–145)
Total Bilirubin: 0.6 mg/dL (ref 0.3–1.2)
Total Protein: 7.5 g/dL (ref 6.5–8.1)

## 2019-06-04 LAB — RESPIRATORY PANEL BY RT PCR (FLU A&B, COVID)
Influenza A by PCR: NEGATIVE
Influenza B by PCR: NEGATIVE
SARS Coronavirus 2 by RT PCR: NEGATIVE

## 2019-06-04 LAB — POC SARS CORONAVIRUS 2 AG: SARS Coronavirus 2 Ag: NEGATIVE

## 2019-06-04 LAB — BRAIN NATRIURETIC PEPTIDE: B Natriuretic Peptide: 2488 pg/mL — ABNORMAL HIGH (ref 0.0–100.0)

## 2019-06-04 LAB — FIBRIN DERIVATIVES D-DIMER (ARMC ONLY): Fibrin derivatives D-dimer (ARMC): 1420.29 ng/mL (FEU) — ABNORMAL HIGH (ref 0.00–499.00)

## 2019-06-04 LAB — LACTIC ACID, PLASMA: Lactic Acid, Venous: 1.8 mmol/L (ref 0.5–1.9)

## 2019-06-04 LAB — TROPONIN I (HIGH SENSITIVITY): Troponin I (High Sensitivity): 34 ng/L — ABNORMAL HIGH (ref <18)

## 2019-06-04 MED ORDER — BISACODYL 10 MG RE SUPP
10.0000 mg | Freq: Every day | RECTAL | Status: DC | PRN
  Filled 2019-06-04: qty 1

## 2019-06-04 MED ORDER — METHYLPREDNISOLONE SODIUM SUCC 40 MG IJ SOLR
40.0000 mg | Freq: Two times a day (BID) | INTRAMUSCULAR | Status: DC
  Administered 2019-06-04 – 2019-06-06 (×4): 40 mg via INTRAVENOUS
  Filled 2019-06-04 (×4): qty 1

## 2019-06-04 MED ORDER — IOHEXOL 350 MG/ML SOLN
75.0000 mL | Freq: Once | INTRAVENOUS | Status: AC | PRN
  Administered 2019-06-04: 75 mL via INTRAVENOUS

## 2019-06-04 MED ORDER — ACETAMINOPHEN 325 MG PO TABS
650.0000 mg | ORAL_TABLET | ORAL | Status: DC | PRN
  Administered 2019-06-06 – 2019-06-07 (×2): 650 mg via ORAL
  Filled 2019-06-04 (×2): qty 2

## 2019-06-04 MED ORDER — IPRATROPIUM-ALBUTEROL 0.5-2.5 (3) MG/3ML IN SOLN
3.0000 mL | RESPIRATORY_TRACT | Status: DC
  Administered 2019-06-04 – 2019-06-08 (×21): 3 mL via RESPIRATORY_TRACT
  Filled 2019-06-04 (×12): qty 3
  Filled 2019-06-04: qty 6
  Filled 2019-06-04 (×9): qty 3

## 2019-06-04 MED ORDER — ENOXAPARIN SODIUM 40 MG/0.4ML ~~LOC~~ SOLN
40.0000 mg | SUBCUTANEOUS | Status: DC
  Administered 2019-06-04 – 2019-06-07 (×4): 40 mg via SUBCUTANEOUS
  Filled 2019-06-04 (×4): qty 0.4

## 2019-06-04 MED ORDER — BUDESONIDE 0.5 MG/2ML IN SUSP
0.5000 mg | Freq: Two times a day (BID) | RESPIRATORY_TRACT | Status: DC
  Administered 2019-06-05 – 2019-06-07 (×5): 0.5 mg via RESPIRATORY_TRACT
  Filled 2019-06-04 (×7): qty 2

## 2019-06-04 MED ORDER — ONDANSETRON HCL 4 MG/2ML IJ SOLN
4.0000 mg | Freq: Once | INTRAMUSCULAR | Status: AC
  Administered 2019-06-04: 4 mg via INTRAVENOUS
  Filled 2019-06-04: qty 2

## 2019-06-04 MED ORDER — SODIUM CHLORIDE 0.9 % IV SOLN
3.0000 g | Freq: Four times a day (QID) | INTRAVENOUS | Status: DC
  Administered 2019-06-05 – 2019-06-06 (×6): 3 g via INTRAVENOUS
  Filled 2019-06-04 (×5): qty 8
  Filled 2019-06-04 (×3): qty 3
  Filled 2019-06-04: qty 8

## 2019-06-04 MED ORDER — FENTANYL CITRATE (PF) 100 MCG/2ML IJ SOLN
50.0000 ug | Freq: Once | INTRAMUSCULAR | Status: AC
  Administered 2019-06-04: 50 ug via INTRAVENOUS

## 2019-06-04 MED ORDER — PROPOFOL 1000 MG/100ML IV EMUL
INTRAVENOUS | Status: AC
  Filled 2019-06-04: qty 100

## 2019-06-04 MED ORDER — FENTANYL CITRATE (PF) 100 MCG/2ML IJ SOLN
INTRAMUSCULAR | Status: AC
  Filled 2019-06-04: qty 2

## 2019-06-04 MED ORDER — IPRATROPIUM-ALBUTEROL 0.5-2.5 (3) MG/3ML IN SOLN: 3.0000 mL | Freq: Four times a day (QID) | RESPIRATORY_TRACT | Status: DC | PRN

## 2019-06-04 MED ORDER — DIPHENHYDRAMINE HCL 50 MG/ML IJ SOLN
25.0000 mg | Freq: Once | INTRAMUSCULAR | Status: AC
  Administered 2019-06-04: 25 mg via INTRAVENOUS
  Filled 2019-06-04: qty 1

## 2019-06-04 MED ORDER — IPRATROPIUM-ALBUTEROL 0.5-2.5 (3) MG/3ML IN SOLN
3.0000 mL | Freq: Once | RESPIRATORY_TRACT | Status: AC
  Filled 2019-06-04: qty 3

## 2019-06-04 MED ORDER — SODIUM CHLORIDE 0.9 % IV SOLN
500.0000 mg | Freq: Once | INTRAVENOUS | Status: AC
  Administered 2019-06-04: 500 mg via INTRAVENOUS
  Filled 2019-06-04: qty 500

## 2019-06-04 MED ORDER — DOCUSATE SODIUM 50 MG/5ML PO LIQD
100.0000 mg | Freq: Two times a day (BID) | ORAL | Status: DC | PRN
  Filled 2019-06-04: qty 10

## 2019-06-04 MED ORDER — FUROSEMIDE 10 MG/ML IJ SOLN
20.0000 mg | Freq: Once | INTRAMUSCULAR | Status: AC
  Administered 2019-06-04: 20 mg via INTRAVENOUS
  Filled 2019-06-04: qty 4

## 2019-06-04 MED ORDER — FENTANYL CITRATE (PF) 100 MCG/2ML IJ SOLN
50.0000 ug | INTRAMUSCULAR | Status: DC | PRN
  Administered 2019-06-05: 50 ug via INTRAVENOUS
  Filled 2019-06-04: qty 2

## 2019-06-04 MED ORDER — FAMOTIDINE IN NACL 20-0.9 MG/50ML-% IV SOLN
20.0000 mg | Freq: Two times a day (BID) | INTRAVENOUS | Status: DC
  Administered 2019-06-04 – 2019-06-06 (×4): 20 mg via INTRAVENOUS
  Filled 2019-06-04 (×4): qty 50

## 2019-06-04 MED ORDER — IPRATROPIUM-ALBUTEROL 0.5-2.5 (3) MG/3ML IN SOLN
RESPIRATORY_TRACT | Status: AC
  Administered 2019-06-04: 3 mL via RESPIRATORY_TRACT
  Filled 2019-06-04: qty 9

## 2019-06-04 MED ORDER — KETOROLAC TROMETHAMINE 30 MG/ML IJ SOLN
30.0000 mg | Freq: Once | INTRAMUSCULAR | Status: AC
  Administered 2019-06-04: 30 mg via INTRAVENOUS
  Filled 2019-06-04: qty 1

## 2019-06-04 MED ORDER — SODIUM CHLORIDE 0.9 % IV SOLN
1.0000 g | Freq: Once | INTRAVENOUS | Status: AC
  Administered 2019-06-04: 1 g via INTRAVENOUS
  Filled 2019-06-04: qty 10

## 2019-06-04 MED ORDER — SODIUM CHLORIDE 0.9 % IV BOLUS
500.0000 mL | Freq: Once | INTRAVENOUS | Status: AC
  Administered 2019-06-04: 500 mL via INTRAVENOUS

## 2019-06-04 MED ORDER — METHYLPREDNISOLONE SODIUM SUCC 125 MG IJ SOLR
INTRAMUSCULAR | Status: AC
  Administered 2019-06-04: 125 mg
  Filled 2019-06-04: qty 2

## 2019-06-04 MED ORDER — FENTANYL CITRATE (PF) 100 MCG/2ML IJ SOLN: 50.0000 ug | INTRAMUSCULAR | Status: DC | PRN

## 2019-06-04 MED ORDER — INSULIN ASPART 100 UNIT/ML ~~LOC~~ SOLN
0.0000 [IU] | SUBCUTANEOUS | Status: DC
  Administered 2019-06-05: 3 [IU] via SUBCUTANEOUS
  Administered 2019-06-05: 2 [IU] via SUBCUTANEOUS
  Administered 2019-06-05 (×4): 3 [IU] via SUBCUTANEOUS
  Administered 2019-06-05: 2 [IU] via SUBCUTANEOUS
  Administered 2019-06-06 (×3): 3 [IU] via SUBCUTANEOUS
  Administered 2019-06-06 (×2): 2 [IU] via SUBCUTANEOUS
  Administered 2019-06-06: 5 [IU] via SUBCUTANEOUS
  Administered 2019-06-07: 3 [IU] via SUBCUTANEOUS
  Filled 2019-06-04 (×14): qty 1

## 2019-06-04 MED ORDER — IPRATROPIUM-ALBUTEROL 0.5-2.5 (3) MG/3ML IN SOLN
3.0000 mL | Freq: Once | RESPIRATORY_TRACT | Status: AC
  Administered 2019-06-04: 3 mL via RESPIRATORY_TRACT
  Filled 2019-06-04: qty 3

## 2019-06-04 MED ORDER — PROPOFOL 1000 MG/100ML IV EMUL
5.0000 ug/kg/min | INTRAVENOUS | Status: DC
  Administered 2019-06-04: 20 ug/kg/min via INTRAVENOUS
  Administered 2019-06-05: 40 ug/kg/min via INTRAVENOUS
  Administered 2019-06-05 (×2): 50 ug/kg/min via INTRAVENOUS
  Filled 2019-06-04 (×4): qty 100

## 2019-06-04 MED ORDER — SODIUM CHLORIDE 0.9 % IV BOLUS: 1000.0000 mL | Freq: Once | INTRAVENOUS | Status: DC

## 2019-06-04 MED ORDER — MAGNESIUM SULFATE 2 GM/50ML IV SOLN
INTRAVENOUS | Status: AC
  Administered 2019-06-04: 2 g
  Filled 2019-06-04: qty 50

## 2019-06-04 NOTE — ED Notes (Signed)
Rt with new respiratory emergency- unable to take pt to ct at this time.

## 2019-06-04 NOTE — ED Provider Notes (Addendum)
[name] Emergency Department Provider Note  ____________________________________________   First MD Initiated Contact with Patient 06/04/19 1807     (approximate)  I have reviewed the triage vital signs and the nursing notes.   HISTORY  Chief Complaint Headache    HPI Shelly [name] is a 58 y.o. female presents emergency department complaining of a headache for 2 days at the back of her head.  She states this is typical of her migraines but is been feeling bad for 2 days now.  She states her grandson did test positive for Covid.  She is usually on oxygen at home with 3 L.  She is not had to increase her oxygen use.  She denies any cough.  She denies chest pain or shortness of breath.  She is here only for her headache.  She states she usually gets a shot and they sent her home.    Past Medical History:  Diagnosis Date  . Asthma   . CHF (congestive heart failure) ([place])   . COPD (chronic obstructive pulmonary disease) ([place])   . Diabetes mellitus without complication ([place])   . Hypertension   . Sleep apnea     Patient Active Problem List   Diagnosis Date Noted  . Swelling of limb 05/16/2019  . Chronic diastolic CHF (congestive heart failure) ([place]) 03/07/2019  . Chronic respiratory failure with hypoxia ([place]) 03/07/2019  . Generalized anxiety disorder 03/02/2019  . Acute on chronic respiratory failure with hypoxia and hypercapnia ([place]) 02/27/2019  . Cervical spondylosis 06/20/2018  . Occipital neuralgia of right side 06/20/2018  . Primary osteoarthritis of first carpometacarpal joint of left hand 06/20/2018  . Closed fracture of distal end of radius 03/02/2018  . DM (diabetes mellitus), type 2 ([place]) 09/08/2016  . COPD exacerbation ([place]) 09/07/2016  . Neck pain 04/02/2016  . Abdominal pain 01/04/2014    Past Surgical History:  Procedure Laterality Date  . ABDOMINAL HYSTERECTOMY    . COLON SURGERY      Prior to Admission medications     Medication Sig Start Date End Date Taking? Authorizing Provider  albuterol (VENTOLIN HFA) 108 (90 Base) MCG/ACT inhaler Inhale 2 puffs into the lungs every 6 (six) hours as needed for wheezing or shortness of breath. 03/06/19   Fritzi Mandes, MD  azithromycin [facility]) 250 MG tablet Use as directed 04/26/19   Kendell Bane, NP  baclofen (LIORESAL) 10 MG tablet Take 10 mg by mouth 3 (three) times daily. 01/13/19   [provider]  busPIRone (BUSPAR) 10 MG tablet Take 10 mg by mouth 2 (two) times daily. 04/14/19   [provider]  cetirizine (ZYRTEC) 10 MG tablet cetirizine 10 mg tablet  take 1 tablet by mouth once daily    [provider]  chlorpheniramine-HYDROcodone (TUSSIONEX PENNKINETIC ER) 10-8 MG/5ML SUER Take 5 mLs by mouth at bedtime as needed for cough. 05/01/19   Kendell Bane, NP  clonazePAM (KLONOPIN) 0.5 MG tablet Take 1 tablet (0.5 mg total) by mouth 2 (two) times daily. 04/26/19   Kendell Bane, NP  DULoxetine (CYMBALTA) 30 MG capsule Take 1 capsule (30 mg total) by mouth 2 (two) times daily. 03/03/19   Max Sane, MD  escitalopram (LEXAPRO) 10 MG tablet Take 10 mg by mouth daily. 04/14/19   [provider]  fluticasone furoate-vilanterol (BREO ELLIPTA) 200-25 MCG/INH AEPB Inhale 1 puff into the lungs daily. 09/09/16   Fritzi Mandes, MD  Fluticasone-Salmeterol (ADVAIR DISKUS) 250-50 MCG/DOSE AEPB Inhale 1  puff into the lungs 2 (two) times daily. 03/06/19 03/05/20  Fritzi Mandes, MD  furosemide (LASIX) 40 MG tablet Take 40 mg by mouth 2 (two) times daily. 04/05/19   [provider]  ipratropium-albuterol (DUONEB) 0.5-2.5 (3) MG/3ML SOLN Inhale 3 mLs into the lungs 4 (four) times daily. 03/20/19 03/14/20  [provider]  lisinopril (ZESTRIL) 20 MG tablet Take 20 mg by mouth daily. 01/04/19   [provider]  naproxen (NAPROSYN) 500 MG tablet Take 500 mg by mouth 2 (two) times daily. 03/21/19   [provider]   predniSONE (DELTASONE) 10 MG tablet Take one tab by mouth daily. 05/01/19   Kendell Bane, NP    Allergies Patient has no known allergies.  Family History  Adopted: Yes  Family history unknown: Yes    Social History Social History   Tobacco Use  . Smoking status: Former Research scientist (life sciences)  . Smokeless tobacco: Never Used  Substance Use Topics  . Alcohol use: No  . Drug use: No    Review of Systems  Constitutional: No fever/chills, positive headache Eyes: No visual changes. ENT: No sore throat. Respiratory: Denies cough Genitourinary: Negative for dysuria. Musculoskeletal: Negative for back pain. Skin: Negative for rash.    ____________________________________________   PHYSICAL EXAM:  VITAL SIGNS: ED Triage Vitals  Enc Vitals Group     BP 06/04/19 1631 132/87     Pulse Rate 06/04/19 1631 (!) 106     Resp 06/04/19 1631 18     Temp 06/04/19 1631 99.9 F (37.7 C)     Temp Source 06/04/19 1631 Oral     SpO2 06/04/19 1631 97 %     Weight 06/04/19 1632 205 lb (93 kg)     Height 06/04/19 1632 5\' 5"  (1.651 m)     Head Circumference --      Peak Flow --      Pain Score 06/04/19 1632 10     Pain Loc --      Pain Edu? --      Excl. in [place]? --     Constitutional: Alert and oriented. Well appearing and in no acute distress. Eyes: Conjunctivae are normal.  Head: Atraumatic. Nose: No congestion/rhinnorhea. Mouth/Throat: Mucous membranes are moist.   Neck:  supple no lymphadenopathy noted Cardiovascular: Normal rate, regular rhythm. Heart sounds are normal Respiratory: Normal respiratory effort.  No retractions, lungs c t a  GU: deferred Musculoskeletal: FROM all extremities, warm and well perfused Neurologic:  Normal speech and language.  Skin:  Skin is warm, dry and intact. No rash noted. Psychiatric: Mood and affect are normal. Speech and behavior are normal.  ____________________________________________   LABS (all labs ordered are listed, but only abnormal  results are displayed)  Labs Reviewed  SARS CORONAVIRUS 2 (TAT 6-24 HRS)  COMPREHENSIVE METABOLIC PANEL  BRAIN NATRIURETIC PEPTIDE  CBC WITH DIFFERENTIAL/PLATELET  BLOOD GAS, VENOUS  TROPONIN I (HIGH SENSITIVITY)   ____________________________________________   ____________________________________________  RADIOLOGY    ____________________________________________   PROCEDURES  Procedure(s) performed: Saline lock, normal saline 500 mL IV, Toradol 30 mg IV, Benadryl 25 mg IV, Zofran 4 mg IV.    Procedures    ____________________________________________   INITIAL IMPRESSION / ASSESSMENT AND PLAN / ED COURSE  Pertinent labs & imaging results that were available during my care of the patient were reviewed by me and considered in my medical decision making (see chart for details).   Patient is 58 year old female presents emergency department complaint of migraine headache.  Similar to previous headaches.  Patient has COPD.  She states she was exposed to her grandson who was tested positive for Covid yesterday.  Physical exam shows patient to be nontoxic.  Pupils equal round and reactive to light.  Skull is nontender.  Neurovascular is intact.  Cranial nerves II through XII grossly intact.  Saline lock, normal saline 500 mL IV, Zofran 4 mg IV, Toradol 30 mg IV, and Benadryl 25 mg IV.  ----------------------------------------- 6:50 PM on 06/04/2019 -----------------------------------------  Patient's oxygen levels dropped to 82% on 3 L with nasal cannula.  Due to the questionable Covid and headache we did hold the fluids.  Not give her any IV saline at this time.  Still gave her the medications to help with the headache.  I did order a chest x-ray, EKG, labs which include CBC, metabolic panel, troponin, venous blood gas Patient began to slump and snore.  We took her to the major side probably.  Dr. Joan Mayans and with the patient.  Respiratory in with patient.  Patient will be  moved to the major side at this time.    Shelly [name] was evaluated in Emergency Department on 06/04/2019 for the symptoms described in the history of present illness. She was evaluated in the context of the global COVID-19 pandemic, which necessitated consideration that the patient might be at risk for infection with the SARS-CoV-2 virus that causes COVID-19. Institutional protocols and algorithms that pertain to the evaluation of patients at risk for COVID-19 are in a state of rapid change based on information released by regulatory bodies including the CDC and federal and state organizations. These policies and algorithms were followed during the patient's care in the ED.   As part of my medical decision making, I reviewed the following data within the [place] notes reviewed and incorporated, Old chart reviewed, Notes from prior ED visits and [place] Controlled Substance Database  ____________________________________________   FINAL CLINICAL IMPRESSION(S) / ED DIAGNOSES  Final diagnoses:  Migraine without aura and with status migrainosus, not intractable  Suspected COVID-19 virus infection      NEW MEDICATIONS STARTED DURING THIS VISIT:  New Prescriptions   No medications on file     Note:  This document was prepared using Dragon voice recognition software and may include unintentional dictation errors.    Versie Starks, PA-C 06/04/19 1852    Versie Starks, PA-C 06/04/19 Docia Chuck    Lilia Pro., MD 06/05/19 [phone]

## 2019-06-04 NOTE — ED Provider Notes (Signed)
58 year old female with history of heart failure, COPD, DM, asthma who initially presented to the emergency department complaining of a migraine headache.  Also requested COVID testing due to exposure from her grandson. Per husband, had been having some increased WOB over last several days and notices some leg swelling as well.  On initial arrival, she was on her home 3-4 L nasal cannula with no increased work of breathing.  She denied any shortness of breath or cough.  She was receiving treatment for her headache, when she became hypoxic on her 3 L down to the low 80s, placed on NRB, but quickly became somnolent, sonorous, unable to arouse as well as gray/cyanotic.  Immediately moved from [place] to room 24. Initiated bagging with equal chest rise but very minimal air movement bilaterally.  Strong radial pulse throughout.  GCS 3, patient not protecting her airway, therefore she was intubated.   Given hx of COPD and decreased air movement bilaterally, initiated DuoNeb treatments through ventilator, Solu-Medrol, magnesium.    Concern for COPD exacerbation versus other pulmonary infection versus COVID given her exposure.    Given severity of her respiratory distress, will cover with antibiotics.  Additional blood work, including cultures, lactic, ABG ordered.  Propofol drip.  8:39 PM Labs reveal leukocytosis to 16.  Significantly elevated BNP at 2488. Lasix ordered.  Antigen COVID testing negative.  CXR largely unremarkable.  Will obtain CT PE to rule out, as well as CT head given her acute change.  ABG after intubation reveals normal pH 7.38, hypercarbia 61, pO2 213, will decrease FiO2.  Much improved air movement after steroids, nebulizer treatments, magnesium, still with wheezing bilaterally but much improved compared to preintubation.   Discussed with ICU, who will admit.   Updated husband on patient's current status and plan of care via phone.   EKG Rate: 109 Rhythm: sinus Axis: borderline  right Intervals: RBBB No STEMI   Radiology CXR: IMPRESSION:  Bibasilar scarring without acute abnormalities.     .Critical Care Performed by: Lilia Pro., MD Authorized by: Lilia Pro., MD   Critical care provider statement:    Critical care time (minutes):  45   Critical care was necessary to treat or prevent imminent or life-threatening deterioration of the following conditions:  Respiratory failure   Critical care was time spent personally by me on the following activities:  Discussions with consultants, evaluation of patient's response to treatment, examination of patient, ordering and performing treatments and interventions, ordering and review of laboratory studies, ordering and review of radiographic studies, pulse oximetry, re-evaluation of patient's condition, obtaining history from patient or surrogate and review of old charts Procedure Name: Intubation Date/Time: 06/04/2019 7:55 PM Performed by: Lilia Pro., MD Pre-anesthesia Checklist: Patient identified, Patient being monitored, Emergency Drugs available and Suction available Oxygen Delivery Method: Ambu bag Preoxygenation: Pre-oxygenation with 100% oxygen Induction Type: Rapid sequence Ventilation: Mask ventilation without difficulty Laryngoscope Size: Glidescope and 4 Tube size: 7.5 mm Number of attempts: 1 Placement Confirmation: ETT inserted through vocal cords under direct vision,  CO2 detector,  Breath sounds checked- equal and bilateral and Positive ETCO2         Lilia Pro., MD 06/05/19 [phone]

## 2019-06-04 NOTE — ED Notes (Signed)
20 of etomidate and 100 of rocuronium given IV at this time for intubation.

## 2019-06-04 NOTE — ED Notes (Signed)
Attempted to call report at this time- CCU states they are still in the process of moving a patient to be able to accept. Extension given, unit states they will call when room is ready.

## 2019-06-04 NOTE — ED Triage Notes (Addendum)
First Nurse Note:  Arrives via [facility] with c/o headache x2 days.  EMS reports that patient has also been more lethargic.  AAOx3.  Skin warm and dry.  EMS reports patient is slightly 'slow' to answer questions.  Patient wears home o2 3l.  Sats 91%.  CBG 138.  Patient was exposed to grandson who test positive to COVID on christmas.

## 2019-06-04 NOTE — H&P (Signed)
Name: Shelly [name] MRN: [id number] DOB: January 10, 1961    ADMISSION DATE:  06/04/2019 CONSULTATION DATE: 12/27  REFERRING MD : Dr. Royden Purl   CHIEF COMPLAINT: Headache   BRIEF PATIENT DESCRIPTION: 58 yo female admitted with headaches, acute encephalopathy, and acute on chronic hypoxic hypercapnic respiratory failure secondary to possible air embolism due to CTA Chest findings of small amount of gas in the right atrium and central pulmonary artery as well as in the superficial veins of the upper chest, finding likely iatrogenic and related to intravenous access and aspiration requiring mechanical intubation   SIGNIFICANT EVENT/STUDIES:  12/27: Pt admitted to ICU with acute on chronic hypoxic hypercapnic respiratory failure requiring mechanical intubation  12/27: CT Head revealed no acute intracranial abnormality. 12/27: CTA Chest revealed negative for acute pulmonary embolus. Small amount of gas in the right atrium and central pulmonary artery as well as in the superficial veins of the upper chest. Finding is likely iatrogenic and related to intravenous access. Consolidated lung in the left base with volume loss, much of which is likely atelectatic however some mixed hypoattenuation of the consolidative lung could suggest underlying infection or aspiration particularly given a patulous, fluid-filled esophagus despite NG placement. Stable bilateral adrenal adenomas. Trace amount of perihepatic ascites, new from prior. Aortic Atherosclerosis  MICRO DATA: COVID-19 12/27>>negative  Influenza PCR 12/27>>negative  Blood x2 12/27>> Urine 12/27>>  ABX: Azithromycin x1 dose 12/27 Ceftriaxone x1 dose 12/27 Unasyn 12/27>>  HISTORY OF PRESENT ILLNESS:   This is a 58 yo female with a PMH of OSA, HTN, COPD, Headaches, Chronic Home O2 '@2' -3LType II Diabetes Mellitus, Asthma, Former Smoker, Atypical Chest Pain, Morbid Obesity, and CHF with preserved LV function (Echo 02/2019 EF 60 to 65%).  She presented  to [facility] ER via EMS on 12/27 with c/o headache onset 2 days prior to presentation.  Per ER notes EMS reported pt lethargic and "slow" to answer questions upon their arrival at her home. She wears chronic home O2, and on 3L her O2 sats were 91%.  There was also concern regarding COVID-19 exposure because her grandson tested positive, and she had been around him on Christmas.  Per ER notes on arrival pt initially alert, oriented, and O2 sats were 97% on 3L. She received 500 ml NS bolus, iv zofran, 30 mg iv toradol, and 25 mg iv benadryl.  However, she later became hypoxic with O2 sats 82% on 3L and unresponsive with snoring respirations requiring mechanical intubation.  Lab results revealed K+ 5.4, glucose 162, BNP 2,488, troponin 34, wbc 16.0, and UA negative for UTI.  Post intubation ABG resulted: pH 7.38/pCO2 61/pO2 213/acid-base excess 8.7/bicarb 36.1.  Influenza PCR and COVID-19 negative.  CXR negative for acute abnormalities, but revealed bibasilar scarring.  She received ceftriaxone, azithromycin, and duoneb x1 dose.  CT Head negative. CTA Chest negative for pulmonary embolism, however revealed small amount of gas in the right atrium and central pulmonary artery as well as in the superficial veins of the upper chest, findings likely iatrogenic and related to intravenous access concerning for possible air embolism.  She was subsequently admitted to ICU for additional workup and treatment.    PAST MEDICAL HISTORY :   has a past medical history of Asthma, CHF (congestive heart failure) ([place]), COPD (chronic obstructive pulmonary disease) ([place]), Diabetes mellitus without complication ([place]), Hypertension, and Sleep apnea.  has a past surgical history that includes Colon surgery and Abdominal hysterectomy. Prior to Admission medications   Medication Sig Start Date End Date  Taking? Authorizing Provider  albuterol (VENTOLIN HFA) 108 (90 Base) MCG/ACT inhaler Inhale 2 puffs into the lungs every 6 (six) hours as  needed for wheezing or shortness of breath. 03/06/19   Fritzi Mandes, MD  azithromycin [facility]) 250 MG tablet Use as directed 04/26/19   Kendell Bane, NP  baclofen (LIORESAL) 10 MG tablet Take 10 mg by mouth 3 (three) times daily. 01/13/19   [provider]  busPIRone (BUSPAR) 10 MG tablet Take 10 mg by mouth 2 (two) times daily. 04/14/19   [provider]  cetirizine (ZYRTEC) 10 MG tablet cetirizine 10 mg tablet  take 1 tablet by mouth once daily    [provider]  chlorpheniramine-HYDROcodone (TUSSIONEX PENNKINETIC ER) 10-8 MG/5ML SUER Take 5 mLs by mouth at bedtime as needed for cough. 05/01/19   Kendell Bane, NP  clonazePAM (KLONOPIN) 0.5 MG tablet Take 1 tablet (0.5 mg total) by mouth 2 (two) times daily. 04/26/19   Kendell Bane, NP  DULoxetine (CYMBALTA) 30 MG capsule Take 1 capsule (30 mg total) by mouth 2 (two) times daily. 03/03/19   Max Sane, MD  escitalopram (LEXAPRO) 10 MG tablet Take 10 mg by mouth daily. 04/14/19   [provider]  fluticasone furoate-vilanterol (BREO ELLIPTA) 200-25 MCG/INH AEPB Inhale 1 puff into the lungs daily. 09/09/16   Fritzi Mandes, MD  Fluticasone-Salmeterol (ADVAIR DISKUS) 250-50 MCG/DOSE AEPB Inhale 1 puff into the lungs 2 (two) times daily. 03/06/19 03/05/20  Fritzi Mandes, MD  furosemide (LASIX) 40 MG tablet Take 40 mg by mouth 2 (two) times daily. 04/05/19   [provider]  ipratropium-albuterol (DUONEB) 0.5-2.5 (3) MG/3ML SOLN Inhale 3 mLs into the lungs 4 (four) times daily. 03/20/19 03/14/20  [provider]  lisinopril (ZESTRIL) 20 MG tablet Take 20 mg by mouth daily. 01/04/19   [provider]  naproxen (NAPROSYN) 500 MG tablet Take 500 mg by mouth 2 (two) times daily. 03/21/19   [provider]  predniSONE (DELTASONE) 10 MG tablet Take one tab by mouth daily. 05/01/19   Kendell Bane, NP   No Known Allergies  FAMILY HISTORY:  She was adopted. Family history is unknown  by patient. SOCIAL HISTORY:  reports that she has quit smoking. She has never used smokeless tobacco. She reports that she does not drink alcohol or use drugs.  REVIEW OF SYSTEMS:   Unable to assess pt mechanically intubated   SUBJECTIVE:  Unable to assess pt mechanically intubated  VITAL SIGNS: Temp:  [94.6 F (34.8 C)-99.9 F (37.7 C)] 99.3 F (37.4 C) (12/27 2100) Pulse Rate:  [76-117] 86 (12/27 2100) Resp:  [12-23] 20 (12/27 2100) BP: (97-157)/(76-118) 98/76 (12/27 2100) SpO2:  [82 %-100 %] 93 % (12/27 2100) FiO2 (%):  [70 %] 70 % (12/27 1915) Weight:  [93 kg] 93 kg (12/27 1632)  PHYSICAL EXAMINATION: General: acutely ill appearing female, NAD mechanically intubated  Neuro: sedated, not following commands, PERRL  HEENT: bilateral sclera errythemia, supple, no JVD  Cardiovascular: nsr, rrr, no R/G  Lungs: diminished throughout, even , non labored  Abdomen: +BS x4, soft, non distended, non tender  Musculoskeletal: normal bulk and tone, no edema  Skin: intact no rashes or lesions present   Recent Labs  Lab 06/04/19 1910  NA 139  K 5.4*  CL 94*  CO2 36*  BUN 16  CREATININE 0.82  GLUCOSE 162*   Recent Labs  Lab 06/04/19 1910  HGB 16.8*  HCT 54.5*  WBC 16.0*  PLT  57   DG Chest Portable 1 View  Result Date: 06/04/2019 CLINICAL DATA:  Headache, respiratory failure, became unresponsive, family positive for COVID-19; past history asthma, CHF, COPD, diabetes mellitus, hypertension EXAM: PORTABLE CHEST 1 VIEW COMPARISON:  Portable exam 1903 hours compared to 04/20/2019 FINDINGS: Tip of endotracheal tube projects 5.6 cm above carina. External pacing leads project over chest. Normal heart size and mediastinal contours. Atherosclerotic calcification aorta. Chronic accentuation of bibasilar markings. No definite acute infiltrate, pleural effusion or pneumothorax. IMPRESSION: Bibasilar scarring without acute abnormalities. Electronically Signed   By: Lavonia Chinaza Rooke M.D.   On:  06/04/2019 19:32    ASSESSMENT / PLAN:  Acute on chronic hypercapnic hypoxic respiratory failure secondary to possible air embolism based on CTA Chest findings as described above and aspiration  Mechanical intubation  Hx: OSA and Asthma Full vent support for now-vent settings reviewed and established SBT once all parameters met  VAP bundle implemented  Scheduled and prn bronchodilator therapy   Hypertension-controlled Mildly elevated troponin likely demand ischemia in setting of acute on chronic respiratory failure  Hx: HLD and Chronic CHF with preserved LV function  Continuous telemetry monitoring  Will hold outpatient antihypertensives for now  Trend troponin's   Hyperkalemia etiology unknown  Trend BMP  Monitor for EKG changes Monitor UOP   Leukocytosis possibly secondary to aspiration pneumonia  Trend WBC and monitor fever curve  Trend PCT Follow cultures  Will start unasyn   Type II Diabetes Mellitus  CBG's q4hrs  SSI   Acute encephalopathy possibly secondary to hypoxia concerning for possible ischemic CVA  Mechanical intubation pain/discomfort  Acute pain secondary to headaches (CT Head 12/27 negative for acute abnormality.  Pt has a hx of headaches)  Maintain RASS goal -1 to -2  Propofol gtt and prn fentanyl to maintain RASS goal and for pain management  WUA daily Urine drug screen and alcohol level pending  Daily aspirin  MRI Brain pending   BEST PRACTICE: VTE px: subq lovenox  SUP px: iv pepcid  Diet: Will start TF's if pt remains intubated in the next 24hrs   -CTA Chest findings and plan of care discussed with ICU Intensivist Dr. Mortimer Fries he agrees with current plan of care  Marda Stalker, [place] Pager [phone] (please enter 7 digits) PCCM Consult Pager [phone] (please enter 7 digits)

## 2019-06-04 NOTE — ED Notes (Signed)
Husband requesting update- MD notified, states she will call back in ten minutes. Husband notified pt will be going to ICU.

## 2019-06-04 NOTE — ED Notes (Signed)
Pt back from CT

## 2019-06-04 NOTE — ED Notes (Signed)
Pt transported to CT with RT and RN (self) at this time.

## 2019-06-04 NOTE — ED Notes (Signed)
Mouth suctioned- clear and bloody sputum noted.

## 2019-06-04 NOTE — ED Notes (Signed)
Pt in-line suction x2- cough noted, pt tolerated well. Mouth suctioned of clear sputum.

## 2019-06-04 NOTE — ED Notes (Signed)
Grey on ice resent to lab.

## 2019-06-04 NOTE — ED Notes (Addendum)
While in patient's room patient's breathing became increasingly shallow, oxygen saturation in the low 80s and dropping down to 79% with good waveform on 6L, Manuela Schwartz PA in room as well, attempted to reposition patient, pt placed on NRB to 15L, XR in room. Pt color changed to gray, patient transported immediately by stretcher with NRB to room 24 and bagged immediately on arrival to room. Dr.Monks in room as well as respiratory. Oxygen saturation improved once bagged.

## 2019-06-04 NOTE — ED Notes (Signed)
Report given to Vanessa, RN.

## 2019-06-04 NOTE — ED Triage Notes (Addendum)
Pt via EMS from home. Pt c/o headache for 2 days on the back of her head. "Feels like someone is hitting me in the back of my head". Pt denies head injury. Pt has a hx of headaches and she states this one does not feel any differently. Pt states she is having light sensitivity. Pt states that she has been around her grandson who tested positive for COVID yesterday. Pt has COPD and wear 3L chronically. Denies SOB or cough  Pt NAD and answers questions appropriately.

## 2019-06-04 NOTE — Progress Notes (Signed)
SUBJECTIVE:  Patient ID: Shelly [name], female    DOB: 27-Jul-1960, 58 y.o.   MRN: [id number] Chief Complaint  Patient presents with  . Follow-up    ultrasound follow up    HPI  Shelly [name] is a 58 y.o. female Patient is seen for evaluation of leg pain and leg swelling. The patient first noticed the swelling remotely. The swelling is associated with pain and discoloration. The pain and swelling worsens with prolonged dependency and improves with elevation. The pain is unrelated to activity.  The patient notes that in the morning the legs are significantly improved but they steadily worsened throughout the course of the day. The patient also notes a steady worsening of the discoloration in the ankle and shin area.    Elevation makes the leg symptoms better, dependency makes them much worse. There is no history of ulcerations. The patient denies any recent changes in medications.  The patient has not been wearing graduated compression.  The patient denies a history of DVT or PE. There is no prior history of phlebitis. There is no history of primary lymphedema.  The patient denies amaurosis fugax or recent TIA symptoms. There are no recent neurological changes noted.  Patient has evidence of reflux located in her right great saphenous vein at the proximal calf level.  No evidence of DVT or superficial venous thrombosis bilaterally.  No evidence of chronic venous insufficiency in the left lower extremity.  Past Medical History:  Diagnosis Date  . Asthma   . CHF (congestive heart failure) ([place])   . COPD (chronic obstructive pulmonary disease) ([place])   . Diabetes mellitus without complication ([place])   . Hypertension   . Sleep apnea     Past Surgical History:  Procedure Laterality Date  . ABDOMINAL HYSTERECTOMY    . COLON SURGERY      Social History   Socioeconomic History  . Marital status: Married    Spouse name: Not on file  . Number of children: Not on file  . Years of  education: Not on file  . Highest education level: Not on file  Occupational History  . Not on file  Tobacco Use  . Smoking status: Former Research scientist (life sciences)  . Smokeless tobacco: Never Used  Substance and Sexual Activity  . Alcohol use: No  . Drug use: No  . Sexual activity: Not on file  Other Topics Concern  . Not on file  Social History Narrative  . Not on file   Social Determinants of Health   Financial Resource Strain:   . Difficulty of Paying Living Expenses: Not on file  Food Insecurity:   . Worried About Charity fundraiser in the Last Year: Not on file  . Ran Out of Food in the Last Year: Not on file  Transportation Needs:   . Lack of Transportation (Medical): Not on file  . Lack of Transportation (Non-Medical): Not on file  Physical Activity:   . Days of Exercise per Week: Not on file  . Minutes of Exercise per Session: Not on file  Stress:   . Feeling of Stress : Not on file  Social Connections:   . Frequency of Communication with Friends and Family: Not on file  . Frequency of Social Gatherings with Friends and Family: Not on file  . Attends Religious Services: Not on file  . Active Member of Clubs or Organizations: Not on file  . Attends Archivist Meetings: Not on file  . Marital Status:  Not on file  Intimate Partner Violence:   . Fear of Current or Ex-Partner: Not on file  . Emotionally Abused: Not on file  . Physically Abused: Not on file  . Sexually Abused: Not on file    Family History  Adopted: Yes  Family history unknown: Yes    No Known Allergies   Review of Systems   Review of Systems: Negative Unless Checked Constitutional: [] Weight loss  [] Fever  [] Chills Cardiac: [] Chest pain   []  Atrial Fibrillation  [] Palpitations   [x] Shortness of breath when laying flat   [x] Shortness of breath with exertion. [] Shortness of breath at rest Vascular:  [] Pain in legs with walking   [] Pain in legs with standing [x] Pain in legs when laying flat    [] Claudication    [] Pain in feet when laying flat    [] History of DVT   [] Phlebitis   [x] Swelling in legs   [] Varicose veins   [] Non-healing ulcers Pulmonary:   [] Uses home oxygen   [] Productive cough   [] Hemoptysis   [] Wheeze  [] COPD   [] Asthma Neurologic:  [] Dizziness   [] Seizures  [] Blackouts [] History of stroke   [] History of TIA  [] Aphasia   [] Temporary Blindness   [] Weakness or numbness in arm   [] Weakness or numbness in leg Musculoskeletal:   [] Joint swelling   [x] Joint pain   [] Low back pain  []  History of Knee Replacement [x] Arthritis [] back Surgeries  []  Spinal Stenosis    Hematologic:  [] Easy bruising  [] Easy bleeding   [] Hypercoagulable state   [] Anemic Gastrointestinal:  [] Diarrhea   [] Vomiting  [x] Gastroesophageal reflux/heartburn   [] Difficulty swallowing. [] Abdominal pain Genitourinary:  [] Chronic kidney disease   [] Difficult urination  [] Anuric   [] Blood in urine [] Frequent urination  [] Burning with urination   [] Hematuria Skin:  [] Rashes   [] Ulcers [] Wounds Psychological:  [] History of anxiety   []  History of major depression  []  Memory Difficulties      OBJECTIVE:   Physical Exam  BP 133/85 (BP Location: Right Arm)   Pulse 86   Resp 17   Wt 205 lb (93 kg)   BMI 34.11 kg/m   Gen: WD/WN, NAD Head: [place]/AT, No temporalis wasting.  Ear/Nose/Throat: Hearing grossly intact, nares w/o erythema or drainage Eyes: PER, EOMI, sclera nonicteric.  Neck: Supple, no masses.  No JVD.  Pulmonary:  Good air movement, no use of accessory muscles.  Cardiac: RRR Vascular: 2+ bilateral edema Vessel Right Left  Radial Palpable Palpable  Dorsalis Pedis Palpable Palpable  Posterior Tibial Not Palpable Not Palpable   Gastrointestinal: soft, non-distended. No guarding/no peritoneal signs.  Musculoskeletal: M/S 5/5 throughout.  No deformity or atrophy.  Neurologic: Pain and light touch intact in extremities.  Symmetrical.  Speech is fluent. Motor exam as listed above. Psychiatric: Judgment  intact, Mood & affect appropriate for pt's clinical situation. Dermatologic: No Venous rashes. No Ulcers Noted.  No changes consistent with cellulitis. Lymph : No Cervical lymphadenopathy, no lichenification or skin changes of chronic lymphedema.       ASSESSMENT AND PLAN:  1. Lymphedema No surgery or intervention at this point in time.    I have had a long discussion with the patient regarding venous insufficiency and why it  causes symptoms, specifically venous ulceration . I have discussed with the patient the chronic skin changes that accompany venous insufficiency and the long term sequela such as infection and recurring  ulceration.  Patient will be placed in [employer] which will be changed weekly drainage permitting.  In  addition, behavioral modification including several periods of elevation of the lower extremities during the day will be continued. Achieving a position with the ankles at heart level was stressed to the patient  The patient is instructed to begin routine exercise, especially walking on a daily basis   Following the review of the ultrasound the patient will follow up in one week to reassess the degree of swelling and the control that Unna therapy is offering.   The patient can be assessed for graduated compression stockings or wraps as well as a Lymph Pump once the swelling is better controlled .   2. Primary osteoarthritis of first carpometacarpal joint of left hand Continue NSAID medications as already ordered, these medications have been reviewed and there are no changes at this time.  Continued activity and therapy was stressed.   3. Type 2 diabetes mellitus without complication, unspecified whether long term insulin use ([place]) Continue hypoglycemic medications as already ordered, these medications have been reviewed and there are no changes at this time.  Hgb A1C to be monitored as already arranged by primary service    Current Outpatient Medications on  File Prior to Visit  Medication Sig Dispense Refill  . albuterol (VENTOLIN HFA) 108 (90 Base) MCG/ACT inhaler Inhale 2 puffs into the lungs every 6 (six) hours as needed for wheezing or shortness of breath. 1 g 0  . azithromycin (ZITHROMAX) 250 MG tablet Use as directed 6 tablet 0  . baclofen (LIORESAL) 10 MG tablet Take 10 mg by mouth 3 (three) times daily.    . busPIRone (BUSPAR) 10 MG tablet Take 10 mg by mouth 2 (two) times daily.    . cetirizine (ZYRTEC) 10 MG tablet cetirizine 10 mg tablet  take 1 tablet by mouth once daily    . chlorpheniramine-HYDROcodone (TUSSIONEX PENNKINETIC ER) 10-8 MG/5ML SUER Take 5 mLs by mouth at bedtime as needed for cough. 70 mL 0  . clonazePAM (KLONOPIN) 0.5 MG tablet Take 1 tablet (0.5 mg total) by mouth 2 (two) times daily. 60 tablet 0  . DULoxetine (CYMBALTA) 30 MG capsule Take 1 capsule (30 mg total) by mouth 2 (two) times daily. 60 capsule 0  . escitalopram (LEXAPRO) 10 MG tablet Take 10 mg by mouth daily.    . fluticasone furoate-vilanterol (BREO ELLIPTA) 200-25 MCG/INH AEPB Inhale 1 puff into the lungs daily. 1 each 0  . Fluticasone-Salmeterol (ADVAIR DISKUS) 250-50 MCG/DOSE AEPB Inhale 1 puff into the lungs 2 (two) times daily. 1 each 0  . furosemide (LASIX) 40 MG tablet Take 40 mg by mouth 2 (two) times daily.    Marland Kitchen ipratropium-albuterol (DUONEB) 0.5-2.5 (3) MG/3ML SOLN Inhale 3 mLs into the lungs 4 (four) times daily.    Marland Kitchen lisinopril (ZESTRIL) 20 MG tablet Take 20 mg by mouth daily.    . naproxen (NAPROSYN) 500 MG tablet Take 500 mg by mouth 2 (two) times daily.    . predniSONE (DELTASONE) 10 MG tablet Take one tab by mouth daily. 5 tablet 0   No current facility-administered medications on file prior to visit.    There are no Patient Instructions on file for this visit. No follow-ups on file.   Kris Hartmann, NP  This note was completed with Sales executive.  Any errors are purely unintentional.

## 2019-06-04 NOTE — ED Notes (Signed)
Pt moved from c-pod. RN states pt became unresponsive and apneic suddenly. Pt bagged on arrival, central pulse present.

## 2019-06-04 NOTE — ED Notes (Signed)
X-ray at bedside

## 2019-06-05 ENCOUNTER — Inpatient Hospital Stay: Payer: Medicare Other

## 2019-06-05 LAB — GLUCOSE, CAPILLARY
Glucose-Capillary: 122 mg/dL — ABNORMAL HIGH (ref 70–99)
Glucose-Capillary: 134 mg/dL — ABNORMAL HIGH (ref 70–99)
Glucose-Capillary: 154 mg/dL — ABNORMAL HIGH (ref 70–99)
Glucose-Capillary: 160 mg/dL — ABNORMAL HIGH (ref 70–99)
Glucose-Capillary: 178 mg/dL — ABNORMAL HIGH (ref 70–99)
Glucose-Capillary: 189 mg/dL — ABNORMAL HIGH (ref 70–99)
Glucose-Capillary: 193 mg/dL — ABNORMAL HIGH (ref 70–99)

## 2019-06-05 LAB — BLOOD GAS, ARTERIAL
Acid-Base Excess: 10.1 mmol/L — ABNORMAL HIGH (ref 0.0–2.0)
Bicarbonate: 37.5 mmol/L — ABNORMAL HIGH (ref 20.0–28.0)
FIO2: 0.45
MECHVT: 450 mL
Mechanical Rate: 20
O2 Saturation: 93.6 %
PEEP: 5 cmH2O
Patient temperature: 37
pCO2 arterial: 62 mmHg — ABNORMAL HIGH (ref 32.0–48.0)
pH, Arterial: 7.39 (ref 7.350–7.450)
pO2, Arterial: 70 mmHg — ABNORMAL LOW (ref 83.0–108.0)

## 2019-06-05 LAB — COMPREHENSIVE METABOLIC PANEL
ALT: 33 U/L (ref 0–44)
AST: 34 U/L (ref 15–41)
Albumin: 3.4 g/dL — ABNORMAL LOW (ref 3.5–5.0)
Alkaline Phosphatase: 53 U/L (ref 38–126)
Anion gap: 18 — ABNORMAL HIGH (ref 5–15)
BUN: 15 mg/dL (ref 6–20)
CO2: 27 mmol/L (ref 22–32)
Calcium: 8.4 mg/dL — ABNORMAL LOW (ref 8.9–10.3)
Chloride: 97 mmol/L — ABNORMAL LOW (ref 98–111)
Creatinine, Ser: 0.8 mg/dL (ref 0.44–1.00)
GFR calc Af Amer: >60 mL/min (ref >60)
GFR calc non Af Amer: >60 mL/min (ref >60)
Glucose, Bld: 188 mg/dL — ABNORMAL HIGH (ref 70–99)
Potassium: 4.2 mmol/L (ref 3.5–5.1)
Sodium: 142 mmol/L (ref 135–145)
Total Bilirubin: 0.6 mg/dL (ref 0.3–1.2)
Total Protein: 6.4 g/dL — ABNORMAL LOW (ref 6.5–8.1)

## 2019-06-05 LAB — URINE CULTURE: Culture: NO GROWTH

## 2019-06-05 LAB — URINE DRUG SCREEN, QUALITATIVE (ARMC ONLY)
Amphetamines, Ur Screen: NOT DETECTED
Barbiturates, Ur Screen: NOT DETECTED
Benzodiazepine, Ur Scrn: NOT DETECTED
Cannabinoid 50 Ng, Ur ~~LOC~~: POSITIVE — AB
Cocaine Metabolite,Ur ~~LOC~~: NOT DETECTED
MDMA (Ecstasy)Ur Screen: NOT DETECTED
Methadone Scn, Ur: NOT DETECTED
Opiate, Ur Screen: POSITIVE — AB
Phencyclidine (PCP) Ur S: NOT DETECTED
Tricyclic, Ur Screen: POSITIVE — AB

## 2019-06-05 LAB — CBC
HCT: 48.8 % — ABNORMAL HIGH (ref 36.0–46.0)
Hemoglobin: 15.3 g/dL — ABNORMAL HIGH (ref 12.0–15.0)
MCH: 29.3 pg (ref 26.0–34.0)
MCHC: 31.4 g/dL (ref 30.0–36.0)
MCV: 93.3 fL (ref 80.0–100.0)
Platelets: 197 10*3/uL (ref 150–400)
RBC: 5.23 MIL/uL — ABNORMAL HIGH (ref 3.87–5.11)
RDW: 16.3 % — ABNORMAL HIGH (ref 11.5–15.5)
WBC: 7.4 10*3/uL (ref 4.0–10.5)
nRBC: 0.3 % — ABNORMAL HIGH (ref 0.0–0.2)

## 2019-06-05 LAB — HEMOGLOBIN A1C
Hgb A1c MFr Bld: 6.8 % — ABNORMAL HIGH (ref 4.8–5.6)
Mean Plasma Glucose: 148.46 mg/dL

## 2019-06-05 LAB — C-REACTIVE PROTEIN: CRP: 0.5 mg/dL (ref <1.0)

## 2019-06-05 LAB — FERRITIN: Ferritin: 52 ng/mL (ref 11–307)

## 2019-06-05 LAB — PHOSPHORUS: Phosphorus: 2.5 mg/dL (ref 2.5–4.6)

## 2019-06-05 LAB — PROCALCITONIN: Procalcitonin: 0.12 ng/mL

## 2019-06-05 LAB — MRSA PCR SCREENING: MRSA by PCR: NEGATIVE

## 2019-06-05 LAB — ETHANOL: Alcohol, Ethyl (B): <10 mg/dL (ref <10)

## 2019-06-05 LAB — PATHOLOGIST SMEAR REVIEW

## 2019-06-05 LAB — TROPONIN I (HIGH SENSITIVITY): Troponin I (High Sensitivity): 33 ng/L — ABNORMAL HIGH (ref <18)

## 2019-06-05 LAB — LACTATE DEHYDROGENASE: LDH: 152 U/L (ref 98–192)

## 2019-06-05 LAB — MAGNESIUM: Magnesium: 2.6 mg/dL — ABNORMAL HIGH (ref 1.7–2.4)

## 2019-06-05 MED ORDER — ORAL CARE MOUTH RINSE
15.0000 mL | OROMUCOSAL | Status: DC
  Administered 2019-06-05 (×5): 15 mL via OROMUCOSAL

## 2019-06-05 MED ORDER — ORAL CARE MOUTH RINSE
15.0000 mL | Freq: Two times a day (BID) | OROMUCOSAL | Status: DC
  Administered 2019-06-05 – 2019-06-08 (×5): 15 mL via OROMUCOSAL

## 2019-06-05 MED ORDER — CLONAZEPAM 0.5 MG PO TABS
0.5000 mg | ORAL_TABLET | Freq: Two times a day (BID) | ORAL | Status: DC
  Administered 2019-06-05 – 2019-06-08 (×6): 0.5 mg via ORAL
  Filled 2019-06-05 (×8): qty 1

## 2019-06-05 MED ORDER — CLONAZEPAM 0.5 MG PO TABS
0.2500 mg | ORAL_TABLET | Freq: Once | ORAL | Status: AC
  Administered 2019-06-05: 0.25 mg via ORAL

## 2019-06-05 MED ORDER — CHLORHEXIDINE GLUCONATE CLOTH 2 % EX PADS
6.0000 | MEDICATED_PAD | Freq: Every day | CUTANEOUS | Status: DC
  Administered 2019-06-05 – 2019-06-06 (×2): 6 via TOPICAL

## 2019-06-05 MED ORDER — CHLORHEXIDINE GLUCONATE 0.12% ORAL RINSE (MEDLINE KIT)
15.0000 mL | Freq: Two times a day (BID) | OROMUCOSAL | Status: DC
  Administered 2019-06-05: 15 mL via OROMUCOSAL

## 2019-06-05 MED ORDER — ASPIRIN 81 MG PO CHEW
81.0000 mg | CHEWABLE_TABLET | Freq: Every day | ORAL | Status: DC
  Administered 2019-06-06 – 2019-06-07 (×2): 81 mg
  Filled 2019-06-05 (×2): qty 1

## 2019-06-05 MED ORDER — ASPIRIN 300 MG RE SUPP
300.0000 mg | Freq: Once | RECTAL | Status: AC
  Administered 2019-06-05: 300 mg via RECTAL
  Filled 2019-06-05: qty 1

## 2019-06-05 NOTE — Progress Notes (Signed)
PHARMACY - PHYSICIAN COMMUNICATION CRITICAL VALUE ALERT - BLOOD CULTURE IDENTIFICATION (BCID)  Shelly [name] is an 58 y.o. female who presented to [facility] on 06/04/2019  Assessment:  1/4 bottles GPC (possible contaminant)  Name of physician (or Provider) Contacted: Dr. Lanney Gins  Current antibiotics: Unasyn  Changes to prescribed antibiotics recommended: none, await further identification  No results found for this or any previous visit.  Tawnya Crook, PharmD 06/05/2019  11:48 AM

## 2019-06-05 NOTE — Progress Notes (Signed)
Name: Shelly [name] MRN: [id number] DOB: 1960-07-29    ADMISSION DATE:  06/04/2019 CONSULTATION DATE: 12/27  REFERRING MD : Dr. Royden Purl   CHIEF COMPLAINT: Headache   BRIEF PATIENT DESCRIPTION: 58 yo female admitted with headaches, acute encephalopathy, and acute on chronic hypoxic hypercapnic respiratory failure secondary to possible air embolism due to CTA Chest findings of small amount of gas in the right atrium and central pulmonary artery as well as in the superficial veins of the upper chest, finding likely iatrogenic and related to intravenous access and aspiration requiring mechanical intubation   SIGNIFICANT EVENT/STUDIES:  12/27: Pt admitted to ICU with acute on chronic hypoxic hypercapnic respiratory failure requiring mechanical intubation  12/27: CT Head revealed no acute intracranial abnormality. 12/27: CTA Chest revealed negative for acute pulmonary embolus. Small amount of gas in the right atrium and central pulmonary artery as well as in the superficial veins of the upper chest. Finding is likely iatrogenic and related to intravenous access. Consolidated lung in the left base with volume loss, much of which is likely atelectatic however some mixed hypoattenuation of the consolidative lung could suggest underlying infection or aspiration particularly given a patulous, fluid-filled esophagus despite NG placement. Stable bilateral adrenal adenomas. Trace amount of perihepatic ascites, new from prior. Aortic Atherosclerosis 12/28- successfully weaned and liberated from mechanical ventilation  MICRO DATA: COVID-19 12/27>>negative  Influenza PCR 12/27>>negative  Blood x2 12/27>> Urine 12/27>>  ABX: Azithromycin x1 dose 12/27 Ceftriaxone x1 dose 12/27 Unasyn 12/27>>  HISTORY OF PRESENT ILLNESS:   This is a 58 yo female with a PMH of OSA, HTN, COPD, Headaches, Chronic Home O2 @2 -3LType II Diabetes Mellitus, Asthma, Former Smoker, Atypical Chest Pain, Morbid Obesity, and CHF  with preserved LV function (Echo 02/2019 EF 60 to 65%).  She presented to [facility] ER via EMS on 12/27 with c/o headache onset 2 days prior to presentation.  Per ER notes EMS reported pt lethargic and "slow" to answer questions upon their arrival at her home. She wears chronic home O2, and on 3L her O2 sats were 91%.  There was also concern regarding COVID-19 exposure because her grandson tested positive, and she had been around him on Christmas.  Per ER notes on arrival pt initially alert, oriented, and O2 sats were 97% on 3L. She received 500 ml NS bolus, iv zofran, 30 mg iv toradol, and 25 mg iv benadryl.  However, she later became hypoxic with O2 sats 82% on 3L and unresponsive with snoring respirations requiring mechanical intubation.  Lab results revealed K+ 5.4, glucose 162, BNP 2,488, troponin 34, wbc 16.0, and UA negative for UTI.  Post intubation ABG resulted: pH 7.38/pCO2 61/pO2 213/acid-base excess 8.7/bicarb 36.1.  Influenza PCR and COVID-19 negative.  CXR negative for acute abnormalities, but revealed bibasilar scarring.  She received ceftriaxone, azithromycin, and duoneb x1 dose.  CT Head negative. CTA Chest negative for pulmonary embolism, however revealed small amount of gas in the right atrium and central pulmonary artery as well as in the superficial veins of the upper chest, findings likely iatrogenic and related to intravenous access concerning for possible air embolism.  She was subsequently admitted to ICU for additional workup and treatment.    PAST MEDICAL HISTORY :   has a past medical history of Asthma, CHF (congestive heart failure) ([place]), COPD (chronic obstructive pulmonary disease) ([place]), Diabetes mellitus without complication ([place]), Hypertension, and Sleep apnea.  has a past surgical history that includes Colon surgery and Abdominal hysterectomy. Prior to Admission medications  Medication Sig Start Date End Date Taking? Authorizing Provider  albuterol (VENTOLIN HFA) 108 (90 Base)  MCG/ACT inhaler Inhale 2 puffs into the lungs every 6 (six) hours as needed for wheezing or shortness of breath. 03/06/19   Fritzi Mandes, MD  azithromycin [facility]) 250 MG tablet Use as directed 04/26/19   Kendell Bane, NP  baclofen (LIORESAL) 10 MG tablet Take 10 mg by mouth 3 (three) times daily. 01/13/19   [provider]  busPIRone (BUSPAR) 10 MG tablet Take 10 mg by mouth 2 (two) times daily. 04/14/19   [provider]  cetirizine (ZYRTEC) 10 MG tablet cetirizine 10 mg tablet  take 1 tablet by mouth once daily    [provider]  chlorpheniramine-HYDROcodone (TUSSIONEX PENNKINETIC ER) 10-8 MG/5ML SUER Take 5 mLs by mouth at bedtime as needed for cough. 05/01/19   Kendell Bane, NP  clonazePAM (KLONOPIN) 0.5 MG tablet Take 1 tablet (0.5 mg total) by mouth 2 (two) times daily. 04/26/19   Kendell Bane, NP  DULoxetine (CYMBALTA) 30 MG capsule Take 1 capsule (30 mg total) by mouth 2 (two) times daily. 03/03/19   Max Sane, MD  escitalopram (LEXAPRO) 10 MG tablet Take 10 mg by mouth daily. 04/14/19   [provider]  fluticasone furoate-vilanterol (BREO ELLIPTA) 200-25 MCG/INH AEPB Inhale 1 puff into the lungs daily. 09/09/16   Fritzi Mandes, MD  Fluticasone-Salmeterol (ADVAIR DISKUS) 250-50 MCG/DOSE AEPB Inhale 1 puff into the lungs 2 (two) times daily. 03/06/19 03/05/20  Fritzi Mandes, MD  furosemide (LASIX) 40 MG tablet Take 40 mg by mouth 2 (two) times daily. 04/05/19   [provider]  ipratropium-albuterol (DUONEB) 0.5-2.5 (3) MG/3ML SOLN Inhale 3 mLs into the lungs 4 (four) times daily. 03/20/19 03/14/20  [provider]  lisinopril (ZESTRIL) 20 MG tablet Take 20 mg by mouth daily. 01/04/19   [provider]  naproxen (NAPROSYN) 500 MG tablet Take 500 mg by mouth 2 (two) times daily. 03/21/19   [provider]  predniSONE (DELTASONE) 10 MG tablet Take one tab by mouth daily. 05/01/19   Kendell Bane, NP   No Known  Allergies  FAMILY HISTORY:  She was adopted. Family history is unknown by patient. SOCIAL HISTORY:  reports that she has quit smoking. She has never used smokeless tobacco. She reports that she does not drink alcohol or use drugs.  REVIEW OF SYSTEMS:   Unable to assess pt mechanically intubated   SUBJECTIVE:  Unable to assess pt mechanically intubated  VITAL SIGNS: Temp:  [94.6 F (34.8 C)-99.9 F (37.7 C)] 98.1 F (36.7 C) (12/28 0800) Pulse Rate:  [76-117] 91 (12/28 1000) Resp:  [12-23] 20 (12/28 1000) BP: (90-157)/(56-118) 90/56 (12/28 1000) SpO2:  [82 %-100 %] 91 % (12/28 1000) FiO2 (%):  [40 %-70 %] 45 % (12/28 1000) Weight:  [93 kg-94.8 kg] 94.8 kg (12/28 0200)  PHYSICAL EXAMINATION: General: acutely ill appearing female, NAD mechanically intubated  Neuro: sedated, not following commands, PERRL  HEENT: bilateral sclera errythemia, supple, no JVD  Cardiovascular: nsr, rrr, no R/G  Lungs: diminished throughout, even , non labored  Abdomen: +BS x4, soft, non distended, non tender  Musculoskeletal: normal bulk and tone, no edema  Skin: intact no rashes or lesions present   Recent Labs  Lab 06/04/19 1910 06/05/19 0452  NA 139 142  K 5.4* 4.2  CL 94* 97*  CO2 36* 27  BUN 16 15  CREATININE 0.82 0.80  GLUCOSE 162* 188*  Recent Labs  Lab 06/04/19 1910 06/05/19 0452  HGB 16.8* 15.3*  HCT 54.5* 48.8*  WBC 16.0* 7.4  PLT 217 197   DG Abd 1 View  Result Date: 06/05/2019 CLINICAL DATA:  OG tube placement EXAM: ABDOMEN - 1 VIEW COMPARISON:  Radiograph 05/01/2011, CT a chest 05/05/2019 FINDINGS: A transesophageal tube tip and side port terminate in the region of the gastric body, beyond the GE junction. Pacer pads overlie the chest. Additional support devices including catheter tubing and telemetry leads overlie the upper abdomen. Bowel gas pattern is unremarkable. Persisting consolidation partially obscuring the left hemidiaphragm. Additional atelectatic changes in  the right lung base. IMPRESSION: 1. A transesophageal tube tip and side port terminate in the region of the gastric body, beyond the GE junction. 2. Persisting consolidation partially obscuring the left hemidiaphragm. Electronically Signed   By: Lovena Le M.D.   On: 06/05/2019 00:40   CT Head Wo Contrast  Result Date: 06/04/2019 CLINICAL DATA:  Acute hypoxic respiratory failure, altered mental status EXAM: CT HEAD WITHOUT CONTRAST TECHNIQUE: Contiguous axial images were obtained from the base of the skull through the vertex without intravenous contrast. COMPARISON:  CT head 02/27/2019 FINDINGS: Brain: No evidence of acute infarction, hemorrhage, hydrocephalus, extra-axial collection or mass lesion/mass effect. Vascular: Atherosclerotic calcification of the carotid siphons. No hyperdense vessel. Skull: No calvarial fracture or suspicious osseous lesion. No scalp swelling or hematoma. Sinuses/Orbits: Paranasal sinuses and mastoid air cells are predominantly clear. Included orbital structures are unremarkable. Other: None IMPRESSION: No acute intracranial abnormality. Electronically Signed   By: Lovena Le M.D.   On: 06/04/2019 22:59   CT Angio Chest PE W/Cm &/Or Wo Cm  Result Date: 06/04/2019 CLINICAL DATA:  Acute hypoxic respiratory failure, negative COVID-19 testing EXAM: CT ANGIOGRAPHY CHEST WITH CONTRAST TECHNIQUE: Multidetector CT imaging of the chest was performed using the standard protocol during bolus administration of intravenous contrast. Multiplanar CT image reconstructions and MIPs were obtained to evaluate the vascular anatomy. CONTRAST:  15mL OMNIPAQUE IOHEXOL 350 MG/ML SOLN COMPARISON:  Chest CTA 04/20/2019 FINDINGS: Cardiovascular: Satisfactory opacification the pulmonary arteries to the segmental level. No pulmonary artery filling defects are identified. Central pulmonary arteries remain normal caliber. Small amount of gas in the right atrium and central pulmonary artery as well as in  the superficial veins of the upper chest. Finding is likely iatrogenic and related to intravenous access. Cardiac size at the upper limits of normal. No pericardial effusion. Coronary artery calcifications are minimal. Atherosclerotic plaque within the normal caliber aorta. Normal 3 vessel branching of the aortic arch. Minimal calcifications in the proximal great vessels. Mediastinum/Nodes: Thyroid and thoracic inlet is unremarkable. No mediastinal, hilar or axillary adenopathy. The thoracic esophagus is patulous and fluid-filled. A transesophageal tube terminates below the level of imaging with the side port beyond the GE junction. Patient is intubated at this time with the endotracheal tip approximately 3.4 cm from the carina. Lungs/Pleura: There are several bandlike areas of opacity similar to comparison likely reflective of scarring/architectural distortion. Some dependent consolidated lung in the left base with volume loss, much of which is likely atelectatic however some mixed hypoattenuation of the consolidative lung could suggest underlying infection or aspiration. Upper Abdomen: Trace amount of perihepatic ascites. Stable appearance of the bilateral adrenal adenomas. Musculoskeletal: No acute osseous abnormality or suspicious osseous lesion. Mild exaggerated thoracic kyphosis with multilevel discogenic change. Review of the MIP images confirms the above findings. IMPRESSION: 1. Negative for acute pulmonary embolus. 2. Small amount of gas in  the right atrium and central pulmonary artery as well as in the superficial veins of the upper chest. Finding is likely iatrogenic and related to intravenous access. 3. Consolidated lung in the left base with volume loss, much of which is likely atelectatic however some mixed hypoattenuation of the consolidative lung could suggest underlying infection or aspiration particularly given a patulous, fluid-filled esophagus despite NG placement. 4. Stable bilateral adrenal  adenomas. 5. Trace amount of perihepatic ascites, new from prior. 6. Aortic Atherosclerosis (ICD10-I70.0). Electronically Signed   By: Lovena Le M.D.   On: 06/04/2019 22:57   DG Chest Portable 1 View  Result Date: 06/04/2019 CLINICAL DATA:  Headache, respiratory failure, became unresponsive, family positive for COVID-19; past history asthma, CHF, COPD, diabetes mellitus, hypertension EXAM: PORTABLE CHEST 1 VIEW COMPARISON:  Portable exam 1903 hours compared to 04/20/2019 FINDINGS: Tip of endotracheal tube projects 5.6 cm above carina. External pacing leads project over chest. Normal heart size and mediastinal contours. Atherosclerotic calcification aorta. Chronic accentuation of bibasilar markings. No definite acute infiltrate, pleural effusion or pneumothorax. IMPRESSION: Bibasilar scarring without acute abnormalities. Electronically Signed   By: Lavonia Dana M.D.   On: 06/04/2019 19:32      ASSESSMENT / PLAN:  Acute on chronic hypercapnic hypoxic respiratory failure secondary to possible air embolism based on CTA Chest findings as described above and aspiration  Mechanical intubation  Hx: OSA and Asthma -Extubate today Scheduled and prn bronchodilator therapy  -upgrade BPH for LLL atelectasis -procal 0.11 and lactate 1.8 on admission   Hypertension-controlled Mildly elevated troponin likely demand ischemia in setting of acute on chronic respiratory failure  Hx: HLD and Chronic CHF with preserved LV function  Continuous telemetry monitoring  Will hold outpatient antihypertensives for now  Trend troponin's   Hyperkalemia etiology unknown  Trend BMP  Monitor for EKG changes Monitor UOP   Leukocytosis possibly secondary to aspiration pneumonia  Trend WBC and monitor fever curve  Trend PCT Follow cultures  Will start unasyn   Type II Diabetes Mellitus  CBG's q4hrs  SSI   Acute encephalopathy possibly secondary to hypoxia concerning for possible ischemic CVA  Mechanical  intubation pain/discomfort  Acute pain secondary to headaches (CT Head 12/27 negative for acute abnormality.  Pt has a hx of headaches)  Maintain RASS goal -1 to -2  Propofol gtt and prn fentanyl to maintain RASS goal and for pain management  WUA daily Urine drug screen and alcohol level pending  Daily aspirin  MRI Brain pending   BEST PRACTICE: VTE px: subq lovenox  SUP px: iv pepcid  Diet: Will start TF's if pt remains intubated in the next 24hrs  .  Critical care provider statement:    Critical care time (minutes):  32   Critical care time was exclusive of:  Separately billable procedures and  treating other patients   Critical care was necessary to treat or prevent imminent or  life-threatening deterioration of the following conditions:  acute hypoxemic respiratory failure, advanced copd, multiple comorbid conditions.    Critical care was time spent personally by me on the following  activities:  Development of treatment plan with patient or surrogate,  discussions with consultants, evaluation of patient's response to  treatment, examination of patient, obtaining history from patient or  surrogate, ordering and performing treatments and interventions, ordering  and review of laboratory studies and re-evaluation of patient's condition   I assumed direction of critical care for this patient from another  provider in my specialty: no  Ottie Glazier, M.D.  [facility] [place]

## 2019-06-05 NOTE — ED Notes (Signed)
Report provided to Sarah, RN

## 2019-06-05 NOTE — Progress Notes (Signed)
Pt was suctioned prior to extubation for a small amount of thick white secretions. Per Dr. Teodoro Kil order, she was extubated. No stridor was heard and she is voicing. I placed her on a bubble HFNC on 10 L 02 to keep sa02 at 90%.

## 2019-06-05 NOTE — Progress Notes (Signed)
Pharmacy Antibiotic Note  Shelly [name] is a 58 y.o. female admitted on 06/04/2019 with pneumonia.  Pharmacy has been consulted for Unasyn dosing.  Plan: Unasyn 3gm IV q6hrs  Height: 5\' 5"  (165.1 cm) Weight: 205 lb (93 kg) IBW/kg (Calculated) : 57  Temp (24hrs), Avg:98.9 F (37.2 C), Min:94.6 F (34.8 C), Max:99.9 F (37.7 C)  Recent Labs  Lab 06/04/19 1910 06/04/19 2110  WBC 16.0*  --   CREATININE 0.82  --   LATICACIDVEN  --  1.8    Estimated Creatinine Clearance: 86.3 mL/min (by C-G formula based on SCr of 0.82 mg/dL).    No Known Allergies  Antimicrobials this admission: Zithromax 12/27 x 1 Rocephin 12/27 x 1 Unasyn 12/28 >>  Dose adjustments this admission:   Microbiology results:  BCx:   UCx:    Sputum:    MRSA PCR:   Thank you for allowing pharmacy to be a part of this patient's care.  Hart Robinsons A 06/05/2019 12:30 AM

## 2019-06-05 NOTE — Progress Notes (Signed)
eLink Physician-Brief Progress Note Patient Name: Shelly [name] DOB: 02/09/1961 MRN: [id number]   Date of Service  06/05/2019  HPI/Events of Note  58 year old woman with COPD, here with confusion, possible pneumonia, air embolism on CT chest. Case already admitted by bedside team and bedside intensivist aware of CT findings. No interventions requested from [place]. Stable vitals on 40%/peep 5 at this time.   eICU Interventions  Please call if needed     Intervention Category Major Interventions: Respiratory failure - evaluation and management Evaluation Type: Other  Margaretmary Lombard 06/05/2019, 12:59 AM

## 2019-06-05 NOTE — Progress Notes (Signed)
Cpt not performed, pt sitting on the side of the bed.

## 2019-06-06 LAB — CBC WITH DIFFERENTIAL/PLATELET
Abs Immature Granulocytes: 0.03 10*3/uL (ref 0.00–0.07)
Basophils Absolute: 0 10*3/uL (ref 0.0–0.1)
Basophils Relative: 0 %
Eosinophils Absolute: 0 10*3/uL (ref 0.0–0.5)
Eosinophils Relative: 0 %
HCT: 46.9 % — ABNORMAL HIGH (ref 36.0–46.0)
Hemoglobin: 14.4 g/dL (ref 12.0–15.0)
Immature Granulocytes: 0 %
Lymphocytes Relative: 7 %
Lymphs Abs: 0.7 10*3/uL (ref 0.7–4.0)
MCH: 29.4 pg (ref 26.0–34.0)
MCHC: 30.7 g/dL (ref 30.0–36.0)
MCV: 95.7 fL (ref 80.0–100.0)
Monocytes Absolute: 0.4 10*3/uL (ref 0.1–1.0)
Monocytes Relative: 4 %
Neutro Abs: 9.1 10*3/uL — ABNORMAL HIGH (ref 1.7–7.7)
Neutrophils Relative %: 89 %
Platelets: 219 10*3/uL (ref 150–400)
RBC: 4.9 MIL/uL (ref 3.87–5.11)
RDW: 16.9 % — ABNORMAL HIGH (ref 11.5–15.5)
WBC: 10.2 10*3/uL (ref 4.0–10.5)
nRBC: 0 % (ref 0.0–0.2)

## 2019-06-06 LAB — BASIC METABOLIC PANEL
Anion gap: 13 (ref 5–15)
BUN: 21 mg/dL — ABNORMAL HIGH (ref 6–20)
CO2: 32 mmol/L (ref 22–32)
Calcium: 8.8 mg/dL — ABNORMAL LOW (ref 8.9–10.3)
Chloride: 94 mmol/L — ABNORMAL LOW (ref 98–111)
Creatinine, Ser: 0.73 mg/dL (ref 0.44–1.00)
GFR calc Af Amer: >60 mL/min (ref >60)
GFR calc non Af Amer: >60 mL/min (ref >60)
Glucose, Bld: 158 mg/dL — ABNORMAL HIGH (ref 70–99)
Potassium: 4.5 mmol/L (ref 3.5–5.1)
Sodium: 139 mmol/L (ref 135–145)

## 2019-06-06 LAB — GLUCOSE, CAPILLARY
Glucose-Capillary: 127 mg/dL — ABNORMAL HIGH (ref 70–99)
Glucose-Capillary: 141 mg/dL — ABNORMAL HIGH (ref 70–99)
Glucose-Capillary: 151 mg/dL — ABNORMAL HIGH (ref 70–99)
Glucose-Capillary: 166 mg/dL — ABNORMAL HIGH (ref 70–99)
Glucose-Capillary: 184 mg/dL — ABNORMAL HIGH (ref 70–99)
Glucose-Capillary: 203 mg/dL — ABNORMAL HIGH (ref 70–99)

## 2019-06-06 LAB — PROCALCITONIN: Procalcitonin: <0.1 ng/mL

## 2019-06-06 MED ORDER — FAMOTIDINE 20 MG PO TABS
20.0000 mg | ORAL_TABLET | Freq: Two times a day (BID) | ORAL | Status: DC
  Administered 2019-06-06 – 2019-06-08 (×4): 20 mg via ORAL
  Filled 2019-06-06 (×4): qty 1

## 2019-06-06 MED ORDER — PREDNISONE 50 MG PO TABS
50.0000 mg | ORAL_TABLET | Freq: Every day | ORAL | Status: DC
  Administered 2019-06-07 – 2019-06-08 (×2): 50 mg via ORAL
  Filled 2019-06-06 (×2): qty 1

## 2019-06-06 NOTE — Progress Notes (Signed)
Patient ID: Shelly [name], female   DOB: 1960-11-26, 58 y.o.   MRN: [id number] Pt accepted to the hospitalist team from ICU, will pickup in am

## 2019-06-06 NOTE — Progress Notes (Signed)
Name: Shelly [name] MRN: [id number] DOB: Aug 03, 1960    ADMISSION DATE:  06/04/2019 CONSULTATION DATE: 12/27  REFERRING MD : Dr. Royden Purl   CHIEF COMPLAINT: Headache   BRIEF PATIENT DESCRIPTION: 58 yo female admitted with headaches, acute encephalopathy, and acute on chronic hypoxic hypercapnic respiratory failure secondary to possible air embolism due to CTA Chest findings of small amount of gas in the right atrium and central pulmonary artery as well as in the superficial veins of the upper chest, finding likely iatrogenic and related to intravenous access and aspiration requiring mechanical intubation   SIGNIFICANT EVENT/STUDIES:  12/27: Pt admitted to ICU with acute on chronic hypoxic hypercapnic respiratory failure requiring mechanical intubation  12/27: CT Head revealed no acute intracranial abnormality. 12/27: CTA Chest revealed negative for acute pulmonary embolus. Small amount of gas in the right atrium and central pulmonary artery as well as in the superficial veins of the upper chest. Finding is likely iatrogenic and related to intravenous access. Consolidated lung in the left base with volume loss, much of which is likely atelectatic however some mixed hypoattenuation of the consolidative lung could suggest underlying infection or aspiration particularly given a patulous, fluid-filled esophagus despite NG placement. Stable bilateral adrenal adenomas. Trace amount of perihepatic ascites, new from prior. Aortic Atherosclerosis 12/28- successfully weaned and liberated from mechanical ventilation 12/29 - patient had been slow to improve but did ok without need of NIV overnight. Plan to optimize medically and downgrade from MICU  MICRO DATA: COVID-19 12/27>>negative  Influenza PCR 12/27>>negative  Blood x2 12/27>> Urine 12/27>>  ABX: Azithromycin x1 dose 12/27 Ceftriaxone x1 dose 12/27 Unasyn 12/27>>  HISTORY OF PRESENT ILLNESS:   This is a 58 yo female with a PMH of OSA, HTN,  COPD, Headaches, Chronic Home O2 @2 -3LType II Diabetes Mellitus, Asthma, Former Smoker, Atypical Chest Pain, Morbid Obesity, and CHF with preserved LV function (Echo 02/2019 EF 60 to 65%).  She presented to [facility] ER via EMS on 12/27 with c/o headache onset 2 days prior to presentation.  Per ER notes EMS reported pt lethargic and "slow" to answer questions upon their arrival at her home. She wears chronic home O2, and on 3L her O2 sats were 91%.  There was also concern regarding COVID-19 exposure because her grandson tested positive, and she had been around him on Christmas.  Per ER notes on arrival pt initially alert, oriented, and O2 sats were 97% on 3L. She received 500 ml NS bolus, iv zofran, 30 mg iv toradol, and 25 mg iv benadryl.  However, she later became hypoxic with O2 sats 82% on 3L and unresponsive with snoring respirations requiring mechanical intubation.  Lab results revealed K+ 5.4, glucose 162, BNP 2,488, troponin 34, wbc 16.0, and UA negative for UTI.  Post intubation ABG resulted: pH 7.38/pCO2 61/pO2 213/acid-base excess 8.7/bicarb 36.1.  Influenza PCR and COVID-19 negative.  CXR negative for acute abnormalities, but revealed bibasilar scarring.  She received ceftriaxone, azithromycin, and duoneb x1 dose.  CT Head negative. CTA Chest negative for pulmonary embolism, however revealed small amount of gas in the right atrium and central pulmonary artery as well as in the superficial veins of the upper chest, findings likely iatrogenic and related to intravenous access concerning for possible air embolism.  She was subsequently admitted to ICU for additional workup and treatment.    PAST MEDICAL HISTORY :   has a past medical history of Asthma, CHF (congestive heart failure) ([place]), COPD (chronic obstructive pulmonary disease) ([place]), Diabetes mellitus  without complication ([place]), Hypertension, and Sleep apnea.  has a past surgical history that includes Colon surgery and Abdominal hysterectomy. Prior  to Admission medications   Medication Sig Start Date End Date Taking? Authorizing Provider  albuterol (VENTOLIN HFA) 108 (90 Base) MCG/ACT inhaler Inhale 2 puffs into the lungs every 6 (six) hours as needed for wheezing or shortness of breath. 03/06/19   Fritzi Mandes, MD  azithromycin [place] [place]) 250 MG tablet Use as directed 04/26/19   Kendell Bane, NP  baclofen (LIORESAL) 10 MG tablet Take 10 mg by mouth 3 (three) times daily. 01/13/19   [provider]  busPIRone (BUSPAR) 10 MG tablet Take 10 mg by mouth 2 (two) times daily. 04/14/19   [provider]  cetirizine (ZYRTEC) 10 MG tablet cetirizine 10 mg tablet  take 1 tablet by mouth once daily    [provider]  chlorpheniramine-HYDROcodone (TUSSIONEX PENNKINETIC ER) 10-8 MG/5ML SUER Take 5 mLs by mouth at bedtime as needed for cough. 05/01/19   Kendell Bane, NP  clonazePAM (KLONOPIN) 0.5 MG tablet Take 1 tablet (0.5 mg total) by mouth 2 (two) times daily. 04/26/19   Kendell Bane, NP  DULoxetine (CYMBALTA) 30 MG capsule Take 1 capsule (30 mg total) by mouth 2 (two) times daily. 03/03/19   Max Sane, MD  escitalopram (LEXAPRO) 10 MG tablet Take 10 mg by mouth daily. 04/14/19   [provider]  fluticasone furoate-vilanterol (BREO ELLIPTA) 200-25 MCG/INH AEPB Inhale 1 puff into the lungs daily. 09/09/16   Fritzi Mandes, MD  Fluticasone-Salmeterol (ADVAIR DISKUS) 250-50 MCG/DOSE AEPB Inhale 1 puff into the lungs 2 (two) times daily. 03/06/19 03/05/20  Fritzi Mandes, MD  furosemide (LASIX) 40 MG tablet Take 40 mg by mouth 2 (two) times daily. 04/05/19   [provider]  ipratropium-albuterol (DUONEB) 0.5-2.5 (3) MG/3ML SOLN Inhale 3 mLs into the lungs 4 (four) times daily. 03/20/19 03/14/20  [provider]  lisinopril (ZESTRIL) 20 MG tablet Take 20 mg by mouth daily. 01/04/19   [provider]  naproxen (NAPROSYN) 500 MG tablet Take 500 mg by mouth 2 (two) times daily. 03/21/19   [provider]  predniSONE (DELTASONE) 10 MG tablet Take one tab by mouth daily. 05/01/19   Kendell Bane, NP   No Known Allergies  FAMILY HISTORY:  She was adopted. Family history is unknown by patient. SOCIAL HISTORY:  reports that she has quit smoking. She has never used smokeless tobacco. She reports that she does not drink alcohol or use drugs.  REVIEW OF SYSTEMS:   Unable to assess pt mechanically intubated   SUBJECTIVE:  Unable to assess pt mechanically intubated  VITAL SIGNS: Temp:  [98.1 F (36.7 C)-99.7 F (37.6 C)] 98.6 F (37 C) (12/29 0200) Pulse Rate:  [88-119] 93 (12/29 0600) Resp:  [12-40] 28 (12/29 0600) BP: (90-114)/(56-76) 102/66 (12/29 0600) SpO2:  [89 %-96 %] 90 % (12/29 0812) FiO2 (%):  [45 %-50 %] 50 % (12/29 0416) Weight:  [95.2 kg] 95.2 kg (12/29 0500)  PHYSICAL EXAMINATION: General: acutely ill appearing female, NAD mechanically intubated  Neuro: sedated, not following commands, PERRL  HEENT: bilateral sclera errythemia, supple, no JVD  Cardiovascular: nsr, rrr, no R/G  Lungs: diminished throughout, even , non labored  Abdomen: +BS x4, soft, non distended, non tender  Musculoskeletal: normal bulk and tone, no edema  Skin: intact no rashes or lesions present   Recent Labs  Lab 06/04/19 1910 06/05/19 0452 06/06/19 0522  NA 139  142 139  K 5.4* 4.2 4.5  CL 94* 97* 94*  CO2 36* 27 32  BUN 16 15 21*  CREATININE 0.82 0.80 0.73  GLUCOSE 162* 188* 158*   Recent Labs  Lab 06/04/19 1910 06/05/19 0452 06/06/19 0522  HGB 16.8* 15.3* 14.4  HCT 54.5* 48.8* 46.9*  WBC 16.0* 7.4 10.2  PLT 217 197 219   DG Abd 1 View  Result Date: 06/05/2019 CLINICAL DATA:  OG tube placement EXAM: ABDOMEN - 1 VIEW COMPARISON:  Radiograph 05/01/2011, CT a chest 05/05/2019 FINDINGS: A transesophageal tube tip and side port terminate in the region of the gastric body, beyond the GE junction. Pacer pads overlie the chest. Additional support devices including  catheter tubing and telemetry leads overlie the upper abdomen. Bowel gas pattern is unremarkable. Persisting consolidation partially obscuring the left hemidiaphragm. Additional atelectatic changes in the right lung base. IMPRESSION: 1. A transesophageal tube tip and side port terminate in the region of the gastric body, beyond the GE junction. 2. Persisting consolidation partially obscuring the left hemidiaphragm. Electronically Signed   By: Lovena Le M.D.   On: 06/05/2019 00:40   CT Head Wo Contrast  Result Date: 06/04/2019 CLINICAL DATA:  Acute hypoxic respiratory failure, altered mental status EXAM: CT HEAD WITHOUT CONTRAST TECHNIQUE: Contiguous axial images were obtained from the base of the skull through the vertex without intravenous contrast. COMPARISON:  CT head 02/27/2019 FINDINGS: Brain: No evidence of acute infarction, hemorrhage, hydrocephalus, extra-axial collection or mass lesion/mass effect. Vascular: Atherosclerotic calcification of the carotid siphons. No hyperdense vessel. Skull: No calvarial fracture or suspicious osseous lesion. No scalp swelling or hematoma. Sinuses/Orbits: Paranasal sinuses and mastoid air cells are predominantly clear. Included orbital structures are unremarkable. Other: None IMPRESSION: No acute intracranial abnormality. Electronically Signed   By: Lovena Le M.D.   On: 06/04/2019 22:59   CT Angio Chest PE W/Cm &/Or Wo Cm  Result Date: 06/04/2019 CLINICAL DATA:  Acute hypoxic respiratory failure, negative COVID-19 testing EXAM: CT ANGIOGRAPHY CHEST WITH CONTRAST TECHNIQUE: Multidetector CT imaging of the chest was performed using the standard protocol during bolus administration of intravenous contrast. Multiplanar CT image reconstructions and MIPs were obtained to evaluate the vascular anatomy. CONTRAST:  73mL OMNIPAQUE IOHEXOL 350 MG/ML SOLN COMPARISON:  Chest CTA 04/20/2019 FINDINGS: Cardiovascular: Satisfactory opacification the pulmonary arteries to the  segmental level. No pulmonary artery filling defects are identified. Central pulmonary arteries remain normal caliber. Small amount of gas in the right atrium and central pulmonary artery as well as in the superficial veins of the upper chest. Finding is likely iatrogenic and related to intravenous access. Cardiac size at the upper limits of normal. No pericardial effusion. Coronary artery calcifications are minimal. Atherosclerotic plaque within the normal caliber aorta. Normal 3 vessel branching of the aortic arch. Minimal calcifications in the proximal great vessels. Mediastinum/Nodes: Thyroid and thoracic inlet is unremarkable. No mediastinal, hilar or axillary adenopathy. The thoracic esophagus is patulous and fluid-filled. A transesophageal tube terminates below the level of imaging with the side port beyond the GE junction. Patient is intubated at this time with the endotracheal tip approximately 3.4 cm from the carina. Lungs/Pleura: There are several bandlike areas of opacity similar to comparison likely reflective of scarring/architectural distortion. Some dependent consolidated lung in the left base with volume loss, much of which is likely atelectatic however some mixed hypoattenuation of the consolidative lung could suggest underlying infection or aspiration. Upper Abdomen: Trace amount of perihepatic ascites. Stable appearance of the bilateral adrenal  adenomas. Musculoskeletal: No acute osseous abnormality or suspicious osseous lesion. Mild exaggerated thoracic kyphosis with multilevel discogenic change. Review of the MIP images confirms the above findings. IMPRESSION: 1. Negative for acute pulmonary embolus. 2. Small amount of gas in the right atrium and central pulmonary artery as well as in the superficial veins of the upper chest. Finding is likely iatrogenic and related to intravenous access. 3. Consolidated lung in the left base with volume loss, much of which is likely atelectatic however some  mixed hypoattenuation of the consolidative lung could suggest underlying infection or aspiration particularly given a patulous, fluid-filled esophagus despite NG placement. 4. Stable bilateral adrenal adenomas. 5. Trace amount of perihepatic ascites, new from prior. 6. Aortic Atherosclerosis (ICD10-I70.0). Electronically Signed   By: Lovena Le M.D.   On: 06/04/2019 22:57   DG Chest Portable 1 View  Result Date: 06/04/2019 CLINICAL DATA:  Headache, respiratory failure, became unresponsive, family positive for COVID-19; past history asthma, CHF, COPD, diabetes mellitus, hypertension EXAM: PORTABLE CHEST 1 VIEW COMPARISON:  Portable exam 1903 hours compared to 04/20/2019 FINDINGS: Tip of endotracheal tube projects 5.6 cm above carina. External pacing leads project over chest. Normal heart size and mediastinal contours. Atherosclerotic calcification aorta. Chronic accentuation of bibasilar markings. No definite acute infiltrate, pleural effusion or pneumothorax. IMPRESSION: Bibasilar scarring without acute abnormalities. Electronically Signed   By: Lavonia Dana M.D.   On: 06/04/2019 19:32      ASSESSMENT / PLAN:  Acute on chronic hypercapnic hypoxic respiratory failure secondary to possible air embolism based on CTA Chest findings as described above and aspiration  Mechanical intubation  Hx: OSA and Asthma -Extubated yesterday and now imporved post chest physiotherapy Scheduled and prn bronchodilator therapy  -upgrade BPH for LLL atelectasis -procal 0.11 and lactate 1.8 on admission   Hypertension-controlled Mildly elevated troponin likely demand ischemia in setting of acute on chronic respiratory failure  Hx: HLD and Chronic CHF with preserved LV function  Continuous telemetry monitoring  Will hold outpatient antihypertensives for now  Trend troponin's   Hyperkalemia etiology unknown  Trend BMP  Monitor for EKG changes Monitor UOP   Leukocytosis possibly secondary to aspiration  pneumonia  Trend WBC and monitor fever curve  Trend PCT Follow cultures  Will start unasyn   Type II Diabetes Mellitus  CBG's q4hrs  SSI   Acute encephalopathy possibly secondary to hypoxia concerning for possible ischemic CVA  Mechanical intubation pain/discomfort  Acute pain secondary to headaches (CT Head 12/27 negative for acute abnormality.  Pt has a hx of headaches)  Maintain RASS goal -1 to -2  Propofol gtt and prn fentanyl to maintain RASS goal and for pain management  WUA daily Urine drug screen and alcohol level pending  Daily aspirin  MRI Brain pending   BEST PRACTICE: VTE px: subq lovenox  SUP px: iv pepcid  Diet: Will start TF's if pt remains intubated in the next 24hrs  .  Critical care provider statement:    Critical care time (minutes):  32   Critical care time was exclusive of:  Separately billable procedures and  treating other patients   Critical care was necessary to treat or prevent imminent or  life-threatening deterioration of the following conditions:  acute hypoxemic respiratory failure, advanced copd, multiple comorbid conditions.    Critical care was time spent personally by me on the following  activities:  Development of treatment plan with patient or surrogate,  discussions with consultants, evaluation of patient's response to  treatment, examination of  patient, obtaining history from patient or  surrogate, ordering and performing treatments and interventions, ordering  and review of laboratory studies and re-evaluation of patient's condition   I assumed direction of critical care for this patient from another  provider in my specialty: no     Ottie Glazier, M.D.  [facility] [place]

## 2019-06-06 NOTE — Evaluation (Signed)
Physical Therapy Evaluation Patient Details Name: Shelly [name] MRN: [id number] DOB: Feb 23, 1961 Today's Date: 06/06/2019   History of Present Illness  58 y/o female patient came in with headache, while being worked up for that developed increasing shortness of breath. Intubated 12/27 and extubated 12/28 . Found to have air embolism on chest CT. PMH includes: OSA, HTN, COPD, home O2, DM, obesity, CHF.  Clinical Impression  Patient received in bed, ready to try to get up. Reports LE swelling and discomfort. She requires supervision for supine to sit using rails and increased time. Min guard for sit to stand. Patient has increased pain in B LEs with standing. She is able to walk 5 feet with RW and min guard with assist to manage lines. Performed LE strengthening exercises at end of session. Patient will continue to benefit from skilled PT while here to improve strength and functional independence.        Follow Up Recommendations Home health PT    Equipment Recommendations  Other (comment)(shower chair)    Recommendations for Other Services       Precautions / Restrictions Precautions Precautions: Fall Restrictions Weight Bearing Restrictions: No      Mobility  Bed Mobility Overal bed mobility: Modified Independent             General bed mobility comments: use of bed rails, increased time  Transfers Overall transfer level: Needs assistance Equipment used: Rolling walker (2 wheeled) Transfers: Sit to/from Stand Sit to Stand: Min guard            Ambulation/Gait Ambulation/Gait assistance: Min guard Gait Distance (Feet): 5 Feet Assistive device: Rolling walker (2 wheeled) Gait Pattern/deviations: Step-through pattern;Shuffle Gait velocity: decreased   General Gait Details: slow, steady pace, required assist to manage lines.  Stairs            Wheelchair Mobility    Modified Rankin (Stroke Patients Only)       Balance Overall balance assessment:  Modified Independent;Needs assistance Sitting-balance support: Feet supported Sitting balance-Leahy Scale: Good     Standing balance support: Bilateral upper extremity supported;During functional activity Standing balance-Leahy Scale: Fair Standing balance comment: reliant on RW                             Pertinent Vitals/Pain Pain Assessment: Faces Faces Pain Scale: Hurts whole lot Pain Location: B lower legs with standing and to touch Pain Descriptors / Indicators: Discomfort;Tender Pain Intervention(s): Limited activity within patient's tolerance;Monitored during session;Repositioned    Home Living Family/patient expects to be discharged to:: Private residence Living Arrangements: Spouse/significant other Available Help at Discharge: Family Type of Home: House Home Access: Stairs to enter Entrance Stairs-Rails: Psychiatric nurse of Steps: 4 Home Layout: One level Home Equipment: Grab bars - tub/shower      Prior Function Level of Independence: Independent with assistive device(s)         Comments: O2 use at home 3-4 liters     Hand Dominance        Extremity/Trunk Assessment   Upper Extremity Assessment Upper Extremity Assessment: Generalized weakness    Lower Extremity Assessment Lower Extremity Assessment: Generalized weakness    Cervical / Trunk Assessment Cervical / Trunk Assessment: Normal  Communication   Communication: No difficulties  Cognition Arousal/Alertness: Awake/alert Behavior During Therapy: WFL for tasks assessed/performed Overall Cognitive Status: Within Functional Limits for tasks assessed  General Comments      Exercises Other Exercises Other Exercises: B LE seated LAQ, hip abd/add, AP x 10 reps each   Assessment/Plan    PT Assessment Patient needs continued PT services  PT Problem List Decreased strength;Decreased activity  tolerance;Decreased mobility;Pain       PT Treatment Interventions Therapeutic exercise;Gait training;Stair training;Functional mobility training;Therapeutic activities;Patient/family education    PT Goals (Current goals can be found in the Care Plan section)  Acute Rehab PT Goals Patient Stated Goal: to return home, feel better PT Goal Formulation: With patient Time For Goal Achievement: 07/08/2019 Potential to Achieve Goals: Fair    Frequency Min 2X/week   Barriers to discharge Inaccessible home environment 4-5 steps to enter home    Co-evaluation               AM-PAC PT "6 Clicks" Mobility  Outcome Measure Help needed turning from your back to your side while in a flat bed without using bedrails?: A Little Help needed moving from lying on your back to sitting on the side of a flat bed without using bedrails?: A Little Help needed moving to and from a bed to a chair (including a wheelchair)?: A Little Help needed standing up from a chair using your arms (e.g., wheelchair or bedside chair)?: A Little Help needed to walk in hospital room?: A Little Help needed climbing 3-5 steps with a railing? : A Lot 6 Click Score: 17    End of Session Equipment Utilized During Treatment: Gait belt Activity Tolerance: Patient tolerated treatment well Patient left: in chair;with call bell/phone within reach Nurse Communication: Mobility status;Other (comment)(B LE pain and swelling, O2 sats dropped and O2 raised to 7 lpm from 5-6) PT Visit Diagnosis: Pain;Difficulty in walking, not elsewhere classified (R26.2);Muscle weakness (generalized) (M62.81) Pain - Right/Left: (B LEs) Pain - part of body: Leg    Time: [id number] PT Time Calculation (min) (ACUTE ONLY): 30 min   Charges:   PT Evaluation $PT Eval Moderate Complexity: 1 Mod PT Treatments $Gait Training: 8-22 mins        Alannis Hsia, PT, GCS 06/06/19,12:29 PM

## 2019-06-06 NOTE — Evaluation (Signed)
Occupational Therapy Evaluation Patient Details Name: Shelly [name] MRN: [id number] DOB: 05-25-1961 Today's Date: 06/06/2019    History of Present Illness 58 y/o female patient came in with headache, while being worked up for that developed increasing shortness of breath. Intubated 12/27 and extubated 12/28 . Found to have air embolism on chest CT. PMH includes: OSA, HTN, COPD, home O2, DM, obesity, CHF.   Clinical Impression   Shelly [name] was seen for OT evaluation this date. PTA pt reports she was independent in all ADLs and mobility, living in a 1 story home with 5 steps to enter. Pt on 3-4 liters of O2 at home. Pt reports becoming easily fatigued or out of breath with minimal exertion. 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 Pt intermittently tearful during OT evaluation and reports significant family stressors have negatively impacted her health. RN notified pt may benefit from consult from chaplain services during admission. OT utilizes therapeutic use of self throughout evaluation to provide emotional support and encouragement. Pt currently requires minimal assist for heavy ADL tasks including bathing and dressing due to increased SOB, generalized weakness, and poor activity tolerance. Pt educated in energy conservation conservation strategies including pursed lip breathing, activity pacing, home/routines modifications, work simplification, AE/DME, prioritizing of meaningful occupations, and falls prevention. Handout provided. Pt verbalized understanding but would benefit from additional skilled OT services to maximize recall and carryover of learned techniques and facilitate implementation of learned techniques into daily routines. Upon discharge, recommend [facility] services.       Follow Up Recommendations  Home health OT    Equipment Recommendations  3 in 1 bedside commode    Recommendations for Other Services        Precautions / Restrictions Precautions Precautions: Fall Restrictions Weight Bearing Restrictions: No      Mobility Bed Mobility Overal bed mobility: Modified Independent             General bed mobility comments: use of bed rails, increased time  Transfers Overall transfer level: Needs assistance Equipment used: Rolling walker (2 wheeled) Transfers: Sit to/from Stand Sit to Stand: Min guard              Balance Overall balance assessment: Modified Independent;Needs assistance Sitting-balance support: Feet supported Sitting balance-Leahy Scale: Good     Standing balance support: Bilateral upper extremity supported;During functional activity Standing balance-Leahy Scale: Fair Standing balance comment: reliant on RW                           ADL either performed or assessed with clinical judgement   ADL Overall ADL's : Needs assistance/impaired                                       General ADL Comments: Pt presents with generalized weakness, SOB, and significant pain in BLE which functionally limit her ability to perform self-care tasks/manage ADLs. Pt requires at least min assist for LB ADLs including bathing and dressing. Would benefit from futher education in energy conservation and use of AE to support functional independence and safety during ADL management upon hospital DC.     Vision Baseline Vision/History: Wears glasses Wears Glasses: At all times Patient Visual Report: No change from baseline       Perception     Praxis  Pertinent Vitals/Pain Pain Assessment: Faces Faces Pain Scale: Hurts little more Pain Location: B lower legs with standing and to touch Pain Descriptors / Indicators: Tender;Discomfort Pain Intervention(s): Limited activity within patient's tolerance;Monitored during session;Premedicated before session     Hand Dominance Right   Extremity/Trunk Assessment Upper Extremity Assessment Upper  Extremity Assessment: Generalized weakness   Lower Extremity Assessment Lower Extremity Assessment: Generalized weakness(With significant pain with mobility in BLE)   Cervical / Trunk Assessment Cervical / Trunk Assessment: Normal   Communication Communication Communication: No difficulties   Cognition Arousal/Alertness: Awake/alert Behavior During Therapy: WFL for tasks assessed/performed Overall Cognitive Status: Within Functional Limits for tasks assessed                                 General Comments: Pt intermittently tearful about family stressors t/o OT evaluation. OT utilized therapeutic use of self to provide emotional support and encouragement. RN notified pt may benefit from visit from hospital chaplin.   General Comments       Exercises Other Exercises Other Exercises: B LE seated LAQ, hip abd/add, AP x 10 reps each Other Exercises: Pt educated in falls prevention strategies, safe use of AE for ADL/functional mobility, and energy conservation strategies including PLB, routines modifications, activity pacing, and prioritizing meaningful occupations. Handout provided. Would benefit from review.   Shoulder Instructions      Home Living Family/patient expects to be discharged to:: Private residence Living Arrangements: Spouse/significant other Available Help at Discharge: Family Type of Home: House Home Access: Stairs to enter Technical brewer of Steps: 5 Entrance Stairs-Rails: Right;Left Home Layout: One level     Bathroom Shower/Tub: Tub only   Biochemist, clinical: Standard     Home Equipment: Grab bars - tub/shower          Prior Functioning/Environment Level of Independence: Independent with assistive device(s)        Comments: O2 use at home 3-4 liters        OT Problem List: Decreased strength;Decreased coordination;Cardiopulmonary status limiting activity;Pain;Decreased activity tolerance;Decreased safety awareness;Impaired  balance (sitting and/or standing);Increased edema;Decreased knowledge of use of DME or AE      OT Treatment/Interventions: Self-care/ADL training;Therapeutic exercise;Therapeutic activities;Energy conservation;DME and/or AE instruction;Patient/family education;Balance training    OT Goals(Current goals can be found in the care plan section) Acute Rehab OT Goals Patient Stated Goal: to return home, feel better OT Goal Formulation: With patient Time For Goal Achievement: 07/01/19 Potential to Achieve Goals: Good ADL Goals Pt Will Perform Lower Body Dressing: sit to/from stand;with supervision;with set-up;with adaptive equipment(LRAD PRN for safety) Pt Will Transfer to Toilet: ambulating;bedside commode;regular height toilet;with supervision;with set-up(LRAD PRN for safety) Pt Will Perform Toileting - Clothing Manipulation and hygiene: sit to/from stand;with supervision;with set-up(LRAD PRN for safety) Additional ADL Goal #1: Pt will independently verbalize a plan to implement at least 3 learned energy conservation strategies into her daily routines/home environment for improved safety and functional independence upon hospital DC.  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       Co-evaluation              AM-PAC OT "6 Clicks" Daily Activity     Outcome Measure Help from another person eating meals?: None Help from another person taking care of personal grooming?: A Little Help from another person toileting, which includes  using toliet, bedpan, or urinal?: A Little Help from another person bathing (including washing, rinsing, drying)?: A Little Help from another person to put on and taking off regular upper body clothing?: A Little Help from another person to put on and taking off regular lower body clothing?: A Little 6 Click Score: 19   End of Session  Nurse Communication: Other (comment)(Pt tearful about family stressors, may benefit from chaplain consult.)  Activity Tolerance: Patient tolerated treatment well Patient left: in chair;with call bell/phone within reach;with chair alarm set  OT Visit Diagnosis: Other abnormalities of gait and mobility (R26.89);Muscle weakness (generalized) (M62.81)                Time: 1351-1430 OT Time Calculation (min): 39 min Charges:  OT General Charges $OT Visit: 1 Visit OT Evaluation $OT Eval Moderate Complexity: 1 Mod OT Treatments $Self Care/Home Management : 23-37 mins  Shara Blazing, M.S., OTR/L Ascom: [phone] 06/06/19, 4:02 PM

## 2019-06-06 NOTE — Progress Notes (Signed)
PHARMACIST - PHYSICIAN COMMUNICATION   CONCERNING: IV to Oral Route Change Policy  RECOMMENDATION: This patient is receiving famotidine by the intravenous route.  Based on criteria approved by the Pharmacy and Therapeutics Committee, the intravenous medication(s) is/are being converted to the equivalent oral dose form(s).   DESCRIPTION: These criteria include:  The patient is eating (either orally or via tube) and/or has been taking other orally administered medications for a least 24 hours  The patient has no evidence of active gastrointestinal bleeding or impaired GI absorption (gastrectomy, short bowel, patient on TNA or NPO).  If you have questions about this conversion, please contact the [place], PharmD 06/06/2019 2:49 PM

## 2019-06-07 ENCOUNTER — Other Ambulatory Visit: Payer: Self-pay | Admitting: Adult Health

## 2019-06-07 ENCOUNTER — Inpatient Hospital Stay (HOSPITAL_COMMUNITY)
Admit: 2019-06-07 | Discharge: 2019-06-07 | Disposition: A | Discharge status: Home / Self Care | Payer: Medicare Other | Attending: Family Medicine | Admitting: Family Medicine

## 2019-06-07 DIAGNOSIS — Z886 Allergy status to analgesic agent status: Secondary | ICD-10-CM

## 2019-06-07 DIAGNOSIS — L818 Other specified disorders of pigmentation: Secondary | ICD-10-CM

## 2019-06-07 DIAGNOSIS — G4733 Obstructive sleep apnea (adult) (pediatric): Secondary | ICD-10-CM

## 2019-06-07 DIAGNOSIS — J9621 Acute and chronic respiratory failure with hypoxia: Secondary | ICD-10-CM

## 2019-06-07 DIAGNOSIS — I509 Heart failure, unspecified: Secondary | ICD-10-CM

## 2019-06-07 DIAGNOSIS — B955 Unspecified streptococcus as the cause of diseases classified elsewhere: Secondary | ICD-10-CM

## 2019-06-07 DIAGNOSIS — I5032 Chronic diastolic (congestive) heart failure: Secondary | ICD-10-CM

## 2019-06-07 DIAGNOSIS — J690 Pneumonitis due to inhalation of food and vomit: Secondary | ICD-10-CM

## 2019-06-07 DIAGNOSIS — J9611 Chronic respiratory failure with hypoxia: Secondary | ICD-10-CM

## 2019-06-07 DIAGNOSIS — Z87891 Personal history of nicotine dependence: Secondary | ICD-10-CM

## 2019-06-07 DIAGNOSIS — R7881 Bacteremia: Secondary | ICD-10-CM

## 2019-06-07 DIAGNOSIS — B954 Other streptococcus as the cause of diseases classified elsewhere: Secondary | ICD-10-CM

## 2019-06-07 DIAGNOSIS — F419 Anxiety disorder, unspecified: Secondary | ICD-10-CM

## 2019-06-07 DIAGNOSIS — Z888 Allergy status to other drugs, medicaments and biological substances status: Secondary | ICD-10-CM

## 2019-06-07 DIAGNOSIS — E119 Type 2 diabetes mellitus without complications: Secondary | ICD-10-CM

## 2019-06-07 DIAGNOSIS — J449 Chronic obstructive pulmonary disease, unspecified: Secondary | ICD-10-CM

## 2019-06-07 LAB — PROCALCITONIN: Procalcitonin: <0.1 ng/mL

## 2019-06-07 LAB — CBC
HCT: 43.5 % (ref 36.0–46.0)
Hemoglobin: 14.1 g/dL (ref 12.0–15.0)
MCH: 29.6 pg (ref 26.0–34.0)
MCHC: 32.4 g/dL (ref 30.0–36.0)
MCV: 91.4 fL (ref 80.0–100.0)
Platelets: 208 10*3/uL (ref 150–400)
RBC: 4.76 MIL/uL (ref 3.87–5.11)
RDW: 17.1 % — ABNORMAL HIGH (ref 11.5–15.5)
WBC: 9 10*3/uL (ref 4.0–10.5)
nRBC: 0 % (ref 0.0–0.2)

## 2019-06-07 LAB — BASIC METABOLIC PANEL
Anion gap: 6 (ref 5–15)
BUN: 27 mg/dL — ABNORMAL HIGH (ref 6–20)
CO2: 37 mmol/L — ABNORMAL HIGH (ref 22–32)
Calcium: 8.6 mg/dL — ABNORMAL LOW (ref 8.9–10.3)
Chloride: 96 mmol/L — ABNORMAL LOW (ref 98–111)
Creatinine, Ser: 0.71 mg/dL (ref 0.44–1.00)
GFR calc Af Amer: >60 mL/min (ref >60)
GFR calc non Af Amer: >60 mL/min (ref >60)
Glucose, Bld: 155 mg/dL — ABNORMAL HIGH (ref 70–99)
Potassium: 4.8 mmol/L (ref 3.5–5.1)
Sodium: 139 mmol/L (ref 135–145)

## 2019-06-07 LAB — ECHOCARDIOGRAM COMPLETE
Height: 65 in
Weight: 3368.63 [oz_av]

## 2019-06-07 LAB — BLOOD GAS, ARTERIAL
Acid-Base Excess: 8.7 mmol/L — ABNORMAL HIGH (ref 0.0–2.0)
Bicarbonate: 36.1 mmol/L — ABNORMAL HIGH (ref 20.0–28.0)
FIO2: 70
Mechanical Rate: 20
O2 Saturation: 99.7 %
PEEP: 5 cmH2O
Patient temperature: 37
pCO2 arterial: 61 mmHg — ABNORMAL HIGH (ref 32.0–48.0)
pH, Arterial: 7.38 (ref 7.350–7.450)
pO2, Arterial: 213 mmHg — ABNORMAL HIGH (ref 83.0–108.0)

## 2019-06-07 LAB — CULTURE, BLOOD (ROUTINE X 2): Special Requests: ADEQUATE

## 2019-06-07 LAB — GLUCOSE, CAPILLARY
Glucose-Capillary: 166 mg/dL — ABNORMAL HIGH (ref 70–99)
Glucose-Capillary: 110 mg/dL — ABNORMAL HIGH (ref 70–99)
Glucose-Capillary: 153 mg/dL — ABNORMAL HIGH (ref 70–99)
Glucose-Capillary: 234 mg/dL — ABNORMAL HIGH (ref 70–99)
Glucose-Capillary: 172 mg/dL — ABNORMAL HIGH (ref 70–99)

## 2019-06-07 MED ORDER — LORATADINE 10 MG PO TABS
10.0000 mg | ORAL_TABLET | Freq: Every day | ORAL | Status: DC
  Administered 2019-06-07 – 2019-06-08 (×2): 10 mg via ORAL
  Filled 2019-06-07 (×2): qty 1

## 2019-06-07 MED ORDER — MOMETASONE FURO-FORMOTEROL FUM 200-5 MCG/ACT IN AERO: 2.0000 | INHALATION_SPRAY | Freq: Two times a day (BID) | RESPIRATORY_TRACT | Status: DC

## 2019-06-07 MED ORDER — ASPIRIN 81 MG PO CHEW
81.0000 mg | CHEWABLE_TABLET | Freq: Every day | ORAL | Status: DC
  Administered 2019-06-08: 81 mg via ORAL
  Filled 2019-06-07: qty 1

## 2019-06-07 MED ORDER — INSULIN ASPART 100 UNIT/ML ~~LOC~~ SOLN
0.0000 [IU] | Freq: Three times a day (TID) | SUBCUTANEOUS | Status: DC
  Administered 2019-06-07: 3 [IU] via SUBCUTANEOUS
  Administered 2019-06-07: 5 [IU] via SUBCUTANEOUS
  Filled 2019-06-07 (×2): qty 1

## 2019-06-07 MED ORDER — DULOXETINE HCL 30 MG PO CPEP
30.0000 mg | ORAL_CAPSULE | Freq: Two times a day (BID) | ORAL | Status: DC
  Administered 2019-06-07 – 2019-06-08 (×2): 30 mg via ORAL
  Filled 2019-06-07 (×3): qty 1

## 2019-06-07 MED ORDER — FLUTICASONE FUROATE-VILANTEROL 200-25 MCG/INH IN AEPB
1.0000 | INHALATION_SPRAY | Freq: Every day | RESPIRATORY_TRACT | Status: DC
  Administered 2019-06-07 – 2019-06-08 (×2): 1 via RESPIRATORY_TRACT
  Filled 2019-06-07 (×2): qty 28

## 2019-06-07 MED ORDER — FUROSEMIDE 40 MG PO TABS
40.0000 mg | ORAL_TABLET | Freq: Two times a day (BID) | ORAL | Status: DC
  Administered 2019-06-07 – 2019-06-08 (×2): 40 mg via ORAL
  Filled 2019-06-07: qty 1
  Filled 2019-06-07: qty 2

## 2019-06-07 MED ORDER — BACLOFEN 10 MG PO TABS
10.0000 mg | ORAL_TABLET | Freq: Three times a day (TID) | ORAL | Status: DC | PRN
  Administered 2019-06-07 – 2019-06-08 (×3): 10 mg via ORAL
  Filled 2019-06-07 (×4): qty 1

## 2019-06-07 MED ORDER — SODIUM CHLORIDE 0.9 % IV SOLN
3.0000 g | Freq: Four times a day (QID) | INTRAVENOUS | Status: DC
  Administered 2019-06-07 – 2019-06-08 (×5): 3 g via INTRAVENOUS
  Filled 2019-06-07: qty 8
  Filled 2019-06-07: qty 3
  Filled 2019-06-07 (×2): qty 8
  Filled 2019-06-07 (×2): qty 3
  Filled 2019-06-07 (×3): qty 8

## 2019-06-07 MED ORDER — BUSPIRONE HCL 10 MG PO TABS
10.0000 mg | ORAL_TABLET | Freq: Two times a day (BID) | ORAL | Status: DC
  Administered 2019-06-07 – 2019-06-08 (×2): 10 mg via ORAL
  Filled 2019-06-07 (×3): qty 1

## 2019-06-07 MED ORDER — FLUCONAZOLE 50 MG PO TABS
150.0000 mg | ORAL_TABLET | Freq: Once | ORAL | Status: AC
  Administered 2019-06-07: 150 mg via ORAL
  Filled 2019-06-07: qty 1

## 2019-06-07 NOTE — Consult Note (Signed)
NAME: Shelly [name]  DOB: [birth year]  MRN: [id number]  Date/Time: 06/07/2019 12:26 PM  REQUESTING PROVIDER: Sarajane Jews Subjective:  REASON FOR CONSULT: Strep viridans bacteremia ?History from patient and chart Shelly [name] is a 58 y.o. female with a history of COPD/ CHF/ Dm/?OSA was admitted on Sunday with weakness, unable to stay awake and headache. Pt says her symptoms have been present for 2 days and on Sunday she was very tired and unable to stay awake and pulse ox at home wa sin the 80s and EMS was called and she was brought to the ED- In the ED her main c/o was headache and as she was being treated for that she became obtunded with resp distress and was intubated around 6.30 pm. ABG after intubation showed a Co2 of 61  She had a  CTA that r/o PE but showed Small amount of gas in the right atrium and central pulmonary artery as well as in the superficial veins of the upper chest.Finding is likely iatrogenic and related to intravenous access. Air embolism was questioned- She was transferred to ICU and started on unasyn for possible aspiration pneumonitis- She was extubated on 06/05/19 and Unasyn was discontinued yesterday- She was restarted today as Blood culture had strep viridans I  Am asked to see the patient for the same Pt has no prosthetic devic expect for a mandible implant. She had no fever at home She says she has been intubated before ( 3 months ago) for similar presentation and had hypercapnia then. Past Medical History:  Diagnosis Date  . Asthma   . CHF (congestive heart failure) ([place])   . COPD (chronic obstructive pulmonary disease) ([place])   . Diabetes mellitus without complication ([place])   . Hypertension   . Sleep apnea     Past Surgical History:  Procedure Laterality Date  . ABDOMINAL HYSTERECTOMY    . COLON SURGERY      Social History   Socioeconomic History  . Marital status: Married    Spouse name: Not on file  . Number of children: Not on file  . Years of  education: Not on file  . Highest education level: Not on file  Occupational History  . Not on file  Tobacco Use  . Smoking status: Former Research scientist (life sciences)  . Smokeless tobacco: Never Used  Substance and Sexual Activity  . Alcohol use: No  . Drug use: No  . Sexual activity: Not on file  Other Topics Concern  . Not on file  Social History Narrative  . Not on file   Social Determinants of Health   Financial Resource Strain:   . Difficulty of Paying Living Expenses: Not on file  Food Insecurity:   . Worried About Charity fundraiser in the Last Year: Not on file  . Ran Out of Food in the Last Year: Not on file  Transportation Needs:   . Lack of Transportation (Medical): Not on file  . Lack of Transportation (Non-Medical): Not on file  Physical Activity:   . Days of Exercise per Week: Not on file  . Minutes of Exercise per Session: Not on file  Stress:   . Feeling of Stress : Not on file  Social Connections:   . Frequency of Communication with Friends and Family: Not on file  . Frequency of Social Gatherings with Friends and Family: Not on file  . Attends Religious Services: Not on file  . Active Member of Clubs or Organizations: Not on file  . Attends  Club or Organization Meetings: Not on file  . Marital Status: Not on file  Intimate Partner Violence:   . Fear of Current or Ex-Partner: Not on file  . Emotionally Abused: Not on file  . Physically Abused: Not on file  . Sexually Abused: Not on file    Family History  Adopted: Yes  Family history unknown: Yes   No Known Allergies   ? Current Facility-Administered Medications  Medication Dose Route Frequency Provider Last Rate Last Admin  . acetaminophen (TYLENOL) tablet 650 mg  650 mg Oral Q4H PRN Awilda Bill, NP   650 mg at 06/07/19 0945  . Ampicillin-Sulbactam (UNASYN) 3 g in sodium chloride 0.9 % 100 mL IVPB  3 g Intravenous Q6H Samuella Cota, MD      . Derrill Memo ON 06/08/2019] aspirin chewable tablet 81 mg  81 mg  Oral Daily Samuella Cota, MD      . baclofen (LIORESAL) tablet 10 mg  10 mg Oral TID PRN Samuella Cota, MD      . bisacodyl (DULCOLAX) suppository 10 mg  10 mg Rectal Daily PRN Awilda Bill, NP      . busPIRone (BUSPAR) tablet 10 mg  10 mg Oral BID Samuella Cota, MD      . Chlorhexidine Gluconate Cloth 2 % PADS 6 each  6 each Topical Daily Flora Lipps, MD   6 each at 06/06/19 2153  . clonazePAM (KLONOPIN) tablet 0.5 mg  0.5 mg Oral BID Ottie Glazier, MD   0.5 mg at 06/07/19 [id number]  . DULoxetine (CYMBALTA) DR capsule 30 mg  30 mg Oral BID Samuella Cota, MD      . enoxaparin (LOVENOX) injection 40 mg  40 mg Subcutaneous Q24H Awilda Bill, NP   40 mg at 06/06/19 2150  . famotidine (PEPCID) tablet 20 mg  20 mg Oral BID Flora Lipps, MD   20 mg at 06/07/19 [id number]  . fluconazole (DIFLUCAN) tablet 150 mg  150 mg Oral Once Samuella Cota, MD      . fluticasone furoate-vilanterol (BREO ELLIPTA) 200-25 MCG/INH 1 puff  1 puff Inhalation Daily Samuella Cota, MD      . furosemide (LASIX) tablet 40 mg  40 mg Oral BID Samuella Cota, MD      . insulin aspart (novoLOG) injection 0-15 Units  0-15 Units Subcutaneous TID WC Samuella Cota, MD      . ipratropium-albuterol (DUONEB) 0.5-2.5 (3) MG/3ML nebulizer solution 3 mL  3 mL Nebulization Q4H Awilda Bill, NP   3 mL at 06/07/19 1127  . ipratropium-albuterol (DUONEB) 0.5-2.5 (3) MG/3ML nebulizer solution 3 mL  3 mL Nebulization Q6H PRN Awilda Bill, NP      . loratadine (CLARITIN) tablet 10 mg  10 mg Oral Daily Samuella Cota, MD      . MEDLINE mouth rinse  15 mL Mouth Rinse BID Ottie Glazier, MD   15 mL at 06/07/19 0939  . predniSONE (DELTASONE) tablet 50 mg  50 mg Oral Q breakfast Ottie Glazier, MD   50 mg at 06/07/19 [id number]     Abtx:  Anti-infectives (From admission, onward)   Start     Dose/Rate Route Frequency Ordered Stop   06/07/19 1300  fluconazole (DIFLUCAN) tablet 150 mg     150 mg Oral  Once  06/07/19 1159     06/07/19 1300  Ampicillin-Sulbactam (UNASYN) 3 g in sodium chloride 0.9 % 100 mL IVPB  3 g 200 mL/hr over 30 Minutes Intravenous Every 6 hours 06/07/19 1216     06/05/19 0200  Ampicillin-Sulbactam (UNASYN) 3 g in sodium chloride 0.9 % 100 mL IVPB  Status:  Discontinued     3 g 200 mL/hr over 30 Minutes Intravenous Every 6 hours 06/04/19 2317 06/06/19 1017   06/04/19 1930  azithromycin (ZITHROMAX) 500 mg in sodium chloride 0.9 % 250 mL IVPB     500 mg 250 mL/hr over 60 Minutes Intravenous  Once 06/04/19 1926 06/04/19 2135   06/04/19 1930  cefTRIAXone (ROCEPHIN) 1 g in sodium chloride 0.9 % 100 mL IVPB     1 g 200 mL/hr over 30 Minutes Intravenous  Once 06/04/19 1926 06/04/19 2021      REVIEW OF SYSTEMS:  Const: negative fever, negative chills, negative weight loss Eyes: negative diplopia or visual changes, negative eye pain ENT: negative coryza, negative sore throat Resp:  cough, no hemoptysis, dyspnea Cards: negative for chest pain, palpitations, has lower extremity edema GU: negative for frequency, dysuria and hematuria GI: Negative for abdominal pain, diarrhea, bleeding, constipation Skin: says she has 2 spots on her body that flares up intermittently Heme: negative for easy bruising and gum/nose bleeding MS: generalized weakness Neurolo: headaches, no dizziness, vertigo, memory problems  Psych: negative for feelings of anxiety, depression  Endocrine: no polyuria Allergy/Immunology- negative for any medication or food allergies ? : Objective:  VITALS:  BP 113/75   Pulse 76   Temp 97.7 F (36.5 C) (Oral)   Resp 14   Ht 5\' 5"  (1.651 m)   Wt 95.5 kg   SpO2 92%   BMI 35.04 kg/m  PHYSICAL EXAM:  General: Alert, cooperative, no distress, appears stated age. , cushingoid looking Head: Normocephalic, without obvious abnormality, atraumatic. Eyes: Conjunctivae clear, anicteric sclerae. Pupils are equal, conjuctiva injected ENT Nares normal. No drainage  or sinus tenderness. Lips, mucosa, and tongue normal. No Thrush Neck: Supple, symmetrical, no adenopathy, thyroid: non tender no carotid bruit and no JVD. Back: No CVA tenderness. Lungs: b/l air entry- crepts bases Heart: TAchycardia Abdomen: Soft, non-tender,not distended. Bowel sounds normal. No masses Extremities: minimal edema legs Skin: discoid macular pigmentation over the left thigh and rt arm.      Lymph: Cervical, supraclavicular normal. Neurologic: Grossly non-focal Pertinent Labs Lab Results CBC    Component Value Date/Time   WBC 9.0 06/07/2019 0426   RBC 4.76 06/07/2019 0426   HGB 14.1 06/07/2019 0426   HGB 15.2 12/10/2013 1613   HCT 43.5 06/07/2019 0426   HCT 45.3 12/10/2013 1613   PLT 208 06/07/2019 0426   PLT 307 12/10/2013 1613   MCV 91.4 06/07/2019 0426   MCV 89 12/10/2013 1613   MCH 29.6 06/07/2019 0426   MCHC 32.4 06/07/2019 0426   RDW 17.1 (H) 06/07/2019 0426   RDW 13.1 12/10/2013 1613   LYMPHSABS 0.7 06/06/2019 0522   LYMPHSABS 4.1 (H) 12/10/2013 1613   MONOABS 0.4 06/06/2019 0522   MONOABS 0.9 12/10/2013 1613   EOSABS 0.0 06/06/2019 0522   EOSABS 0.5 12/10/2013 1613   BASOSABS 0.0 06/06/2019 0522   BASOSABS 0.1 12/10/2013 1613    CMP Latest Ref Rng & Units 06/07/2019 06/06/2019 06/05/2019  Glucose 70 - 99 mg/dL 155(H) 158(H) 188(H)  BUN 6 - 20 mg/dL 27(H) 21(H) 15  Creatinine 0.44 - 1.00 mg/dL 0.71 0.73 0.80  Sodium 135 - 145 mmol/L 139 139 142  Potassium 3.5 - 5.1 mmol/L 4.8 4.5 4.2  Chloride 98 - 111  mmol/L 96(L) 94(L) 97(L)  CO2 22 - 32 mmol/L 37(H) 32 27  Calcium 8.9 - 10.3 mg/dL 8.6(L) 8.8(L) 8.4(L)  Total Protein 6.5 - 8.1 g/dL - - 6.4(L)  Total Bilirubin 0.3 - 1.2 mg/dL - - 0.6  Alkaline Phos 38 - 126 U/L - - 53  AST 15 - 41 U/L - - 34  ALT 0 - 44 U/L - - 33      Microbiology: Recent Results (from the past 240 hour(s))  Respiratory Panel by RT PCR (Flu A&B, Covid) - Nasopharyngeal Swab     Status: None   Collection Time:  06/04/19  7:07 PM   Specimen: Nasopharyngeal Swab  Result Value Ref Range Status   SARS Coronavirus 2 by RT PCR NEGATIVE NEGATIVE Final    Comment: (NOTE) SARS-CoV-2 target nucleic acids are NOT DETECTED. The SARS-CoV-2 RNA is generally detectable in upper respiratoy specimens during the acute phase of infection. The lowest concentration of SARS-CoV-2 viral copies this assay can detect is 131 copies/mL. A negative result does not preclude SARS-Cov-2 infection and should not be used as the sole basis for treatment or other patient management decisions. A negative result may occur with  improper specimen collection/handling, submission of specimen other than nasopharyngeal swab, presence of viral mutation(s) within the areas targeted by this assay, and inadequate number of viral copies (<131 copies/mL). A negative result must be combined with clinical observations, patient history, and epidemiological information. The expected result is Negative. Fact Sheet for Patients:  PinkCheek.be Fact Sheet for Healthcare Providers:  GravelBags.it This test is not yet ap proved or cleared by the [place] FDA and  has been authorized for detection and/or diagnosis of SARS-CoV-2 by FDA under an Emergency Use Authorization (EUA). This EUA will remain  in effect (meaning this test can be used) for the duration of the COVID-19 declaration under Section 564(b)(1) of the Act, 21 U.S.C. section 360bbb-3(b)(1), unless the authorization is terminated or revoked sooner.    Influenza A by PCR NEGATIVE NEGATIVE Final   Influenza B by PCR NEGATIVE NEGATIVE Final    Comment: (NOTE) The Xpert Xpress SARS-CoV-2/FLU/RSV assay is intended as an aid in  the diagnosis of influenza from Nasopharyngeal swab specimens and  should not be used as a sole basis for treatment. Nasal washings and  aspirates are unacceptable for Xpert Xpress SARS-CoV-2/FLU/RSV    testing. Fact Sheet for Patients: PinkCheek.be Fact Sheet for Healthcare Providers: GravelBags.it This test is not yet approved or cleared by the [place] FDA and  has been authorized for detection and/or diagnosis of SARS-CoV-2 by  FDA under an Emergency Use Authorization (EUA). This EUA will remain  in effect (meaning this test can be used) for the duration of the  Covid-19 declaration under Section 564(b)(1) of the Act, 21  U.S.C. section 360bbb-3(b)(1), unless the authorization is  terminated or revoked. Performed at [facility], [address] [place], [place] [address]   Urine culture     Status: None   Collection Time: 06/04/19  7:10 PM   Specimen: Urine, Clean Catch  Result Value Ref Range Status   Specimen Description   Final    URINE, CLEAN CATCH Performed at [facility], [address] [place], [place] [address]    Special Requests   Final    NONE Performed at [facility], [address] [place], [place] [address]    Culture   Final    NO GROWTH Performed at [facility], [address], [place], [place] [address]    Report Status 06/05/2019 FINAL  Final  Blood Culture (routine x 2)     Status: Abnormal (Preliminary result)   Collection Time: 06/04/19  7:46 PM   Specimen: BLOOD  Result Value Ref Range Status   Specimen Description   Final    BLOOD LEFT ANTECUBITAL Performed at [facility], [address], [place], [place] [address]    Special Requests   Final    BOTTLES DRAWN AEROBIC AND ANAEROBIC Blood Culture adequate volume Performed at [facility], [address] [place], [place] [address]    Culture  Setup Time   Final    GRAM POSITIVE COCCI IN BOTH AEROBIC AND ANAEROBIC BOTTLES CRITICAL RESULT CALLED TO, READ BACK BY AND VERIFIED WITH:  CHRISTINE KATSOUDAS AT [id number] ON 06/05/19 [facility] Performed at [facility], [address], [place], [place] [address]    Culture (A)  Final    VIRIDANS STREPTOCOCCUS CULTURE REINCUBATED FOR BETTER GROWTH Performed at [facility], [place] [address] [place], [place] [address]    Report Status PENDING  Incomplete  Blood Culture (routine x 2)     Status: None (Preliminary result)   Collection Time: 06/04/19  7:46 PM   Specimen: BLOOD  Result Value Ref Range Status   Specimen Description BLOOD RIGHT ANTECUBITAL  Final   Special Requests   Final    BOTTLES DRAWN AEROBIC AND ANAEROBIC Blood Culture adequate volume   Culture   Final    NO GROWTH 2 DAYS Performed at [facility], [address], [place], [place] [address]    Report Status PENDING  Incomplete  MRSA PCR Screening     Status: None   Collection Time: 06/05/19  1:13 AM   Specimen: Nasopharyngeal  Result Value Ref Range Status   MRSA by PCR NEGATIVE NEGATIVE Final    Comment:        The GeneXpert MRSA Assay (FDA approved for NASAL specimens only), is one component of a comprehensive MRSA colonization surveillance program. It is not intended to diagnose MRSA infection nor to guide or monitor treatment for MRSA infections. Performed at [facility], [address] [place], [place] [address]   Culture, respiratory (non-expectorated)     Status: None (Preliminary result)   Collection Time: 06/05/19 12:07 PM   Specimen: Tracheal Aspirate; Respiratory  Result Value Ref Range Status   Specimen Description   Final    TRACHEAL ASPIRATE Performed at [facility], [address] [place], [place] [address]    Special Requests   Final    NONE Performed at [facility], [address] [place], [place] [address]    Gram Stain   Final    ABUNDANT WBC PRESENT, PREDOMINANTLY PMN FEW GRAM POSITIVE COCCI    Culture   Final    RARE Consistent with normal respiratory flora. Performed at [facility], [place] [address] [place], [place] [address]    Report  Status PENDING  Incomplete    IMAGING RESULTS:  I have personally reviewed the films ? Impression/Recommendation ?  Strep viridans bacteremia- 1 of 2 sets -- likely a contaminant as the same bottle also has granulicatella and staph species ?repeat blood cultures have been sent  Encephalopathy likely de to Co2 narcosis- resolved  Respiratory failure- ( likely acute on chronic) s/p extubation  Aspiration pneumoniitis is being questioned- and on unasyn- management as per primary team  Discoid pigmented macules thigh and arm- This is  Fixed drug eruption- drug allergy to NSAIDS and few other drugs - patient need to watch out which drug is triggering it ___________________________________________________ Discussed with patient, requesting provider ID will sign off- call if needed.

## 2019-06-07 NOTE — Progress Notes (Signed)
Patient ID: ZYA SCIANNA, female   DOB: [birth year], 58 y.o.   MRN: [id number]  Patient now hemodynamically stable on 4L nasal cannula and increasing in mobility.  She is stable to move to medsurg tele etry bed with her current care measures.

## 2019-06-07 NOTE — Progress Notes (Signed)
Pharmacy Antibiotic Note  Shelly [name] is a 58 y.o. female admitted on 06/04/2019 with headache. Blood cultures 12/27 initially positive for strep viridans, which could likely represent contamination. However, culture now growing granulicatella adiacens, unclear of significance. ID has been consulted. Pharmacy has been consulted for Unasyn dosing for strep viridans bacteremia.  Plan: Unasyn 3 g IV q6h  Follow up ID recommendations  Height: 5\' 5"  (165.1 cm) Weight: 210 lb 8.6 oz (95.5 kg) IBW/kg (Calculated) : 57  Temp (24hrs), Avg:98.1 F (36.7 C), Min:97.7 F (36.5 C), Max:98.3 F (36.8 C)  Recent Labs  Lab 06/04/19 1910 06/04/19 2110 06/05/19 0452 06/06/19 0522 06/07/19 0426  WBC 16.0*  --  7.4 10.2 9.0  CREATININE 0.82  --  0.80 0.73 0.71  LATICACIDVEN  --  1.8  --   --   --     Estimated Creatinine Clearance: 89.7 mL/min (by C-G formula based on SCr of 0.71 mg/dL).    No Known Allergies  Antimicrobials this admission: Unasyn 12/28 >> 12/29 Unasyn 12/30 >>   Dose adjustments this admission:   Microbiology results: 12/27 BCx: 1 set strep viridans, coag neg staph species, granulicatella adiacens [id number] UCx: no growth  12/28 Sputum: normal flora  12/28 MRSA PCR: negative  Thank you for allowing pharmacy to be a part of this patient's care.  Tawnya Crook, PharmD 06/07/2019 1:49 PM

## 2019-06-07 NOTE — Progress Notes (Signed)
PROGRESS NOTE  Shelly [name] [id number] DOB: [birth year] DOA: 06/04/2019 PCP: [name], NP  Brief History   58 year old woman PMH including oxygen dependent COPD presented with migraine headache, during treatment in the emergency department became hypoxic and rapidly decompensated requiring intubation for acute on chronic hypoxic, hypercapnic respiratory failure secondary to aspiration and possible air embolism due to CTA chest findings a small amount of gas in the right atrium central pulmonary artery, thought to be iatrogenic related to intravenous access  A & P  Acute on chronic hypercapnic, hypoxic respiratory failure secondary to air embolism based on CT chest findings, complicated by left lower lobe aspiration pneumonia, requiring brief mechanical ventilation, subsequently extubated 12/28 and transferred to the hospitalist service 12/30. --Appears to be improving, weaned from 10 to 6 L, but still short of breath and desats with movement.  SPO2 only 90-91% on current treatment.   --Continue antibiotics and bronchodilators  COPD with chronic hypoxic respiratory failure on 3 L --Stable, plan as above, continue prednisone, continue bronchodilators  Streptococcus viridans bacteremia --1/2 bottles.  Presumably related to aspiration.  Will check echocardiogram, and consult infectious disease for further recommendations. --Repeat blood cultures to assess for clearance  Elevated troponin, possibly demand ischemia.  No further evaluation suggested.  Acute encephalopathy thought secondary to hypoxia --Resolved.  No signs or symptoms to suggest stroke.  Neurologic exam is nonfocal.  MRI not pursued.  No further evaluation suggested.  Diabetes mellitus type 2 --CBG has been stable.  Continue present management.  Chronic diastolic CHF --Appears euvolemic.    Lymphedema, no surgical intervention recommended by vascular surgery.  Unna boots recommended  Bilateral leg pain --Etiology  unclear, exam benign.  Monitor.  DVT prophylaxis: enoxaparin Code Status: Full Family Communication: none Disposition Plan: Home with home health PT, OT   Murray Hodgkins, MD  [facility] Direct contact: see [email or web address] (further directions at bottom of note if needed) 7PM-7AM contact night coverage as at bottom of note 06/07/2019, 12:03 PM  LOS: 3 days   Significant Hospital Events   12/27: Pt admitted to ICU with acute on chronic hypoxic hypercapnic respiratory failure requiring mechanical intubation  12/27: CT Head revealed no acute intracranial abnormality. 12/27: CTA Chest revealed negative for acute pulmonary embolus. Small amount of gas in the right atrium and central pulmonary artery as well as in the superficial veins of the upper chest. Finding is likely iatrogenic and related to intravenous access. Consolidated lung in the left base with volume loss, much of which is likely atelectatic however some mixed hypoattenuation of the consolidative lung could suggest underlying infection or aspiration particularly given a patulous, fluid-filled esophagus despite NG placement. Stable bilateral adrenal adenomas. Trace amount of perihepatic ascites, new from prior. Aortic Atherosclerosis 12/28- successfully weaned and liberated from mechanical ventilation 12/29 - patient had been slow to improve but did ok without need of NIV overnight. Plan to optimize medically and downgrade from MICU   Consults:  . Admitted by critical care   Procedures:  .   Significant Diagnostic Tests:  Marland Kitchen    Micro Data:  COVID-19 12/27>>negative  Influenza PCR 12/27>>negative  Blood x2 12/27>> 1/2 Streptococcus viridans Urine 12/27>>   Antimicrobials:  Azithromycin x1 dose 12/27 Ceftriaxone x1 dose 12/27 Unasyn 12/27>>  Interval History/Subjective  Discussed with RN Sarah, no issues overnight except does desat with movement.  Has been weaned from 9 to 6 L this morning.  Patient reports some  vaginal discharge overnight, concern for yeast infection.  Reports pain  in legs.  Objective   Vitals:  Vitals:   06/07/19 1000 06/07/19 1100  BP: 100/80 113/75  Pulse: 71 76  Resp: (!) 22 14  Temp:    SpO2: 92% 92%    Exam:  Constitutional.  Appears calm, comfortable sitting in chair. Respiratory.  Clear to auscultation bilaterally, fair air movement, no wheezes, rales or rhonchi, mild increased respiratory efforts but able to speak clearly.  SPO2 90-91 on 6 L. Cardiovascular.  Regular rate and rhythm.  No murmur, rub or gallop.  No lower extremity edema.  Lower legs and feet are well-perfused. Abdomen.  Soft, nontender, nondistended. Skin.  Bilateral lower extremities appear unremarkable, there is no rash or induration. Musculoskeletal grossly normal tone and strength bilateral lower extremities. Psychiatric.  Grossly normal mood and affect.  Speech fluent and appropriate.  I have personally reviewed the following:   Today's Data  . CBG stable, BMP unremarkable, procalcitonin negative, CBC unremarkable  Scheduled Meds: . [START ON 06/08/2019] aspirin  81 mg Oral Daily  . busPIRone  10 mg Oral BID  . Chlorhexidine Gluconate Cloth  6 each Topical Daily  . clonazePAM  0.5 mg Oral BID  . DULoxetine  30 mg Oral BID  . enoxaparin (LOVENOX) injection  40 mg Subcutaneous Q24H  . famotidine  20 mg Oral BID  . fluconazole  150 mg Oral Once  . fluticasone furoate-vilanterol  1 puff Inhalation Daily  . furosemide  40 mg Oral BID  . insulin aspart  0-15 Units Subcutaneous TID WC  . ipratropium-albuterol  3 mL Nebulization Q4H  . loratadine  10 mg Oral Daily  . mouth rinse  15 mL Mouth Rinse BID  . predniSONE  50 mg Oral Q breakfast   Continuous Infusions:  Principal Problem:   Acute and chronic respiratory failure with hypoxia (HCC) Active Problems:   DM (diabetes mellitus), type 2 (HCC)   Chronic diastolic CHF (congestive heart failure) (HCC)   Chronic respiratory failure  with hypoxia (HCC)   COPD with acute exacerbation (HCC)   Aspiration pneumonia (HCC)   COPD (chronic obstructive pulmonary disease) ([place])   Streptococcal bacteremia   LOS: 3 days   How to contact the [facility] Attending or Consulting provider [place] or covering provider during after hours [place], for this patient?  1. Check the care team in [facility] and look for a) attending/consulting TRH provider listed and b) the [facility] team listed 2. Log into [email or web address] and use [place]'s universal password to access. If you do not have the password, please contact the hospital operator. 3. Locate the [facility] provider you are looking for under [facility] and page to a number that you can be directly reached. 4. If you still have difficulty reaching the provider, please page the [facility] (Director on Call) for the Hospitalists listed on amion for assistance.

## 2019-06-07 NOTE — TOC Initial Note (Signed)
Transition of Care [facility]) - Initial/Assessment Note    Patient Details  Name: Shelly [name] MRN: [id number] Date of Birth: 1960-12-20  [facility] of [facility]) CM/SW Contact:    Candie Chroman, LCSW Phone Number: 06/07/2019, 3:40 PM  Clinical Narrative: CSW met with patient. No supports at bedside. CSW introduced role and explained that PT recommendations would be discussed. Patient stated she is already getting PT, RN, and sometimes OT through [employer]. [facility] representative will look up her information to confirm. Patient is already on oxygen at home. Notified her of 3-in-1 recommendation and she is agreeable. No further concerns. CSW encouraged patient to contact CSW as needed. CSW will continue to follow patient for support and facilitate return home when stable.                 Expected Discharge Plan: [place] Barriers to Discharge: Continued Medical Work up   Patient Goals and CMS Choice     Choice offered to / list presented to : NA  Expected Discharge Plan and Services Expected Discharge Plan: [place] Acute Care Choice: Resumption of Svcs/PTA Provider Living arrangements for the past 2 months: Single Family Home                           HH Arranged: RN, PT, OT [facility] Agency: [place] Date [place]: 06/07/19   Representative spoke with at [place]: Gay Filler (Left message)  Prior Living Arrangements/Services Living arrangements for the past 2 months: [place] Lives with:: Spouse Patient language and need for interpreter reviewed:: Yes Do you feel safe going back to the place where you live?: Yes      Need for Family Participation in Patient Care: Yes (Comment) Care giver support system in place?: Yes (comment) Current home services: Home OT, Home PT, Home RN, DME Criminal Activity/Legal Involvement Pertinent to Current Situation/Hospitalization: No - Comment as  needed  Activities of Daily Living Home Assistive Devices/Equipment: Cane (specify quad or straight), Oxygen, Walker (specify type) ADL Screening (condition at time of admission) Patient's cognitive ability adequate to safely complete daily activities?: Yes Is the patient deaf or have difficulty hearing?: No Does the patient have difficulty seeing, even when wearing glasses/contacts?: No Does the patient have difficulty concentrating, remembering, or making decisions?: No Patient able to express need for assistance with ADLs?: Yes Does the patient have difficulty dressing or bathing?: No Independently performs ADLs?: Yes (appropriate for developmental age) Does the patient have difficulty walking or climbing stairs?: Yes Weakness of Legs: Both Weakness of Arms/Hands: None  Permission Sought/Granted Permission sought to share information with : Facility Art therapist granted to share information with : Yes, Verbal Permission Granted     Permission granted to share info w AGENCY: [facility]        Emotional Assessment Appearance:: Appears stated age Attitude/Demeanor/Rapport: Engaged, Gracious Affect (typically observed): Accepting, Appropriate, Calm, Pleasant Orientation: : Oriented to Self, Oriented to Place, Oriented to  Time, Oriented to Situation Alcohol / Substance Use: Not Applicable Psych Involvement: No (comment)  Admission diagnosis:  Acute respiratory distress [R06.03] COPD with acute exacerbation ([place]) [J44.1] Encounter for orogastric tube placement [Z46.59] Suspected COVID-19 virus infection [Z20.828] Patient Active Problem List   Diagnosis Date Noted  . Acute and chronic respiratory failure with hypoxia ([place]) 06/07/2019  . Aspiration pneumonia ([place]) 06/07/2019  . COPD (chronic  obstructive pulmonary disease) ([place]) 06/07/2019  . Streptococcal bacteremia 06/07/2019  . COPD with acute exacerbation ([place]) 06/04/2019  . Swelling of limb 05/16/2019  .  Chronic diastolic CHF (congestive heart failure) ([place]) 03/07/2019  . Chronic respiratory failure with hypoxia ([place]) 03/07/2019  . Generalized anxiety disorder 03/02/2019  . Acute on chronic respiratory failure with hypoxia and hypercapnia ([place]) 02/27/2019  . Cervical spondylosis 06/20/2018  . Occipital neuralgia of right side 06/20/2018  . Primary osteoarthritis of first carpometacarpal joint of left hand 06/20/2018  . Closed fracture of distal end of radius 03/02/2018  . DM (diabetes mellitus), type 2 ([place]) 09/08/2016  . COPD exacerbation ([place]) 09/07/2016  . Neck pain 04/02/2016  . Abdominal pain 01/04/2014   PCP:  [name], NP Pharmacy:   [facility] [place], [place] - [address] [place] [address] Phone: [phone] Fax: [phone]  [place], [place] - [place] AT [facility] [address] [place] [address] Phone: [phone] Fax: [phone]     Social Determinants of Health ([place]) Interventions    Readmission Risk Interventions No flowsheet data found.

## 2019-06-07 NOTE — Progress Notes (Signed)
Name: Shelly [name] MRN: [id number] DOB: June 05, 1961    ADMISSION DATE:  06/04/2019 CONSULTATION DATE: 12/27  REFERRING MD : Dr. Royden Purl   CHIEF COMPLAINT: Headache   BRIEF PATIENT DESCRIPTION: 58 yo female admitted with headaches, acute encephalopathy, and acute on chronic hypoxic hypercapnic respiratory failure secondary to possible air embolism due to CTA Chest findings of small amount of gas in the right atrium and central pulmonary artery as well as in the superficial veins of the upper chest, finding likely iatrogenic and related to intravenous access and aspiration requiring mechanical intubation   SIGNIFICANT EVENT/STUDIES:  12/27: Pt admitted to ICU with acute on chronic hypoxic hypercapnic respiratory failure requiring mechanical intubation  12/27: CT Head revealed no acute intracranial abnormality. 12/27: CTA Chest revealed negative for acute pulmonary embolus. Small amount of gas in the right atrium and central pulmonary artery as well as in the superficial veins of the upper chest. Finding is likely iatrogenic and related to intravenous access. Consolidated lung in the left base with volume loss, much of which is likely atelectatic however some mixed hypoattenuation of the consolidative lung could suggest underlying infection or aspiration particularly given a patulous, fluid-filled esophagus despite NG placement. Stable bilateral adrenal adenomas. Trace amount of perihepatic ascites, new from prior. Aortic Atherosclerosis 12/28- successfully weaned and liberated from mechanical ventilation 12/29 - patient had been slow to improve but did ok without need of NIV overnight. Plan to optimize medically and downgrade from MICU 12/30 - discussed Trilogy NIV with [facility] to set up for patient due to advanced COPD with recurrent exacerbations and admissions  MICRO DATA: COVID-19 12/27>>negative  Influenza PCR 12/27>>negative  Blood x2 12/27>> Urine 12/27>>  ABX: Azithromycin x1  dose 12/27 Ceftriaxone x1 dose 12/27 Unasyn 12/27>>  HISTORY OF PRESENT ILLNESS:   This is a 58 yo female with a PMH of OSA, HTN, COPD, Headaches, Chronic Home O2 @2 -3LType II Diabetes Mellitus, Asthma, Former Smoker, Atypical Chest Pain, Morbid Obesity, and CHF with preserved LV function (Echo 02/2019 EF 60 to 65%).  She presented to [facility] ER via EMS on 12/27 with c/o headache onset 2 days prior to presentation.  Per ER notes EMS reported pt lethargic and "slow" to answer questions upon their arrival at her home. She wears chronic home O2, and on 3L her O2 sats were 91%.  There was also concern regarding COVID-19 exposure because her grandson tested positive, and she had been around him on Christmas.  Per ER notes on arrival pt initially alert, oriented, and O2 sats were 97% on 3L. She received 500 ml NS bolus, iv zofran, 30 mg iv toradol, and 25 mg iv benadryl.  However, she later became hypoxic with O2 sats 82% on 3L and unresponsive with snoring respirations requiring mechanical intubation.  Lab results revealed K+ 5.4, glucose 162, BNP 2,488, troponin 34, wbc 16.0, and UA negative for UTI.  Post intubation ABG resulted: pH 7.38/pCO2 61/pO2 213/acid-base excess 8.7/bicarb 36.1.  Influenza PCR and COVID-19 negative.  CXR negative for acute abnormalities, but revealed bibasilar scarring.  She received ceftriaxone, azithromycin, and duoneb x1 dose.  CT Head negative. CTA Chest negative for pulmonary embolism, however revealed small amount of gas in the right atrium and central pulmonary artery as well as in the superficial veins of the upper chest, findings likely iatrogenic and related to intravenous access concerning for possible air embolism.  She was subsequently admitted to ICU for additional workup and treatment.    PAST MEDICAL HISTORY :  has a past medical history of Asthma, CHF (congestive heart failure) ([place]), COPD (chronic obstructive pulmonary disease) ([place]), Diabetes mellitus without  complication ([place]), Hypertension, and Sleep apnea.  has a past surgical history that includes Colon surgery and Abdominal hysterectomy. Prior to Admission medications   Medication Sig Start Date End Date Taking? Authorizing Provider  albuterol (VENTOLIN HFA) 108 (90 Base) MCG/ACT inhaler Inhale 2 puffs into the lungs every 6 (six) hours as needed for wheezing or shortness of breath. 03/06/19   Fritzi Mandes, MD  azithromycin [facility]) 250 MG tablet Use as directed 04/26/19   Kendell Bane, NP  baclofen (LIORESAL) 10 MG tablet Take 10 mg by mouth 3 (three) times daily. 01/13/19   [provider]  busPIRone (BUSPAR) 10 MG tablet Take 10 mg by mouth 2 (two) times daily. 04/14/19   [provider]  cetirizine (ZYRTEC) 10 MG tablet cetirizine 10 mg tablet  take 1 tablet by mouth once daily    [provider]  chlorpheniramine-HYDROcodone (TUSSIONEX PENNKINETIC ER) 10-8 MG/5ML SUER Take 5 mLs by mouth at bedtime as needed for cough. 05/01/19   Kendell Bane, NP  clonazePAM (KLONOPIN) 0.5 MG tablet Take 1 tablet (0.5 mg total) by mouth 2 (two) times daily. 04/26/19   Kendell Bane, NP  DULoxetine (CYMBALTA) 30 MG capsule Take 1 capsule (30 mg total) by mouth 2 (two) times daily. 03/03/19   Max Sane, MD  escitalopram (LEXAPRO) 10 MG tablet Take 10 mg by mouth daily. 04/14/19   [provider]  fluticasone furoate-vilanterol (BREO ELLIPTA) 200-25 MCG/INH AEPB Inhale 1 puff into the lungs daily. 09/09/16   Fritzi Mandes, MD  Fluticasone-Salmeterol (ADVAIR DISKUS) 250-50 MCG/DOSE AEPB Inhale 1 puff into the lungs 2 (two) times daily. 03/06/19 03/05/20  Fritzi Mandes, MD  furosemide (LASIX) 40 MG tablet Take 40 mg by mouth 2 (two) times daily. 04/05/19   [provider]  ipratropium-albuterol (DUONEB) 0.5-2.5 (3) MG/3ML SOLN Inhale 3 mLs into the lungs 4 (four) times daily. 03/20/19 03/14/20  [provider]  lisinopril (ZESTRIL) 20 MG tablet Take 20 mg by  mouth daily. 01/04/19   [provider]  naproxen (NAPROSYN) 500 MG tablet Take 500 mg by mouth 2 (two) times daily. 03/21/19   [provider]  predniSONE (DELTASONE) 10 MG tablet Take one tab by mouth daily. 05/01/19   Kendell Bane, NP   No Known Allergies  FAMILY HISTORY:  She was adopted. Family history is unknown by patient. SOCIAL HISTORY:  reports that she has quit smoking. She has never used smokeless tobacco. She reports that she does not drink alcohol or use drugs.  REVIEW OF SYSTEMS:   Unable to assess pt mechanically intubated   SUBJECTIVE:  Unable to assess pt mechanically intubated  VITAL SIGNS: Temp:  [97.7 F (36.5 C)-99.1 F (37.3 C)] 99.1 F (37.3 C) (12/30 1600) Pulse Rate:  [68-297] 109 (12/30 1600) Resp:  [13-29] 20 (12/30 1600) BP: (85-121)/(14-82) 116/64 (12/30 1600) SpO2:  [78 %-100 %] 78 % (12/30 1600) FiO2 (%):  [50 %] 50 % (12/29 2333) Weight:  [95.5 kg] 95.5 kg (12/30 0452)  PHYSICAL EXAMINATION: General: acutely ill appearing female, NAD mechanically intubated  Neuro: sedated, not following commands, PERRL  HEENT: bilateral sclera errythemia, supple, no JVD  Cardiovascular: nsr, rrr, no R/G  Lungs: diminished throughout, even , non labored  Abdomen: +BS x4, soft, non distended, non tender  Musculoskeletal: normal bulk and tone, no edema  Skin: intact no  rashes or lesions present   Recent Labs  Lab 06/05/19 0452 06/06/19 0522 06/07/19 0426  NA 142 139 139  K 4.2 4.5 4.8  CL 97* 94* 96*  CO2 27 32 37*  BUN 15 21* 27*  CREATININE 0.80 0.73 0.71  GLUCOSE 188* 158* 155*   Recent Labs  Lab 06/05/19 0452 06/06/19 0522 06/07/19 0426  HGB 15.3* 14.4 14.1  HCT 48.8* 46.9* 43.5  WBC 7.4 10.2 9.0  PLT 197 219 208   ECHOCARDIOGRAM COMPLETE  Result Date: 06/07/2019   ECHOCARDIOGRAM REPORT   Patient Name:   YENEISY ELLINGBOE [facility] Date of Exam: 06/07/2019 Medical Rec #:  [id number]      Height:       65.0 in Accession #:     [id number]     Weight:       210.5 lb Date of Birth:  April 15, 1961      BSA:          2.02 m Patient Age:    47 years       BP:           113/75 mmHg Patient Gender: F              HR:           76 bpm. Exam Location:  [facility] Procedure: 2D Echo, Cardiac Doppler and Color Doppler Indications:     Bacteremia 790.7  History:         Patient has prior history of Echocardiogram examinations, most                  recent 02/28/2019. CHF, COPD; Risk Factors:Hypertension and                  Diabetes.  Sonographer:     Sherrie Sport RDCS (AE) Referring Phys:  [place] Diagnosing Phys: Kate Sable MD IMPRESSIONS  1. Left ventricular ejection fraction, by visual estimation, is 60 to 65%. The left ventricle has normal function. There is no left ventricular hypertrophy.  2. The left ventricle has no regional wall motion abnormalities.  3. Global right ventricle has normal systolic function.The right ventricular size is normal. No increase in right ventricular wall thickness.  4. Left atrial size was mildly dilated.  5. Right atrial size was normal.  6. The mitral valve is normal in structure. No evidence of mitral valve regurgitation. No evidence of mitral stenosis.  7. The tricuspid valve is normal in structure.  8. The aortic valve is normal in structure. Aortic valve regurgitation is not visualized. No evidence of aortic valve sclerosis or stenosis.  9. The pulmonic valve was normal in structure. Pulmonic valve regurgitation is not visualized. 10. Normal pulmonary artery systolic pressure. 11. The inferior vena cava is normal in size with greater than 50% respiratory variability, suggesting right atrial pressure of 3 mmHg. FINDINGS  Left Ventricle: Left ventricular ejection fraction, by visual estimation, is 60 to 65%. The left ventricle has normal function. The left ventricle has no regional wall motion abnormalities. There is no left ventricular hypertrophy. Left ventricular diastolic parameters were normal.  Normal left atrial pressure. Right Ventricle: The right ventricular size is normal. No increase in right ventricular wall thickness. Global RV systolic function is has normal systolic function. The tricuspid regurgitant velocity is 1.97 m/s, and with an assumed right atrial pressure  of 3 mmHg, the estimated right ventricular systolic pressure is normal at 18.5 mmHg. Left Atrium: Left atrial size was mildly  dilated. Right Atrium: Right atrial size was normal in size Pericardium: There is no evidence of pericardial effusion. Mitral Valve: The mitral valve is normal in structure. No evidence of mitral valve regurgitation. No evidence of mitral valve stenosis by observation. Tricuspid Valve: The tricuspid valve is normal in structure. Tricuspid valve regurgitation is not demonstrated. Aortic Valve: The aortic valve is normal in structure. Aortic valve regurgitation is not visualized. The aortic valve is structurally normal, with no evidence of sclerosis or stenosis. Aortic valve mean gradient measures 5.7 mmHg. Aortic valve peak gradient measures 9.1 mmHg. Aortic valve area, by VTI measures 2.13 cm. Pulmonic Valve: The pulmonic valve was normal in structure. Pulmonic valve regurgitation is not visualized. Pulmonic regurgitation is not visualized. Aorta: The aortic root, ascending aorta and aortic arch are all structurally normal, with no evidence of dilitation or obstruction and the aortic root is normal in size and structure. Venous: The inferior vena cava is normal in size with greater than 50% respiratory variability, suggesting right atrial pressure of 3 mmHg. IAS/Shunts: No atrial level shunt detected by color flow Doppler. There is no evidence of a patent foramen ovale.  LEFT VENTRICLE PLAX 2D LVIDd:         4.56 cm  Diastology LVIDs:         2.75 cm  LV e' lateral:   13.40 cm/s LV PW:         0.81 cm  LV E/e' lateral: 7.2 LV IVS:        0.80 cm  LV e' medial:    9.03 cm/s LVOT diam:     2.00 cm  LV E/e' medial:   10.7 LV SV:         67 ml LV SV Index:   31.46 LVOT Area:     3.14 cm  RIGHT VENTRICLE RV Basal diam:  2.83 cm RV S prime:     16.20 cm/s TAPSE (M-mode): 4.2 cm LEFT ATRIUM             Index       RIGHT ATRIUM           Index LA diam:        4.00 cm 1.98 cm/m  RA Area:     16.90 cm LA Vol (A2C):   50.0 ml 24.73 ml/m RA Volume:   43.50 ml  21.52 ml/m LA Vol (A4C):   76.2 ml 37.69 ml/m LA Biplane Vol: 63.8 ml 31.56 ml/m  AORTIC VALVE                    PULMONIC VALVE AV Area (Vmax):    1.94 cm     PV Vmax:        0.79 m/s AV Area (Vmean):   1.98 cm     PV Peak grad:   2.5 mmHg AV Area (VTI):     2.13 cm     RVOT Peak grad: 5 mmHg AV Vmax:           151.00 cm/s AV Vmean:          105.433 cm/s AV VTI:            0.283 m AV Peak Grad:      9.1 mmHg AV Mean Grad:      5.7 mmHg LVOT Vmax:         93.10 cm/s LVOT Vmean:        66.600 cm/s LVOT VTI:  0.192 m LVOT/AV VTI ratio: 0.68  AORTA Ao Root diam: 2.70 cm MITRAL VALVE                        TRICUSPID VALVE MV Area (PHT): 3.60 cm             TR Peak grad:   15.5 mmHg MV PHT:        61.19 msec           TR Vmax:        197.00 cm/s MV Decel Time: 211 msec MV E velocity: 96.50 cm/s 103 cm/s  SHUNTS MV A velocity: 63.50 cm/s 70.3 cm/s Systemic VTI:  0.19 m MV E/A ratio:  1.52       1.5       Systemic Diam: 2.00 cm  Kate Sable MD Electronically signed by Kate Sable MD Signature Date/Time: 06/07/2019/3:15:49 PM    Final       ASSESSMENT / PLAN:  Acute on chronic hypercapnic hypoxic respiratory failure secondary to possible air embolism based on CTA Chest findings as described above and aspiration  Mechanical intubation  Hx: OSA and Asthma -Extubated yesterday and now imporved post chest physiotherapy Scheduled and prn bronchodilator therapy  -upgrade BPH for LLL atelectasis -procal 0.11 and lactate 1.8 on admission   Hypertension-controlled Mildly elevated troponin likely demand ischemia in setting of acute on chronic  respiratory failure  Hx: HLD and Chronic CHF with preserved LV function  Continuous telemetry monitoring  Will hold outpatient antihypertensives for now  Trend troponin's   Hyperkalemia etiology unknown  Trend BMP  Monitor for EKG changes Monitor UOP   Leukocytosis possibly secondary to aspiration pneumonia  Trend WBC and monitor fever curve  Trend PCT Follow cultures  Will start unasyn   Type II Diabetes Mellitus  CBG's q4hrs  SSI   Acute encephalopathy possibly secondary to hypoxia concerning for possible ischemic CVA  Mechanical intubation pain/discomfort  Acute pain secondary to headaches (CT Head 12/27 negative for acute abnormality.  Pt has a hx of headaches)  Maintain RASS goal -1 to -2  Propofol gtt and prn fentanyl to maintain RASS goal and for pain management  WUA daily Urine drug screen and alcohol level pending  Daily aspirin  MRI Brain pending   BEST PRACTICE: VTE px: subq lovenox  SUP px: iv pepcid  Diet: Will start TF's if pt remains intubated in the next 24hrs  .  Critical care provider statement:    Critical care time (minutes):  32   Critical care time was exclusive of:  Separately billable procedures and  treating other patients   Critical care was necessary to treat or prevent imminent or  life-threatening deterioration of the following conditions:  acute hypoxemic respiratory failure, advanced copd, multiple comorbid conditions.    Critical care was time spent personally by me on the following  activities:  Development of treatment plan with patient or surrogate,  discussions with consultants, evaluation of patient's response to  treatment, examination of patient, obtaining history from patient or  surrogate, ordering and performing treatments and interventions, ordering  and review of laboratory studies and re-evaluation of patient's condition   I assumed direction of critical care for this patient from another  provider in my specialty: no      Ottie Glazier, M.D.  Pulmonary [facility] [place]

## 2019-06-07 NOTE — Progress Notes (Signed)
*  PRELIMINARY RESULTS* Echocardiogram 2D Echocardiogram has been performed.  Sherrie Sport 06/07/2019, 1:30 PM

## 2019-06-08 DIAGNOSIS — J441 Chronic obstructive pulmonary disease with (acute) exacerbation: Secondary | ICD-10-CM

## 2019-06-08 LAB — CULTURE, RESPIRATORY W GRAM STAIN: Culture: NORMAL

## 2019-06-08 LAB — GLUCOSE, CAPILLARY
Glucose-Capillary: 121 mg/dL — ABNORMAL HIGH (ref 70–99)
Glucose-Capillary: 122 mg/dL — ABNORMAL HIGH (ref 70–99)

## 2019-06-08 MED ORDER — IPRATROPIUM-ALBUTEROL 0.5-2.5 (3) MG/3ML IN SOLN
3.0000 mL | Freq: Four times a day (QID) | RESPIRATORY_TRACT | Status: DC
  Filled 2019-06-08: qty 3

## 2019-06-08 MED ORDER — AMOXICILLIN-POT CLAVULANATE 875-125 MG PO TABS: 1.0000 | ORAL_TABLET | Freq: Two times a day (BID) | ORAL | 0 refills | Status: AC

## 2019-06-08 MED ORDER — PREDNISONE 10 MG PO TABS: ORAL_TABLET | ORAL | 0 refills | Status: AC

## 2019-06-08 MED ORDER — INSULIN ASPART 100 UNIT/ML ~~LOC~~ SOLN
0.0000 [IU] | Freq: Three times a day (TID) | SUBCUTANEOUS | Status: DC
  Administered 2019-06-08 (×2): 2 [IU] via SUBCUTANEOUS
  Filled 2019-06-08 (×2): qty 1

## 2019-06-08 NOTE — Discharge Summary (Addendum)
Physician Discharge Summary  Shelly [name] [id number] DOB: [birth year] DOA: 06/04/2019  PCP: [name], NP  Admit date: 06/04/2019 Discharge date: 06/08/2019  Recommendations for Outpatient Follow-up:  1. Follow-up COPD, discharged on NIV.  Follow-up Information    [name], NP.   Specialty: Nurse Practitioner Why: As needed Contact information: [place] [address] [phone]        [place].   Specialty: Emergency Medicine Why: If symptoms worsen Contact information: [place] [id number] [place]       [name], MD. Schedule an appointment as soon as possible for a visit in 1 week.   Specialties: Internal Medicine, Pulmonary Disease Why: PLEASE CALL FOR APPOINTMENT. OFFICE CLOSED FOR HOLIDAY Contact information: [place] [address] [phone]            Discharge Diagnoses: Principal diagnosis is #1 1. Acute on chronic hypercapnic, hypoxic respiratory failure secondary to air embolism based on CT chest findings, complicated by left lower lobe aspiration pneumonia, requiring brief mechanical ventilation, subsequently extubated 12/28 and transferred to the hospitalist service 12/30. 2. Aspiration pneumonia 3. COPD with chronic hypoxic respiratory failure on 3 L 4. Polymicrobial bacteremia 5. Elevated troponin 6. Acute encephalopathy thought secondary to hypoxia 7. Diabetes mellitus type 2 8. Chronic diastolic CHF    Discharge Condition: improved Disposition: home  Diet recommendation: diabetic diet  Filed Weights   06/06/19 0500 06/07/19 0452 06/08/19 0500  Weight: 95.2 kg 95.5 kg 92.6 kg    History of present illness:  58 year old woman PMH including oxygen dependent COPD presented with migraine headache, during treatment in the emergency department became hypoxic and rapidly  decompensated requiring intubation for acute on chronic hypoxic, hypercapnic respiratory failure secondary to aspiration and possible air embolism due to CTA chest findings a small amount of gas in the right atrium central pulmonary artery, thought to be iatrogenic related to intravenous access.    Hospital Course:  Patient was admitted to the ICU by critical care and treated for hypoxic and hypercapnic respiratory failure as well as aspiration pneumonia.  She gradually improved and was successfully extubated, but because of severity of hypercapnia, recurrent hospitalizations, COPD, pulmonology recommended discharge home on NIV.  Her condition appears to be at baseline this point to complete a course of oral antibiotics in the outpatient setting.  She will follow-up with her pulmonologist in the near future.  Individual issues as below.  Acute on chronic hypercapnic, hypoxic respiratory failure secondary to air embolism based on CT chest findings, complicated by left lower lobe aspiration pneumonia, requiring brief mechanical ventilation, subsequently extubated 12/28 and transferred to the hospitalist service 12/30. --Successfully weaned down to baseline oxygen.  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  Seen by infectious disease.  Elevated troponin, possibly demand ischemia.  No further evaluation suggested.  Acute encephalopathy thought secondary to hypoxia --Resolved.  No signs or symptoms to suggest stroke.  Neurologic exam is nonfocal.  MRI not pursued.  No further evaluation suggested.  Diabetes mellitus type 2 --CBG has been stable.  Continue present management.  Chronic diastolic CHF --Appears euvolemic.    Today's assessment: S: Feels better, breathing better, congestion is loosening up.  Some  cough. O: Vitals:  Vitals:   06/08/19 [id number] 06/08/19 [id number]  BP: 118/81   Pulse: 68   Resp: 16   Temp: 97.9 F (36.6 C)   SpO2: 92% (!) 89%    Constitutional:  . Appears calm and comfortable Respiratory:  . CTA bilaterally, no w/r/r.  . Respiratory effort normal.  Cardiovascular:  . RRR, no m/r/g Psychiatric:  . Mental status o Mood, affect appropriate  CBG stable Discharge Instructions  Discharge Instructions    Diet - low sodium heart healthy   Complete by: As directed    Diet Carb Modified   Complete by: As directed    Discharge instructions   Complete by: As directed    Call your physician or seek immediate medical attention for shortness of breath, confusion, pain or worsening of condition.   Increase activity slowly   Complete by: As directed      Allergies as of 06/08/2019   No Known Allergies     Medication List    STOP taking these medications   azithromycin 250 MG tablet Commonly known as: ZITHROMAX   cetirizine 10 MG tablet Commonly known as: ZYRTEC   chlorpheniramine-HYDROcodone 10-8 MG/5ML Suer Commonly known as: Tussionex Pennkinetic ER   escitalopram 10 MG tablet Commonly known as: LEXAPRO   Fluticasone-Salmeterol 250-50 MCG/DOSE Aepb Commonly known as: Advair Diskus   naproxen 500 MG tablet Commonly known as: NAPROSYN     TAKE these medications   albuterol 108 (90 Base) MCG/ACT inhaler Commonly known as: VENTOLIN HFA Inhale 2 puffs into the lungs every 6 (six) hours as needed for wheezing or shortness of breath.   amoxicillin-clavulanate 875-125 MG tablet Commonly known as: Augmentin Take 1 tablet by mouth 2 (two) times daily for 5 days.   baclofen 10 MG tablet Commonly known as: LIORESAL Take 10 mg by mouth 3 (three) times daily.   busPIRone 10 MG tablet Commonly known as: BUSPAR Take 10 mg by mouth 2 (two) times daily.   clonazePAM 0.5 MG tablet Commonly known as: KLONOPIN Take 1 tablet (0.5 mg total) by mouth 2 (two)  times daily.   DULoxetine 30 MG capsule Commonly known as: CYMBALTA Take 1 capsule (30 mg total) by mouth 2 (two) times daily.   fluticasone furoate-vilanterol 200-25 MCG/INH Aepb Commonly known as: BREO ELLIPTA Inhale 1 puff into the lungs daily.   furosemide 40 MG tablet Commonly known as: LASIX Take 40 mg by mouth 2 (two) times daily.   ipratropium-albuterol 0.5-2.5 (3) MG/3ML Soln Commonly known as: DUONEB Inhale 3 mLs into the lungs 4 (four) times daily.   lisinopril 20 MG tablet Commonly known as: ZESTRIL Take 20 mg by mouth daily.   predniSONE 10 MG tablet Commonly known as: DELTASONE Take 4 tablets (40 mg total) by mouth daily with breakfast for 3 days, THEN 2 tablets (20 mg total) daily with breakfast for 3 days, THEN 1 tablet (10 mg total) daily with breakfast for 3 days. Start taking on: June 08, 2019 What changed: See the new instructions.      No Known Allergies  The results of significant diagnostics from this hospitalization (including imaging, microbiology, ancillary and laboratory) are listed below for reference.    Significant Diagnostic Studies: DG Abd 1 View  Result Date: 06/05/2019 CLINICAL DATA:  OG tube placement EXAM: ABDOMEN - 1 VIEW COMPARISON:  Radiograph 05/01/2011, CT a chest 05/05/2019 FINDINGS: A transesophageal tube tip and side port terminate in the region of the gastric body, beyond the GE junction. Pacer pads overlie the chest. Additional support devices including catheter tubing and  telemetry leads overlie the upper abdomen. Bowel gas pattern is unremarkable. Persisting consolidation partially obscuring the left hemidiaphragm. Additional atelectatic changes in the right lung base. IMPRESSION: 1. A transesophageal tube tip and side port terminate in the region of the gastric body, beyond the GE junction. 2. Persisting consolidation partially obscuring the left hemidiaphragm. Electronically Signed   By: Lovena Le M.D.   On: 06/05/2019  00:40   CT Head Wo Contrast  Result Date: 06/04/2019 CLINICAL DATA:  Acute hypoxic respiratory failure, altered mental status EXAM: CT HEAD WITHOUT CONTRAST TECHNIQUE: Contiguous axial images were obtained from the base of the skull through the vertex without intravenous contrast. COMPARISON:  CT head 02/27/2019 FINDINGS: Brain: No evidence of acute infarction, hemorrhage, hydrocephalus, extra-axial collection or mass lesion/mass effect. Vascular: Atherosclerotic calcification of the carotid siphons. No hyperdense vessel. Skull: No calvarial fracture or suspicious osseous lesion. No scalp swelling or hematoma. Sinuses/Orbits: Paranasal sinuses and mastoid air cells are predominantly clear. Included orbital structures are unremarkable. Other: None IMPRESSION: No acute intracranial abnormality. Electronically Signed   By: Lovena Le M.D.   On: 06/04/2019 22:59   CT Angio Chest PE W/Cm &/Or Wo Cm  Result Date: 06/04/2019 CLINICAL DATA:  Acute hypoxic respiratory failure, negative COVID-19 testing EXAM: CT ANGIOGRAPHY CHEST WITH CONTRAST TECHNIQUE: Multidetector CT imaging of the chest was performed using the standard protocol during bolus administration of intravenous contrast. Multiplanar CT image reconstructions and MIPs were obtained to evaluate the vascular anatomy. CONTRAST:  72mL OMNIPAQUE IOHEXOL 350 MG/ML SOLN COMPARISON:  Chest CTA 04/20/2019 FINDINGS: Cardiovascular: Satisfactory opacification the pulmonary arteries to the segmental level. No pulmonary artery filling defects are identified. Central pulmonary arteries remain normal caliber. Small amount of gas in the right atrium and central pulmonary artery as well as in the superficial veins of the upper chest. Finding is likely iatrogenic and related to intravenous access. Cardiac size at the upper limits of normal. No pericardial effusion. Coronary artery calcifications are minimal. Atherosclerotic plaque within the normal caliber aorta. Normal  3 vessel branching of the aortic arch. Minimal calcifications in the proximal great vessels. Mediastinum/Nodes: Thyroid and thoracic inlet is unremarkable. No mediastinal, hilar or axillary adenopathy. The thoracic esophagus is patulous and fluid-filled. A transesophageal tube terminates below the level of imaging with the side port beyond the GE junction. Patient is intubated at this time with the endotracheal tip approximately 3.4 cm from the carina. Lungs/Pleura: There are several bandlike areas of opacity similar to comparison likely reflective of scarring/architectural distortion. Some dependent consolidated lung in the left base with volume loss, much of which is likely atelectatic however some mixed hypoattenuation of the consolidative lung could suggest underlying infection or aspiration. Upper Abdomen: Trace amount of perihepatic ascites. Stable appearance of the bilateral adrenal adenomas. Musculoskeletal: No acute osseous abnormality or suspicious osseous lesion. Mild exaggerated thoracic kyphosis with multilevel discogenic change. Review of the MIP images confirms the above findings. IMPRESSION: 1. Negative for acute pulmonary embolus. 2. Small amount of gas in the right atrium and central pulmonary artery as well as in the superficial veins of the upper chest. Finding is likely iatrogenic and related to intravenous access. 3. Consolidated lung in the left base with volume loss, much of which is likely atelectatic however some mixed hypoattenuation of the consolidative lung could suggest underlying infection or aspiration particularly given a patulous, fluid-filled esophagus despite NG placement. 4. Stable bilateral adrenal adenomas. 5. Trace amount of perihepatic ascites, new from prior. 6. Aortic Atherosclerosis (ICD10-I70.0). Electronically  Signed   By: Lovena Le M.D.   On: 06/04/2019 22:57   DG Chest Portable 1 View  Result Date: 06/04/2019 CLINICAL DATA:  Headache, respiratory failure,  became unresponsive, family positive for COVID-19; past history asthma, CHF, COPD, diabetes mellitus, hypertension EXAM: PORTABLE CHEST 1 VIEW COMPARISON:  Portable exam 1903 hours compared to 04/20/2019 FINDINGS: Tip of endotracheal tube projects 5.6 cm above carina. External pacing leads project over chest. Normal heart size and mediastinal contours. Atherosclerotic calcification aorta. Chronic accentuation of bibasilar markings. No definite acute infiltrate, pleural effusion or pneumothorax. IMPRESSION: Bibasilar scarring without acute abnormalities. Electronically Signed   By: Lavonia Dana M.D.   On: 06/04/2019 19:32   ECHOCARDIOGRAM COMPLETE  Result Date: 06/07/2019   ECHOCARDIOGRAM REPORT   Patient Name:   LAYLAA KNECHTEL [facility] Date of Exam: 06/07/2019 Medical Rec #:  [id number]      Height:       65.0 in Accession #:    [id number]     Weight:       210.5 lb Date of Birth:  Mar 06, 1961      BSA:          2.02 m Patient Age:    62 years       BP:           113/75 mmHg Patient Gender: F              HR:           76 bpm. Exam Location:  [facility] Procedure: 2D Echo, Cardiac Doppler and Color Doppler Indications:     Bacteremia 790.7  History:         Patient has prior history of Echocardiogram examinations, most                  recent 02/28/2019. CHF, COPD; Risk Factors:Hypertension and                  Diabetes.  Sonographer:     Sherrie Sport RDCS (AE) Referring Phys:  [place] Diagnosing Phys: Kate Sable MD IMPRESSIONS  1. Left ventricular ejection fraction, by visual estimation, is 60 to 65%. The left ventricle has normal function. There is no left ventricular hypertrophy.  2. The left ventricle has no regional wall motion abnormalities.  3. Global right ventricle has normal systolic function.The right ventricular size is normal. No increase in right ventricular wall thickness.  4. Left atrial size was mildly dilated.  5. Right atrial size was normal.  6. The mitral valve is normal in structure. No  evidence of mitral valve regurgitation. No evidence of mitral stenosis.  7. The tricuspid valve is normal in structure.  8. The aortic valve is normal in structure. Aortic valve regurgitation is not visualized. No evidence of aortic valve sclerosis or stenosis.  9. The pulmonic valve was normal in structure. Pulmonic valve regurgitation is not visualized. 10. Normal pulmonary artery systolic pressure. 11. The inferior vena cava is normal in size with greater than 50% respiratory variability, suggesting right atrial pressure of 3 mmHg. FINDINGS  Left Ventricle: Left ventricular ejection fraction, by visual estimation, is 60 to 65%. The left ventricle has normal function. The left ventricle has no regional wall motion abnormalities. There is no left ventricular hypertrophy. Left ventricular diastolic parameters were normal. Normal left atrial pressure. Right Ventricle: The right ventricular size is normal. No increase in right ventricular wall thickness. Global RV systolic function is has normal systolic function. The tricuspid regurgitant  velocity is 1.97 m/s, and with an assumed right atrial pressure  of 3 mmHg, the estimated right ventricular systolic pressure is normal at 18.5 mmHg. Left Atrium: Left atrial size was mildly dilated. Right Atrium: Right atrial size was normal in size Pericardium: There is no evidence of pericardial effusion. Mitral Valve: The mitral valve is normal in structure. No evidence of mitral valve regurgitation. No evidence of mitral valve stenosis by observation. Tricuspid Valve: The tricuspid valve is normal in structure. Tricuspid valve regurgitation is not demonstrated. Aortic Valve: The aortic valve is normal in structure. Aortic valve regurgitation is not visualized. The aortic valve is structurally normal, with no evidence of sclerosis or stenosis. Aortic valve mean gradient measures 5.7 mmHg. Aortic valve peak gradient measures 9.1 mmHg. Aortic valve area, by VTI measures 2.13 cm.  Pulmonic Valve: The pulmonic valve was normal in structure. Pulmonic valve regurgitation is not visualized. Pulmonic regurgitation is not visualized. Aorta: The aortic root, ascending aorta and aortic arch are all structurally normal, with no evidence of dilitation or obstruction and the aortic root is normal in size and structure. Venous: The inferior vena cava is normal in size with greater than 50% respiratory variability, suggesting right atrial pressure of 3 mmHg. IAS/Shunts: No atrial level shunt detected by color flow Doppler. There is no evidence of a patent foramen ovale.  LEFT VENTRICLE PLAX 2D LVIDd:         4.56 cm  Diastology LVIDs:         2.75 cm  LV e' lateral:   13.40 cm/s LV PW:         0.81 cm  LV E/e' lateral: 7.2 LV IVS:        0.80 cm  LV e' medial:    9.03 cm/s LVOT diam:     2.00 cm  LV E/e' medial:  10.7 LV SV:         67 ml LV SV Index:   31.46 LVOT Area:     3.14 cm  RIGHT VENTRICLE RV Basal diam:  2.83 cm RV S prime:     16.20 cm/s TAPSE (M-mode): 4.2 cm LEFT ATRIUM             Index       RIGHT ATRIUM           Index LA diam:        4.00 cm 1.98 cm/m  RA Area:     16.90 cm LA Vol (A2C):   50.0 ml 24.73 ml/m RA Volume:   43.50 ml  21.52 ml/m LA Vol (A4C):   76.2 ml 37.69 ml/m LA Biplane Vol: 63.8 ml 31.56 ml/m  AORTIC VALVE                    PULMONIC VALVE AV Area (Vmax):    1.94 cm     PV Vmax:        0.79 m/s AV Area (Vmean):   1.98 cm     PV Peak grad:   2.5 mmHg AV Area (VTI):     2.13 cm     RVOT Peak grad: 5 mmHg AV Vmax:           151.00 cm/s AV Vmean:          105.433 cm/s AV VTI:            0.283 m AV Peak Grad:      9.1 mmHg AV Mean Grad:  5.7 mmHg LVOT Vmax:         93.10 cm/s LVOT Vmean:        66.600 cm/s LVOT VTI:          0.192 m LVOT/AV VTI ratio: 0.68  AORTA Ao Root diam: 2.70 cm MITRAL VALVE                        TRICUSPID VALVE MV Area (PHT): 3.60 cm             TR Peak grad:   15.5 mmHg MV PHT:        61.19 msec           TR Vmax:        197.00 cm/s MV  Decel Time: 211 msec MV E velocity: 96.50 cm/s 103 cm/s  SHUNTS MV A velocity: 63.50 cm/s 70.3 cm/s Systemic VTI:  0.19 m MV E/A ratio:  1.52       1.5       Systemic Diam: 2.00 cm  Kate Sable MD Electronically signed by Kate Sable MD Signature Date/Time: 06/07/2019/3:15:49 PM    Final    Reflux (Bilateral)  Result Date: 05/30/2019  Lower Venous Reflux Study Indications: Pain, and Swelling.  Performing Technologist: Almira Coaster RVS  Examination Guidelines: A complete evaluation includes B-mode imaging, spectral Doppler, color Doppler, and power Doppler as needed of all accessible portions of each vessel. Bilateral testing is considered an integral part of a complete examination. Limited examinations for reoccurring indications may be performed as noted. The reflux portion of the exam is performed with the patient in reverse Trendelenburg.  +---------+---------------+---------+-----------+----------+--------------+ RIGHT    CompressibilityPhasicitySpontaneityPropertiesThrombus Aging +---------+---------------+---------+-----------+----------+--------------+ CFV      Full           Yes      Yes                                 +---------+---------------+---------+-----------+----------+--------------+ SFJ      Full           Yes      Yes                                 +---------+---------------+---------+-----------+----------+--------------+ FV Prox  Full           Yes      Yes                                 +---------+---------------+---------+-----------+----------+--------------+ FV Mid   Full           Yes      Yes                                 +---------+---------------+---------+-----------+----------+--------------+ FV DistalFull           Yes      Yes                                 +---------+---------------+---------+-----------+----------+--------------+ PFV      Full           Yes      Yes                                  +---------+---------------+---------+-----------+----------+--------------+  POP      Full           Yes      Yes                                 +---------+---------------+---------+-----------+----------+--------------+ GSV      Full           Yes      Yes                                 +---------+---------------+---------+-----------+----------+--------------+ SSV      Full           Yes      Yes                                 +---------+---------------+---------+-----------+----------+--------------+   +---------+---------------+---------+-----------+----------+--------------+ LEFT     CompressibilityPhasicitySpontaneityPropertiesThrombus Aging +---------+---------------+---------+-----------+----------+--------------+ CFV      Full           Yes      Yes                                 +---------+---------------+---------+-----------+----------+--------------+ SFJ      Full           Yes      Yes                                 +---------+---------------+---------+-----------+----------+--------------+ FV Prox  Full           Yes      Yes                                 +---------+---------------+---------+-----------+----------+--------------+ FV Mid   Full           Yes      Yes                                 +---------+---------------+---------+-----------+----------+--------------+ FV DistalFull           Yes      Yes                                 +---------+---------------+---------+-----------+----------+--------------+ PFV      Full           Yes      Yes                                 +---------+---------------+---------+-----------+----------+--------------+ POP      Full           Yes      Yes                                 +---------+---------------+---------+-----------+----------+--------------+ GSV      Full           Yes      Yes                                  +---------+---------------+---------+-----------+----------+--------------+  SSV      Full           Yes      Yes                                 +---------+---------------+---------+-----------+----------+--------------+   Venous Reflux Times +--------------+------+-----------+------------+--------+ RIGHT         RefluxReflux TimeDiameter cmsComments                Yes                                  +--------------+------+-----------+------------+--------+ GSV at SFJ                         1.07             +--------------+------+-----------+------------+--------+ GSV prox thigh                     .92              +--------------+------+-----------+------------+--------+ GSV mid thigh                      .61              +--------------+------+-----------+------------+--------+ GSV dist thigh                     .51              +--------------+------+-----------+------------+--------+ GSV at knee                        .58              +--------------+------+-----------+------------+--------+ GSV prox calf  yes    1049 ms      .48              +--------------+------+-----------+------------+--------+ SSV Pop Fossa                      .31              +--------------+------+-----------+------------+--------+  +--------------+------+-----------+------------+--------+ LEFT          RefluxReflux TimeDiameter cmsComments                Yes                                  +--------------+------+-----------+------------+--------+ GSV at SFJ                         .88              +--------------+------+-----------+------------+--------+ GSV prox thigh                     .91              +--------------+------+-----------+------------+--------+ GSV mid thigh                      .49              +--------------+------+-----------+------------+--------+ GSV dist thigh                     .45               +--------------+------+-----------+------------+--------+  GSV at knee                        .37              +--------------+------+-----------+------------+--------+ GSV prox calf                      .37              +--------------+------+-----------+------------+--------+ SSV Pop Fossa                      .35              +--------------+------+-----------+------------+--------+   Summary: Right: Evidence of chronic venous insufficiency is detected in the great saphenous vein. There is no evidence of deep vein thrombosis in the lower extremity.There is no evidence of superficial venous thrombosis. Left: There is no evidence of deep vein thrombosis in the lower extremity. There is no evidence of superficial venous thrombosis.There is no evidence of chronic venous insufficiency.  *See table(s) above for measurements and observations. Electronically signed by Leotis Pain MD on 05/30/2019 at 8:44:17 AM.    Final     Microbiology: Recent Results (from the past 240 hour(s))  Respiratory Panel by RT PCR (Flu A&B, Covid) - Nasopharyngeal Swab     Status: None   Collection Time: 06/04/19  7:07 PM   Specimen: Nasopharyngeal Swab  Result Value Ref Range Status   SARS Coronavirus 2 by RT PCR NEGATIVE NEGATIVE Final    Comment: (NOTE) SARS-CoV-2 target nucleic acids are NOT DETECTED. The SARS-CoV-2 RNA is generally detectable in upper respiratoy specimens during the acute phase of infection. The lowest concentration of SARS-CoV-2 viral copies this assay can detect is 131 copies/mL. A negative result does not preclude SARS-Cov-2 infection and should not be used as the sole basis for treatment or other patient management decisions. A negative result may occur with  improper specimen collection/handling, submission of specimen other than nasopharyngeal swab, presence of viral mutation(s) within the areas targeted by this assay, and inadequate number of viral copies (<131 copies/mL). A  negative result must be combined with clinical observations, patient history, and epidemiological information. The expected result is Negative. Fact Sheet for Patients:  PinkCheek.be Fact Sheet for Healthcare Providers:  GravelBags.it This test is not yet ap proved or cleared by the [place] FDA and  has been authorized for detection and/or diagnosis of SARS-CoV-2 by FDA under an Emergency Use Authorization (EUA). This EUA will remain  in effect (meaning this test can be used) for the duration of the COVID-19 declaration under Section 564(b)(1) of the Act, 21 U.S.C. section 360bbb-3(b)(1), unless the authorization is terminated or revoked sooner.    Influenza A by PCR NEGATIVE NEGATIVE Final   Influenza B by PCR NEGATIVE NEGATIVE Final    Comment: (NOTE) The Xpert Xpress SARS-CoV-2/FLU/RSV assay is intended as an aid in  the diagnosis of influenza from Nasopharyngeal swab specimens and  should not be used as a sole basis for treatment. Nasal washings and  aspirates are unacceptable for Xpert Xpress SARS-CoV-2/FLU/RSV  testing. Fact Sheet for Patients: PinkCheek.be Fact Sheet for Healthcare Providers: GravelBags.it This test is not yet approved or cleared by the [place] FDA and  has been authorized for detection and/or diagnosis of SARS-CoV-2 by  FDA under an Emergency Use Authorization (EUA). This EUA will remain  in effect (meaning this test can be used) for the duration  of the  Covid-19 declaration under Section 564(b)(1) of the Act, 21  U.S.C. section 360bbb-3(b)(1), unless the authorization is  terminated or revoked. Performed at [facility], [address] [place], [place] [address]   Urine culture     Status: None   Collection Time: 06/04/19  7:10 PM   Specimen: Urine, Clean Catch  Result Value Ref Range Status   Specimen Description    Final    URINE, CLEAN CATCH Performed at [facility], [address] [place], [place] [address]    Special Requests   Final    NONE Performed at [facility], [address], [place], [place] [address]    Culture   Final    NO GROWTH Performed at [facility], [place] [address], [place], [place] [address]    Report Status 06/05/2019 FINAL  Final  Blood Culture (routine x 2)     Status: Abnormal   Collection Time: 06/04/19  7:46 PM   Specimen: BLOOD  Result Value Ref Range Status   Specimen Description   Final    BLOOD LEFT ANTECUBITAL Performed at [facility], [address] [place], [place] [address]    Special Requests   Final    BOTTLES DRAWN AEROBIC AND ANAEROBIC Blood Culture adequate volume Performed at [facility], [address], [place], [place] [address]    Culture  Setup Time   Final    GRAM POSITIVE COCCI IN BOTH AEROBIC AND ANAEROBIC BOTTLES CRITICAL RESULT CALLED TO, READ BACK BY AND VERIFIED WITH:  CHRISTINE KATSOUDAS AT [id number] ON 06/05/19 [facility] Performed at [facility], [address] [place], [place] [address]    Culture (A)  Final    VIRIDANS STREPTOCOCCUS STAPHYLOCOCCUS SPECIES (COAGULASE NEGATIVE) GRANULICATELLA ADIACENS ORGANISMS 1 AND 2 THE SIGNIFICANCE OF ISOLATING THIS ORGANISM FROM A SINGLE SET OF BLOOD CULTURES WHEN MULTIPLE SETS ARE DRAWN IS UNCERTAIN. PLEASE NOTIFY THE MICROBIOLOGY DEPARTMENT WITHIN ONE WEEK IF SPECIATION AND SENSITIVITIES ARE REQUIRED. ORGANISM 3 Standardized susceptibility testing for this organism is not available. Performed at [facility], [place] [address], [place], [place] [address]    Report Status 06/07/2019 FINAL  Final  Blood Culture (routine x 2)     Status: None (Preliminary result)   Collection Time: 06/04/19  7:46 PM   Specimen: BLOOD  Result Value Ref Range Status   Specimen Description BLOOD RIGHT ANTECUBITAL  Final   Special Requests   Final     BOTTLES DRAWN AEROBIC AND ANAEROBIC Blood Culture adequate volume   Culture   Final    NO GROWTH 4 DAYS Performed at [facility], [address] [place], [place] [address]    Report Status PENDING  Incomplete  MRSA PCR Screening     Status: None   Collection Time: 06/05/19  1:13 AM   Specimen: Nasopharyngeal  Result Value Ref Range Status   MRSA by PCR NEGATIVE NEGATIVE Final    Comment:        The GeneXpert MRSA Assay (FDA approved for NASAL specimens only), is one component of a comprehensive MRSA colonization surveillance program. It is not intended to diagnose MRSA infection nor to guide or monitor treatment for MRSA infections. Performed at [facility], [address] [place],  [address]   Culture, respiratory (non-expectorated)     Status: None   Collection Time: 06/05/19 12:07 PM   Specimen: Tracheal Aspirate; Respiratory  Result Value Ref Range Status  Specimen Description   Final    TRACHEAL ASPIRATE Performed at [facility], [address] [place], [place] [address]    Special Requests   Final    NONE Performed at [facility], [address] [place], [place] [address]    Gram Stain   Final    ABUNDANT WBC PRESENT, PREDOMINANTLY PMN FEW GRAM POSITIVE COCCI    Culture   Final    RARE Consistent with normal respiratory flora. Performed at [facility], [place] [address] [place], [place] [address]    Report Status 06/08/2019 FINAL  Final  Culture, blood (routine x 2)     Status: None (Preliminary result)   Collection Time: 06/07/19 12:23 PM   Specimen: BLOOD  Result Value Ref Range Status   Specimen Description BLOOD LEFT ANTECUBITAL  Final   Special Requests   Final    BOTTLES DRAWN AEROBIC AND ANAEROBIC Blood Culture adequate volume   Culture   Final    NO GROWTH < 24 HOURS Performed at [facility], [address] [place], [place] [address]    Report Status PENDING  Incomplete    Culture, blood (routine x 2)     Status: None (Preliminary result)   Collection Time: 06/07/19 12:29 PM   Specimen: BLOOD  Result Value Ref Range Status   Specimen Description BLOOD BLOOD LEFT HAND  Final   Special Requests   Final    BOTTLES DRAWN AEROBIC AND ANAEROBIC Blood Culture adequate volume   Culture   Final    NO GROWTH < 24 HOURS Performed at [facility], [address] [place], [place] [address]    Report Status PENDING  Incomplete     Labs: Basic Metabolic Panel: Recent Labs  Lab 06/04/19 1910 06/05/19 0452 06/06/19 0522 06/07/19 0426  NA 139 142 139 139  K 5.4* 4.2 4.5 4.8  CL 94* 97* 94* 96*  CO2 36* 27 32 37*  GLUCOSE 162* 188* 158* 155*  BUN 16 15 21* 27*  CREATININE 0.82 0.80 0.73 0.71  CALCIUM 9.2 8.4* 8.8* 8.6*  MG  --  2.6*  --   --   PHOS  --  2.5  --   --    Liver Function Tests: Recent Labs  Lab 06/04/19 1910 06/05/19 0452  AST 35 34  ALT 36 33  ALKPHOS 62 53  BILITOT 0.6 0.6  PROT 7.5 6.4*  ALBUMIN 4.1 3.4*   CBC: Recent Labs  Lab 06/04/19 1910 06/05/19 0452 06/06/19 0522 06/07/19 0426  WBC 16.0* 7.4 10.2 9.0  NEUTROABS 9.3*  --  9.1*  --   HGB 16.8* 15.3* 14.4 14.1  HCT 54.5* 48.8* 46.9* 43.5  MCV 94.6 93.3 95.7 91.4  PLT 217 197 219 208    Recent Labs    04/06/19 1624 04/20/19 1535 06/04/19 1910  BNP 48.0 222.0* 2,488.0*    CBG: Recent Labs  Lab 06/07/19 1214 06/07/19 1530 06/07/19 1932 06/08/19 0734 06/08/19 1134  GLUCAP 153* 234* 172* 121* 122*    Principal Problem:   Acute and chronic respiratory failure with hypoxia (HCC) Active Problems:   DM (diabetes mellitus), type 2 (HCC)   Chronic diastolic CHF (congestive heart failure) (HCC)   Chronic respiratory failure with hypoxia (HCC)   COPD with acute exacerbation (HCC)   Aspiration pneumonia ([place])   COPD (chronic obstructive pulmonary disease) ([place])   Streptococcal bacteremia   Time coordinating discharge: 35  minutes  Signed:  Murray Hodgkins,  MD  [facility]  06/08/2019, 7:20 PM

## 2019-06-08 NOTE — Progress Notes (Signed)
Physical Therapy Treatment Patient Details Name: Shelly [name] MRN: [id number] DOB: [birth year] Today's Date: 06/08/2019    History of Present Illness 58 y/o female patient came in with headache, while being worked up for that developed increasing shortness of breath. Intubated 12/27 and extubated 12/28 . Found to have air embolism on chest CT. PMH includes: OSA, HTN, COPD, home O2, DM, obesity, CHF.    PT Comments    Patient received in bed, reports she needs to use bathroom, but agreeable to walk in hallway. Very motivated to improve. Patient performs bed mobility with use of rails and increased time. No physical assist. Transfers sit to stand with supervision. Ambulated 180 feet with RW and min guard 4 lpm O2. Sats down to 88% after walking. She will continue to benefit from skilled PT while here to improve strength and activity tolerance.     Follow Up Recommendations  Home health PT     Equipment Recommendations  Other (comment)(shower chair)    Recommendations for Other Services       Precautions / Restrictions Precautions Precautions: Fall Precaution Comments: mod fall Restrictions Weight Bearing Restrictions: No    Mobility  Bed Mobility Overal bed mobility: Modified Independent             General bed mobility comments: use of bed rails, increased time  Transfers Overall transfer level: Modified independent Equipment used: Rolling walker (2 wheeled) Transfers: Sit to/from Stand Sit to Stand: Min guard         General transfer comment: safe with transfers, min guard/supervision  Ambulation/Gait Ambulation/Gait assistance: Min guard Gait Distance (Feet): 180 Feet Assistive device: Rolling walker (2 wheeled) Gait Pattern/deviations: Step-through pattern;Decreased stride length Gait velocity: decreased   General Gait Details: slow, steady pace, assist to manage O2 tank, LE painful with ambulation   Stairs             Wheelchair Mobility     Modified Rankin (Stroke Patients Only)       Balance Overall balance assessment: Modified Independent Sitting-balance support: Feet supported Sitting balance-Leahy Scale: Normal     Standing balance support: Bilateral upper extremity supported;During functional activity Standing balance-Leahy Scale: Good Standing balance comment: reliant on RW                            Cognition Arousal/Alertness: Awake/alert Behavior During Therapy: WFL for tasks assessed/performed Overall Cognitive Status: Within Functional Limits for tasks assessed                                 General Comments: discussing family stressors during session. May benefit from chaplain visit.      Exercises Other Exercises Other Exercises: B LE strengthening: AP, hip abd/add, SLR x 10 reps each    General Comments        Pertinent Vitals/Pain Pain Assessment: Faces Faces Pain Scale: Hurts whole lot Pain Location: B lower legs with standing and to touch Pain Descriptors / Indicators: Tender;Discomfort Pain Intervention(s): Limited activity within patient's tolerance;Monitored during session    Home Living                      Prior Function            PT Goals (current goals can now be found in the care plan section) Acute Rehab PT Goals Patient Stated Goal: to return home,  feel better PT Goal Formulation: With patient Time For Goal Achievement: 06-Jul-2019 Potential to Achieve Goals: Good Progress towards PT goals: Progressing toward goals    Frequency    Min 2X/week      PT Plan Current plan remains appropriate    Co-evaluation              AM-PAC PT "6 Clicks" Mobility   Outcome Measure  Help needed turning from your back to your side while in a flat bed without using bedrails?: None Help needed moving from lying on your back to sitting on the side of a flat bed without using bedrails?: None Help needed moving to and from a bed to a  chair (including a wheelchair)?: A Little Help needed standing up from a chair using your arms (e.g., wheelchair or bedside chair)?: A Little Help needed to walk in hospital room?: A Little Help needed climbing 3-5 steps with a railing? : A Lot 6 Click Score: 19    End of Session Equipment Utilized During Treatment: Gait belt;Oxygen Activity Tolerance: Patient tolerated treatment well Patient left: in chair;with call bell/phone within reach Nurse Communication: Mobility status PT Visit Diagnosis: Pain;Difficulty in walking, not elsewhere classified (R26.2) Pain - Right/Left: (B Legs and feet) Pain - part of body: Leg;Ankle and joints of foot     Time: [id number] PT Time Calculation (min) (ACUTE ONLY): 35 min  Charges:  $Gait Training: 8-22 mins $Therapeutic Exercise: 8-22 mins                     Albaro Deviney, PT, GCS 06/08/19,10:00 AM

## 2019-06-08 NOTE — Progress Notes (Deleted)
PROGRESS NOTE  Shelly [name] [id number] DOB: 11-25-1960 DOA: 06/04/2019 PCP: [name], NP  Brief History   58 year old woman PMH including oxygen dependent COPD presented with migraine headache, during treatment in the emergency department became hypoxic and rapidly decompensated requiring intubation for acute on chronic hypoxic, hypercapnic respiratory failure secondary to aspiration and possible air embolism due to CTA chest findings a small amount of gas in the right atrium central pulmonary artery, thought to be iatrogenic related to intravenous access.  She was also treated for aspiration pneumonia, remained hypercapnic, ventilator and even when extubated used BiPAP at night.  A & P  Acute on chronic hypercapnic, hypoxic respiratory failure secondary to air embolism based on CT chest findings, complicated by left lower lobe aspiration pneumonia, requiring brief mechanical ventilation, subsequently extubated 12/28 and transferred to the hospitalist service 12/30. --Appears to be improving, weaned from 10 to 6 L, but still short of breath and desats with movement.  SPO2 only 90-91% on current treatment.   --Continue antibiotics and bronchodilators  COPD with chronic hypoxic respiratory failure on 3 L --Stable, plan as above, continue prednisone, continue bronchodilators  Streptococcus viridans bacteremia --1/2 bottles.  Presumably related to aspiration.  Will check echocardiogram, and consult infectious disease for further recommendations. --Repeat blood cultures to assess for clearance  Elevated troponin, possibly demand ischemia.  No further evaluation suggested.  Acute encephalopathy thought secondary to hypoxia --Resolved.  No signs or symptoms to suggest stroke.  Neurologic exam is nonfocal.  MRI not pursued.  No further evaluation suggested.  Diabetes mellitus type 2 --CBG has been stable.  Continue present management.  Chronic diastolic CHF --Appears euvolemic.     Lymphedema, no surgical intervention recommended by vascular surgery.  Unna boots recommended  Bilateral leg pain --Etiology unclear, exam benign.  Monitor.  DVT prophylaxis: enoxaparin Code Status: Full Family Communication: none Disposition Plan: Home with home health PT, OT   Murray Hodgkins, MD  [facility] Direct contact: see [email or web address] (further directions at bottom of note if needed) 7PM-7AM contact night coverage as at bottom of note 06/08/2019, 12:18 PM  LOS: 4 days   Significant Hospital Events   12/27: Pt admitted to ICU with acute on chronic hypoxic hypercapnic respiratory failure requiring mechanical intubation  12/28- successfully weaned and liberated from mechanical ventilation 12/29 - patient had been slow to improve but did ok without need of NIV overnight. Plan to optimize medically and downgrade from MICU 12/30 - Dr. Lanney Gins discussed Trilogy NIV with [employer] [facility] to set up for patient due to advanced COPD with recurrent exacerbations and admissions   Consults:  . Admitted by critical care . Infectious disease   Procedures:    Significant Diagnostic Tests:  12/27: CT Head revealed no acute intracranial abnormality. 12/27: CTA Chest revealed negative for acute pulmonary embolus. Small amount of gas in the right atrium and central pulmonary artery as well as in the superficial veins of the upper chest. Finding is likely iatrogenic and related to intravenous access. Consolidated lung in the left base with volume loss, much of which is likely atelectatic however some mixed hypoattenuation of the consolidative lung could suggest underlying infection or aspiration particularly given a patulous, fluid-filled esophagus despite NG placement. Stable bilateral adrenal adenomas. Trace amount of perihepatic ascites, new from prior. Aortic Atherosclerosis  2D echocardiogram LVEF 60 to 65%.  Normal left ventricular function.  Normal systolic function right  ventricle.   Micro Data:  COVID-19 12/27>>negative  Influenza PCR 12/27>>negative  Blood x2  12/27>> 1/2 Streptococcus viridans, staph species, GRANULICATELLA ADIACENS.  Contaminant. Urine 12/27>> no growth Blood culture 12/30 no growth Respiratory culture: Normal respiratory flora   Antimicrobials:  Azithromycin x1 dose 12/27 Ceftriaxone x1 dose 12/27 Unasyn 12/27>>  Interval History/Subjective  Discussed with RN Sarah, no issues overnight except does desat with movement.  Has been weaned from 67 to 6 L this morning.  Patient reports some vaginal discharge overnight, concern for yeast infection.  Reports pain in legs.  Objective   Vitals:  Vitals:   06/08/19 0735 06/08/19 0951  BP: 118/81   Pulse: 68   Resp: 16   Temp: 97.9 F (36.6 C)   SpO2: 92% (!) 89%    Exam:  Constitutional.  Appears calm, comfortable sitting in chair. Respiratory.  Clear to auscultation bilaterally, fair air movement, no wheezes, rales or rhonchi, mild increased respiratory efforts but able to speak clearly.  SPO2 90-91 on 6 L. Cardiovascular.  Regular rate and rhythm.  No murmur, rub or gallop.  No lower extremity edema.  Lower legs and feet are well-perfused. Abdomen.  Soft, nontender, nondistended. Skin.  Bilateral lower extremities appear unremarkable, there is no rash or induration. Musculoskeletal grossly normal tone and strength bilateral lower extremities. Psychiatric.  Grossly normal mood and affect.  Speech fluent and appropriate.  I have personally reviewed the following:   Today's Data  . CBG stable, BMP unremarkable, procalcitonin negative, CBC unremarkable  Scheduled Meds: . aspirin  81 mg Oral Daily  . busPIRone  10 mg Oral BID  . clonazePAM  0.5 mg Oral BID  . DULoxetine  30 mg Oral BID  . enoxaparin (LOVENOX) injection  40 mg Subcutaneous Q24H  . famotidine  20 mg Oral BID  . fluticasone furoate-vilanterol  1 puff Inhalation Daily  . furosemide  40 mg Oral BID  . insulin  aspart  0-15 Units Subcutaneous TID AC & HS  . ipratropium-albuterol  3 mL Nebulization Q4H  . loratadine  10 mg Oral Daily  . mouth rinse  15 mL Mouth Rinse BID  . predniSONE  50 mg Oral Q breakfast   Continuous Infusions: . ampicillin-sulbactam (UNASYN) IV 3 g (06/08/19 [id number])    Principal Problem:   Acute and chronic respiratory failure with hypoxia (HCC) Active Problems:   DM (diabetes mellitus), type 2 (HCC)   Chronic diastolic CHF (congestive heart failure) (HCC)   Chronic respiratory failure with hypoxia (HCC)   COPD with acute exacerbation (HCC)   Aspiration pneumonia (HCC)   COPD (chronic obstructive pulmonary disease) ([place])   Streptococcal bacteremia   LOS: 4 days   How to contact the [facility] Attending or Consulting provider [place] or covering provider during after hours [place], for this patient?  1. Check the care team in [facility] and look for a) attending/consulting [facility] provider listed and b) the [facility] team listed 2. Log into [email or web address] and use [place]'s universal password to access. If you do not have the password, please contact the hospital operator. 3. Locate the [facility] provider you are looking for under [facility] and page to a number that you can be directly reached. 4. If you still have difficulty reaching the provider, please page the [facility] (Director on Call) for the Hospitalists listed on amion for assistance.

## 2019-06-08 NOTE — Progress Notes (Signed)
Patient continues to exhibit signs of hypercapnia associated with chronic respiratory failure secondary to severe COPD. Patient requires the use of NIV both QHS and daytime to help with exacerbation periods. The use of the NIV will treat patient's high PCO2 levels and can reduce risk of exacerbations and future hospitalizations when used at night and during the day. Pt will need these advanced settings in conjunction with her current medication regimen; BIPAP is not an option due to its functional limitations and the severity of the patient's condition. Failure to have NIV available for use over a 24 hour period could lead to death. Patient able to maintain their own airway and clear their own secretions.

## 2019-06-08 NOTE — Progress Notes (Signed)
Patient discharged home per MD order. All discharge instructions given and all questions answered. 

## 2019-06-08 NOTE — Progress Notes (Deleted)
Pt continues to exhibit signs of Hypoxia associated with Chronic Respiratory Failure secondary to Severe COPD. Patient requires the use of NIV both QHS and daytime to help with exacerbation periods. The use of the NIV will treat patient's Low PFT (FEV1/FVC) Levels and can reduce risk of exacerbations and future hospitalizations when used at night and during the day. Pt will need these advanced settings in conjunction with her current medication regimen; BIPAP is not an option due to its functional limitations and the severity of the patient's condition. Failure to have NIV available for use over a 24- hour period could lead to death. Patient is able to clear and maintain their own airway.

## 2019-06-08 NOTE — Progress Notes (Signed)
Pharmacy Antibiotic Note  Shelly [name] is a 58 y.o. female admitted on 06/04/2019 with headache. Blood cultures 12/27 initially positive for strep viridans. Culture now growing granulicatella adiacens. ID has been consulted and believes this most likely represents a contaminant. Repeat Bcx have been ordered. Pharmacy has been consulted for Unasyn dosing for strep viridans bacteremia.  Plan: Continue Unasyn 3 g IV q6h  Follow up ID recommendations  Height: 5\' 5"  (165.1 cm) Weight: 204 lb 2.3 oz (92.6 kg) IBW/kg (Calculated) : 57  Temp (24hrs), Avg:98.2 F (36.8 C), Min:97.5 F (36.4 C), Max:99.1 F (37.3 C)  Recent Labs  Lab 06/04/19 1910 06/04/19 2110 06/05/19 0452 06/06/19 0522 06/07/19 0426  WBC 16.0*  --  7.4 10.2 9.0  CREATININE 0.82  --  0.80 0.73 0.71  LATICACIDVEN  --  1.8  --   --   --     Estimated Creatinine Clearance: 88.3 mL/min (by C-G formula based on SCr of 0.71 mg/dL).    No Known Allergies  Antimicrobials this admission: Unasyn 12/28 >> 12/29 Unasyn 12/30 >>   Microbiology results: 12/27 BCx: 1 set strep viridans, coag neg staph species, granulicatella adiacens [id number] UCx: no growth  12/28 Sputum: normal flora  12/28 MRSA PCR: negative  12/30 Bcx: pending   Thank you for allowing pharmacy to be a part of this patient's care.  Pernell Dupre, PharmD, BCPS Clinical Pharmacist 06/08/2019 9:23 AM

## 2019-06-09 LAB — CULTURE, BLOOD (ROUTINE X 2)
Culture: NO GROWTH
Special Requests: ADEQUATE

## 2019-06-12 LAB — CULTURE, BLOOD (ROUTINE X 2)
Culture: NO GROWTH
Culture: NO GROWTH
Special Requests: ADEQUATE
Special Requests: ADEQUATE

## 2019-06-14 ENCOUNTER — Telehealth: Payer: Self-pay

## 2019-06-14 NOTE — Telephone Encounter (Signed)
Tried calling pt to move up appt and discuss niv vest and hospital follow up and this can be done over the phone. Unable to reach pt after several tried the vmail box is full. Beth

## 2019-06-15 ENCOUNTER — Other Ambulatory Visit: Payer: Self-pay | Admitting: Adult Health

## 2019-06-15 DIAGNOSIS — F419 Anxiety disorder, unspecified: Secondary | ICD-10-CM

## 2019-06-15 MED ORDER — CLONAZEPAM 0.5 MG PO TABS: 0.5000 mg | ORAL_TABLET | Freq: Two times a day (BID) | ORAL | 0 refills | Status: DC

## 2019-06-15 NOTE — Progress Notes (Signed)
Sent a few clonazepam to pharamcy, to get patient to next appt.

## 2019-06-16 ENCOUNTER — Ambulatory Visit: Payer: Medicare Other | Admitting: Adult Health

## 2019-06-19 ENCOUNTER — Other Ambulatory Visit: Payer: Self-pay

## 2019-06-19 ENCOUNTER — Ambulatory Visit
Admission: RE | Admit: 2019-06-19 | Discharge: 2019-06-19 | Disposition: A | Discharge status: Home / Self Care | Payer: Medicare Other | Source: Ambulatory Visit | Attending: Internal Medicine | Admitting: Internal Medicine

## 2019-06-19 ENCOUNTER — Encounter: Payer: Self-pay | Admitting: Internal Medicine

## 2019-06-19 ENCOUNTER — Ambulatory Visit
Admission: RE | Admit: 2019-06-19 | Discharge: 2019-06-19 | Disposition: A | Discharge status: Home / Self Care | Payer: Medicare Other | Source: Home / Self Care | Attending: Internal Medicine | Admitting: Internal Medicine

## 2019-06-19 ENCOUNTER — Ambulatory Visit: Payer: Medicare Other | Admitting: Internal Medicine

## 2019-06-19 VITALS — BP 145/90 | HR 108 | Temp 96.9°F | Resp 18 | Ht 65.0 in | Wt 212.0 lb

## 2019-06-19 DIAGNOSIS — J9690 Respiratory failure, unspecified, unspecified whether with hypoxia or hypercapnia: Secondary | ICD-10-CM | POA: Diagnosis not present

## 2019-06-19 DIAGNOSIS — F419 Anxiety disorder, unspecified: Secondary | ICD-10-CM

## 2019-06-19 DIAGNOSIS — Z9981 Dependence on supplemental oxygen: Secondary | ICD-10-CM | POA: Diagnosis not present

## 2019-06-19 DIAGNOSIS — R05 Cough: Secondary | ICD-10-CM

## 2019-06-19 DIAGNOSIS — I509 Heart failure, unspecified: Secondary | ICD-10-CM

## 2019-06-19 DIAGNOSIS — J9611 Chronic respiratory failure with hypoxia: Secondary | ICD-10-CM | POA: Diagnosis not present

## 2019-06-19 DIAGNOSIS — J181 Lobar pneumonia, unspecified organism: Secondary | ICD-10-CM

## 2019-06-19 DIAGNOSIS — J9612 Chronic respiratory failure with hypercapnia: Secondary | ICD-10-CM

## 2019-06-19 DIAGNOSIS — I214 Non-ST elevation (NSTEMI) myocardial infarction: Secondary | ICD-10-CM | POA: Diagnosis not present

## 2019-06-19 DIAGNOSIS — R059 Cough, unspecified: Secondary | ICD-10-CM

## 2019-06-19 MED ORDER — CLONAZEPAM 0.5 MG PO TABS: 0.5000 mg | ORAL_TABLET | Freq: Two times a day (BID) | ORAL | 2 refills | Status: AC

## 2019-06-19 NOTE — Progress Notes (Signed)
[facility] [place], [place] [address]  Pulmonary Sleep Medicine   Office Visit Note  Patient Name: Shelly [name]  DOB: 10-16-1960 MRN [id number]  Date of Service: 06/19/2019  Complaints/HPI: Respiratory failure, COPD ?NIV  Patient has been having multiple events with respiratory failure requiring admission to the hospital.  She was recently admitted to the hospital for respiratory failure ended up intubated on the ventilator.  She states that she had a pneumonia at that time based on the notes reviewed from the hospital she was extubated on December 28 and then was transferred to the hospitalist service.  She was discharged home with the plan of getting noninvasive ventilator set up for her.  Today she states she is feeling about her baseline.  She is visibly short of breath.  She drops her saturations whenever she exerts herself but then quickly recovers.  Denies having any chest pain no palpitations at this time.  She does understand that she is near end-of-life.  ROS  General: (-) fever, (-) chills, (-) night sweats, (-) weakness Skin: (-) rashes, (-) itching,. Eyes: (-) visual changes, (-) redness, (-) itching. Nose and Sinuses: (-) nasal stuffiness or itchiness, (-) postnasal drip, (-) nosebleeds, (-) sinus trouble. Mouth and Throat: (-) sore throat, (-) hoarseness. Neck: (-) swollen glands, (-) enlarged thyroid, (-) neck pain. Respiratory: + cough, (-) bloody sputum, + shortness of breath, + wheezing. Cardiovascular: + ankle swelling, (-) chest pain. Lymphatic: (-) lymph node enlargement. Neurologic: (-) numbness, (-) tingling. Psychiatric: (-) anxiety, (-) depression   Current Medication: Outpatient Encounter Medications as of 06/19/2019  Medication Sig  . albuterol (VENTOLIN HFA) 108 (90 Base) MCG/ACT inhaler Inhale 2 puffs into the lungs every 6 (six) hours as needed for wheezing or shortness of breath.  . baclofen (LIORESAL) 10 MG tablet Take  10 mg by mouth 3 (three) times daily.  . busPIRone (BUSPAR) 10 MG tablet Take 10 mg by mouth 2 (two) times daily.  . clonazePAM (KLONOPIN) 0.5 MG tablet Take 1 tablet (0.5 mg total) by mouth 2 (two) times daily.  . DULoxetine (CYMBALTA) 30 MG capsule Take 1 capsule (30 mg total) by mouth 2 (two) times daily.  . fluticasone furoate-vilanterol (BREO ELLIPTA) 200-25 MCG/INH AEPB Inhale 1 puff into the lungs daily.  . furosemide (LASIX) 40 MG tablet Take 40 mg by mouth 2 (two) times daily.  Marland Kitchen ipratropium-albuterol (DUONEB) 0.5-2.5 (3) MG/3ML SOLN Inhale 3 mLs into the lungs 4 (four) times daily.  Marland Kitchen lisinopril (ZESTRIL) 20 MG tablet Take 20 mg by mouth daily.   No facility-administered encounter medications on file as of 06/19/2019.    Surgical History: Past Surgical History:  Procedure Laterality Date  . ABDOMINAL HYSTERECTOMY    . COLON SURGERY      Medical History: Past Medical History:  Diagnosis Date  . Asthma   . CHF (congestive heart failure) ([place])   . COPD (chronic obstructive pulmonary disease) ([place])   . Diabetes mellitus without complication ([place])   . Hypertension   . Sleep apnea     Family History: Family History  Adopted: Yes  Family history unknown: Yes    Social History: Social History   Socioeconomic History  . Marital status: Married    Spouse name: Not on file  . Number of children: Not on file  . Years of education: Not on file  . Highest education level: Not on file  Occupational History  . Not on file  Tobacco Use  .  Smoking status: Former Research scientist (life sciences)  . Smokeless tobacco: Never Used  Substance and Sexual Activity  . Alcohol use: No  . Drug use: No  . Sexual activity: Not on file  Other Topics Concern  . Not on file  Social History Narrative  . Not on file   Social Determinants of Health   Financial Resource Strain:   . Difficulty of Paying Living Expenses: Not on file  Food Insecurity:   . Worried About Charity fundraiser in the Last Year:  Not on file  . Ran Out of Food in the Last Year: Not on file  Transportation Needs:   . Lack of Transportation (Medical): Not on file  . Lack of Transportation (Non-Medical): Not on file  Physical Activity:   . Days of Exercise per Week: Not on file  . Minutes of Exercise per Session: Not on file  Stress:   . Feeling of Stress : Not on file  Social Connections:   . Frequency of Communication with Friends and Family: Not on file  . Frequency of Social Gatherings with Friends and Family: Not on file  . Attends Religious Services: Not on file  . Active Member of Clubs or Organizations: Not on file  . Attends Archivist Meetings: Not on file  . Marital Status: Not on file  Intimate Partner Violence:   . Fear of Current or Ex-Partner: Not on file  . Emotionally Abused: Not on file  . Physically Abused: Not on file  . Sexually Abused: Not on file    Vital Signs: Blood pressure (!) 145/90, pulse (!) 108, temperature (!) 96.9 F (36.1 C), resp. rate 18, height 5\' 5"  (1.651 m), weight 212 lb (96.2 kg).  Examination: General Appearance: The patient is well-developed, well-nourished, and in no distress. Skin: Gross inspection of skin unremarkable. Head: normocephalic, no gross deformities. Eyes: no gross deformities noted. ENT: ears appear grossly normal no exudates. Neck: Supple. No thyromegaly. No LAD. Respiratory: few rhonchi noted at this time. Cardiovascular: Normal S1 and S2 without murmur or rub. Extremities: No cyanosis. pulses are equal. Neurologic: Alert and oriented. No involuntary movements.  LABS: Recent Results (from the past 2160 hour(s))  Basic metabolic panel     Status: Abnormal   Collection Time: 04/06/19  4:24 PM  Result Value Ref Range   Sodium 138 135 - 145 mmol/L   Potassium 3.7 3.5 - 5.1 mmol/L   Chloride 92 (L) 98 - 111 mmol/L   CO2 32 22 - 32 mmol/L   Glucose, Bld 235 (H) 70 - 99 mg/dL   BUN 10 6 - 20 mg/dL   Creatinine, Ser 0.89 0.44 - 1.00  mg/dL   Calcium 9.4 8.9 - 10.3 mg/dL   GFR calc non Af Amer >60 >60 mL/min   GFR calc Af Amer >60 >60 mL/min   Anion gap 14 5 - 15    Comment: Performed at [facility], [address] [place], [place] [address]  CBC     Status: Abnormal   Collection Time: 04/06/19  4:24 PM  Result Value Ref Range   WBC 9.2 4.0 - 10.5 K/uL   RBC 5.52 (H) 3.87 - 5.11 MIL/uL   Hemoglobin 16.0 (H) 12.0 - 15.0 g/dL   HCT 49.2 (H) 36.0 - 46.0 %   MCV 89.1 80.0 - 100.0 fL   MCH 29.0 26.0 - 34.0 pg   MCHC 32.5 30.0 - 36.0 g/dL   RDW 13.6 11.5 - 15.5 %  Platelets 196 150 - 400 K/uL   nRBC 0.0 0.0 - 0.2 %    Comment: Performed at [facility], [address] [place], [place] [address]  Brain natriuretic peptide     Status: None   Collection Time: 04/06/19  4:24 PM  Result Value Ref Range   B Natriuretic Peptide 48.0 0.0 - 100.0 pg/mL    Comment: Performed at [facility], [address] [place], [place] [address]  Comprehensive metabolic panel     Status: Abnormal   Collection Time: 04/20/19  3:35 PM  Result Value Ref Range   Sodium 142 135 - 145 mmol/L   Potassium 4.6 3.5 - 5.1 mmol/L   Chloride 99 98 - 111 mmol/L   CO2 33 (H) 22 - 32 mmol/L   Glucose, Bld 94 70 - 99 mg/dL   BUN 13 6 - 20 mg/dL   Creatinine, Ser 0.80 0.44 - 1.00 mg/dL   Calcium 9.8 8.9 - 10.3 mg/dL   Total Protein 6.7 6.5 - 8.1 g/dL   Albumin 3.7 3.5 - 5.0 g/dL   AST 16 15 - 41 U/L   ALT 16 0 - 44 U/L   Alkaline Phosphatase 57 38 - 126 U/L   Total Bilirubin 0.5 0.3 - 1.2 mg/dL   GFR calc non Af Amer >60 >60 mL/min   GFR calc Af Amer >60 >60 mL/min   Anion gap 10 5 - 15    Comment: Performed at [facility], [address] [place], [place] [address]  CBC with Differential     Status: Abnormal   Collection Time: 04/20/19  3:35 PM  Result Value Ref Range   WBC 9.6 4.0 - 10.5 K/uL   RBC 5.39 (H) 3.87 - 5.11 MIL/uL   Hemoglobin 15.4 (H) 12.0 - 15.0 g/dL   HCT 49.2 (H) 36.0 - 46.0 %    MCV 91.3 80.0 - 100.0 fL   MCH 28.6 26.0 - 34.0 pg   MCHC 31.3 30.0 - 36.0 g/dL   RDW 14.1 11.5 - 15.5 %   Platelets 211 150 - 400 K/uL   nRBC 0.0 0.0 - 0.2 %   Neutrophils Relative % 56 %   Neutro Abs 5.5 1.7 - 7.7 K/uL   Lymphocytes Relative 30 %   Lymphs Abs 2.8 0.7 - 4.0 K/uL   Monocytes Relative 8 %   Monocytes Absolute 0.8 0.1 - 1.0 K/uL   Eosinophils Relative 5 %   Eosinophils Absolute 0.5 0.0 - 0.5 K/uL   Basophils Relative 1 %   Basophils Absolute 0.1 0.0 - 0.1 K/uL   Immature Granulocytes 0 %   Abs Immature Granulocytes 0.02 0.00 - 0.07 K/uL    Comment: Performed at [facility], [address] [place], [place] [address]  Brain natriuretic peptide     Status: Abnormal   Collection Time: 04/20/19  3:35 PM  Result Value Ref Range   B Natriuretic Peptide 222.0 (H) 0.0 - 100.0 pg/mL    Comment: Performed at [facility], [place], [place] [address]  Troponin I (High Sensitivity)     Status: None   Collection Time: 04/20/19  3:35 PM  Result Value Ref Range   Troponin I (High Sensitivity) 5 <18 ng/L    Comment: (NOTE) Elevated high sensitivity troponin I (hsTnI) values and significant  changes across serial measurements may suggest ACS but many other  chronic and acute conditions are known to elevate hsTnI results.  Refer to  the "Links" section for chest pain algorithms and additional  guidance. Performed at [facility], [address] [place], [place] [address]   Respiratory Panel by RT PCR (Flu A&B, Covid) - Nasopharyngeal Swab     Status: None   Collection Time: 06/04/19  7:07 PM   Specimen: Nasopharyngeal Swab  Result Value Ref Range   SARS Coronavirus 2 by RT PCR NEGATIVE NEGATIVE    Comment: (NOTE) SARS-CoV-2 target nucleic acids are NOT DETECTED. The SARS-CoV-2 RNA is generally detectable in upper respiratoy specimens during the acute phase of infection. The lowest concentration of SARS-CoV-2 viral copies this  assay can detect is 131 copies/mL. A negative result does not preclude SARS-Cov-2 infection and should not be used as the sole basis for treatment or other patient management decisions. A negative result may occur with  improper specimen collection/handling, submission of specimen other than nasopharyngeal swab, presence of viral mutation(s) within the areas targeted by this assay, and inadequate number of viral copies (<131 copies/mL). A negative result must be combined with clinical observations, patient history, and epidemiological information. The expected result is Negative. Fact Sheet for Patients:  PinkCheek.be Fact Sheet for Healthcare Providers:  GravelBags.it This test is not yet ap proved or cleared by the [place] FDA and  has been authorized for detection and/or diagnosis of SARS-CoV-2 by FDA under an Emergency Use Authorization (EUA). This EUA will remain  in effect (meaning this test can be used) for the duration of the COVID-19 declaration under Section 564(b)(1) of the Act, 21 U.S.C. section 360bbb-3(b)(1), unless the authorization is terminated or revoked sooner.    Influenza A by PCR NEGATIVE NEGATIVE   Influenza B by PCR NEGATIVE NEGATIVE    Comment: (NOTE) The Xpert Xpress SARS-CoV-2/FLU/RSV assay is intended as an aid in  the diagnosis of influenza from Nasopharyngeal swab specimens and  should not be used as a sole basis for treatment. Nasal washings and  aspirates are unacceptable for Xpert Xpress SARS-CoV-2/FLU/RSV  testing. Fact Sheet for Patients: PinkCheek.be Fact Sheet for Healthcare Providers: GravelBags.it This test is not yet approved or cleared by the [place] FDA and  has been authorized for detection and/or diagnosis of SARS-CoV-2 by  FDA under an Emergency Use Authorization (EUA). This EUA will remain  in effect (meaning  this test can be used) for the duration of the  Covid-19 declaration under Section 564(b)(1) of the Act, 21  U.S.C. section 360bbb-3(b)(1), unless the authorization is  terminated or revoked. Performed at [facility], [address] [place], [place] [address]   Comprehensive metabolic panel     Status: Abnormal   Collection Time: 06/04/19  7:10 PM  Result Value Ref Range   Sodium 139 135 - 145 mmol/L   Potassium 5.4 (H) 3.5 - 5.1 mmol/L   Chloride 94 (L) 98 - 111 mmol/L   CO2 36 (H) 22 - 32 mmol/L   Glucose, Bld 162 (H) 70 - 99 mg/dL   BUN 16 6 - 20 mg/dL   Creatinine, Ser 0.82 0.44 - 1.00 mg/dL   Calcium 9.2 8.9 - 10.3 mg/dL   Total Protein 7.5 6.5 - 8.1 g/dL   Albumin 4.1 3.5 - 5.0 g/dL   AST 35 15 - 41 U/L   ALT 36 0 - 44 U/L   Alkaline Phosphatase 62 38 - 126 U/L   Total Bilirubin 0.6 0.3 - 1.2 mg/dL   GFR calc non Af Amer >60 >60 mL/min   GFR calc Af  Amer >60 >60 mL/min   Anion gap 9 5 - 15    Comment: Performed at [facility], [address] [place], [place] [address]  Brain natriuretic peptide     Status: Abnormal   Collection Time: 06/04/19  7:10 PM  Result Value Ref Range   B Natriuretic Peptide 2,488.0 (H) 0.0 - 100.0 pg/mL    Comment: Performed at [facility], [place], [place] [address]  Troponin I (High Sensitivity)     Status: Abnormal   Collection Time: 06/04/19  7:10 PM  Result Value Ref Range   Troponin I (High Sensitivity) 34 (H) <18 ng/L    Comment: (NOTE) Elevated high sensitivity troponin I (hsTnI) values and significant  changes across serial measurements may suggest ACS but many other  chronic and acute conditions are known to elevate hsTnI results.  Refer to the "Links" section for chest pain algorithms and additional  guidance. Performed at [facility], [address] [place], [place] [address]   CBC with Differential     Status: Abnormal   Collection Time: 06/04/19  7:10 PM  Result  Value Ref Range   WBC 16.0 (H) 4.0 - 10.5 K/uL   RBC 5.76 (H) 3.87 - 5.11 MIL/uL   Hemoglobin 16.8 (H) 12.0 - 15.0 g/dL   HCT 54.5 (H) 36.0 - 46.0 %   MCV 94.6 80.0 - 100.0 fL   MCH 29.2 26.0 - 34.0 pg   MCHC 30.8 30.0 - 36.0 g/dL   RDW 16.5 (H) 11.5 - 15.5 %   Platelets 217 150 - 400 K/uL   nRBC 0.7 (H) 0.0 - 0.2 %   Neutrophils Relative % 58 %   Neutro Abs 9.3 (H) 1.7 - 7.7 K/uL   Lymphocytes Relative 31 %   Lymphs Abs 5.0 (H) 0.7 - 4.0 K/uL   Monocytes Relative 7 %   Monocytes Absolute 1.1 (H) 0.1 - 1.0 K/uL   Eosinophils Relative 0 %   Eosinophils Absolute 0.0 0.0 - 0.5 K/uL   Basophils Relative 0 %   Basophils Absolute 0.0 0.0 - 0.1 K/uL   Other 4 %   Abs Immature Granulocytes 0.00 0.00 - 0.07 K/uL   Polychromasia MARKED     Comment: Performed at [facility], [address] [place], [place] [address]  Urinalysis, Complete w Microscopic     Status: Abnormal   Collection Time: 06/04/19  7:10 PM  Result Value Ref Range   Color, Urine YELLOW (A) YELLOW   APPearance TURBID (A) CLEAR   Specific Gravity, Urine 1.035 (H) 1.005 - 1.030   pH 5.0 5.0 - 8.0   Glucose, UA 50 (A) NEGATIVE mg/dL   Hgb urine dipstick NEGATIVE NEGATIVE   Bilirubin Urine NEGATIVE NEGATIVE   Ketones, ur NEGATIVE NEGATIVE mg/dL   Protein, ur 100 (A) NEGATIVE mg/dL   Nitrite NEGATIVE NEGATIVE   Leukocytes,Ua NEGATIVE NEGATIVE   RBC / HPF 21-50 0 - 5 RBC/hpf   WBC, UA 0-5 0 - 5 WBC/hpf   Bacteria, UA FEW (A) NONE SEEN   Squamous Epithelial / LPF NONE SEEN 0 - 5   Mucus PRESENT    Hyaline Casts, UA PRESENT     Comment: Performed at [facility], [address], [place],  [address]  Urine culture     Status: None   Collection Time: 06/04/19  7:10 PM   Specimen: Urine, Clean Catch  Result Value Ref Range   Specimen Description  URINE, CLEAN CATCH Performed at [facility], [address], [place], [place] [address]    Special Requests      NONE Performed  at [facility], [address] [place], [place] [address]    Culture      NO GROWTH Performed at [facility], [place] [address] [place], [place] [address]    Report Status 06/05/2019 FINAL   Pathologist smear review     Status: None   Collection Time: 06/04/19  7:10 PM  Result Value Ref Range   Path Review Blood smear is reviewed.     Comment: Patient with history of heart failure, COPD, DM, asthma presents with headache and hypoxia. Leukocytosis with left shift and a rare possible circulating blast. Rare circulating nucleated RBCs.   Platelets are adequate in number and demonstrate unremarkable morphology. Reactive lymphocytes. Reviewed by Elmon Kirschner, M.D. Performed at [facility], [address], [place], [place] [address]   Urine Drug Screen, Qualitative [facility] only)     Status: Abnormal   Collection Time: 06/04/19  7:10 PM  Result Value Ref Range   Tricyclic, Ur Screen POSITIVE (A) NONE DETECTED   Amphetamines, Ur Screen NONE DETECTED NONE DETECTED   MDMA (Ecstasy)Ur Screen NONE DETECTED NONE DETECTED   Cocaine Metabolite,Ur [place] NONE DETECTED NONE DETECTED   Opiate, Ur Screen POSITIVE (A) NONE DETECTED   Phencyclidine (PCP) Ur S NONE DETECTED NONE DETECTED   Cannabinoid 50 Ng, Ur [place] POSITIVE (A) NONE DETECTED   Barbiturates, Ur Screen NONE DETECTED NONE DETECTED   Benzodiazepine, Ur Scrn NONE DETECTED NONE DETECTED   Methadone Scn, Ur NONE DETECTED NONE DETECTED    Comment: (NOTE) Tricyclics + metabolites, urine    Cutoff 1000 ng/mL Amphetamines + metabolites, urine  Cutoff 1000 ng/mL MDMA (Ecstasy), urine              Cutoff 500 ng/mL Cocaine Metabolite, urine          Cutoff 300 ng/mL Opiate + metabolites, urine        Cutoff 300 ng/mL Phencyclidine (PCP), urine         Cutoff 25 ng/mL Cannabinoid, urine                 Cutoff 50 ng/mL Barbiturates + metabolites, urine  Cutoff 200 ng/mL Benzodiazepine, urine              Cutoff 200  ng/mL Methadone, urine                   Cutoff 300 ng/mL The urine drug screen provides only a preliminary, unconfirmed analytical test result and should not be used for non-medical purposes. Clinical consideration and professional judgment should be applied to any positive drug screen result due to possible interfering substances. A more specific alternate chemical method must be used in order to obtain a confirmed analytical result. Gas chromatography / mass spectrometry (GC/MS) is the preferred confirmat ory method. Performed at [facility], [address] [place], [place] [address]   Blood Culture (routine x 2)     Status: Abnormal   Collection Time: 06/04/19  7:46 PM   Specimen: BLOOD  Result Value Ref Range   Specimen Description      BLOOD LEFT ANTECUBITAL Performed at [facility], [address] [place], [place] [address]    Special Requests      BOTTLES DRAWN AEROBIC AND ANAEROBIC Blood Culture adequate volume Performed at [facility],  [place], [place] [address]    Culture  Setup Time      GRAM POSITIVE COCCI IN BOTH AEROBIC AND ANAEROBIC BOTTLES CRITICAL RESULT CALLED TO, READ BACK BY AND VERIFIED WITH:  CHRISTINE KATSOUDAS AT [id number] ON 06/05/19 [facility] Performed at [facility], [place], [place] [address]    Culture (A)     VIRIDANS STREPTOCOCCUS STAPHYLOCOCCUS SPECIES (COAGULASE NEGATIVE) GRANULICATELLA ADIACENS ORGANISMS 1 AND 2 THE SIGNIFICANCE OF ISOLATING THIS ORGANISM FROM A SINGLE SET OF BLOOD CULTURES WHEN MULTIPLE SETS ARE DRAWN IS UNCERTAIN. PLEASE NOTIFY THE MICROBIOLOGY DEPARTMENT WITHIN ONE WEEK IF SPECIATION AND SENSITIVITIES ARE REQUIRED. ORGANISM 3 Standardized susceptibility testing for this organism is not available. Performed at [facility], [place] [address] [place], [place] [address]    Report Status 06/07/2019 FINAL   Blood Culture (routine x 2)     Status: None    Collection Time: 06/04/19  7:46 PM   Specimen: BLOOD  Result Value Ref Range   Specimen Description BLOOD RIGHT ANTECUBITAL    Special Requests      BOTTLES DRAWN AEROBIC AND ANAEROBIC Blood Culture adequate volume   Culture      NO GROWTH 5 DAYS Performed at [facility], [address] [place], [place] [address]    Report Status 06/09/2019 FINAL   Blood gas, arterial     Status: Abnormal   Collection Time: 06/04/19  7:53 PM  Result Value Ref Range   FIO2 70.00    Delivery systems VENTILATOR    VT ASSIST CONTROL mL   Peep/cpap 5.0 cm H20   pH, Arterial 7.38 7.350 - 7.450   pCO2 arterial 61 (H) 32.0 - 48.0 mmHg   pO2, Arterial 213 (H) 83.0 - 108.0 mmHg   Bicarbonate 36.1 (H) 20.0 - 28.0 mmol/L   Acid-Base Excess 8.7 (H) 0.0 - 2.0 mmol/L   O2 Saturation 99.7 %   Patient temperature 37.0    Collection site LEFT RADIAL    [place]ens test (pass/fail) ARTERIAL DRAW (A) PASS   Mechanical Rate 20     Comment: Performed at [facility], [address] [place], [place] [address]  POC SARS Coronavirus 2 Ag     Status: None   Collection Time: 06/04/19  8:06 PM  Result Value Ref Range   SARS Coronavirus 2 Ag NEGATIVE NEGATIVE    Comment: (NOTE) SARS-CoV-2 antigen NOT DETECTED.  Negative results are presumptive.  Negative results do not preclude SARS-CoV-2 infection and should not be used as the sole basis for treatment or other patient management decisions, including infection  control decisions, particularly in the presence of clinical signs and  symptoms consistent with COVID-19, or in those who have been in contact with the virus.  Negative results must be combined with clinical observations, patient history, and epidemiological information. The expected result is Negative. Fact Sheet for Patients: PodPark.tn Fact Sheet for Healthcare Providers: GiftContent.is This test is not yet approved or cleared by  the [place] FDA and  has been authorized for detection and/or diagnosis of SARS-CoV-2 by FDA under an Emergency Use Authorization (EUA).  This EUA will remain in effect (meaning this test can be used) for the duration of  the COVID-19 de claration under Section 564(b)(1) of the Act, 21 U.S.C. section 360bbb-3(b)(1), unless the authorization is terminated or revoked sooner.   Lactic acid, plasma     Status: None   Collection Time: 06/04/19  9:10 PM  Result  Value Ref Range   Lactic Acid, Venous 1.8 0.5 - 1.9 mmol/L    Comment: Performed at [facility], [address] [place], [place] [address]  Ferritin (Iron Binding Protein)     Status: None   Collection Time: 06/04/19 11:16 PM  Result Value Ref Range   Ferritin 52 11 - 307 ng/mL    Comment: Performed at [facility], [address] [place], [place] [address]  Lactate dehydrogenase     Status: None   Collection Time: 06/04/19 11:16 PM  Result Value Ref Range   LDH 152 98 - 192 U/L    Comment: Performed at [facility], [address] [place], [place] [address]  Procalcitonin     Status: None   Collection Time: 06/04/19 11:16 PM  Result Value Ref Range   Procalcitonin 0.11 ng/mL    Comment:        Interpretation: PCT (Procalcitonin) <= 0.5 ng/mL: Systemic infection (sepsis) is not likely. Local bacterial infection is possible. (NOTE)       Sepsis PCT Algorithm           Lower Respiratory Tract                                      Infection PCT Algorithm    ----------------------------     ----------------------------         PCT < 0.25 ng/mL                PCT < 0.10 ng/mL         Strongly encourage             Strongly discourage   discontinuation of antibiotics    initiation of antibiotics    ----------------------------     -----------------------------       PCT 0.25 - 0.50 ng/mL            PCT 0.10 - 0.25 ng/mL               OR       >80% decrease in PCT            Discourage  initiation of                                            antibiotics      Encourage discontinuation           of antibiotics    ----------------------------     -----------------------------         PCT >= 0.50 ng/mL              PCT 0.26 - 0.50 ng/mL               AND        <80% decrease in PCT             Encourage initiation of                                             antibiotics       Encourage continuation           of antibiotics    ----------------------------     -----------------------------  PCT >= 0.50 ng/mL                  PCT > 0.50 ng/mL               AND         increase in PCT                  Strongly encourage                                      initiation of antibiotics    Strongly encourage escalation           of antibiotics                                     -----------------------------                                           PCT <= 0.25 ng/mL                                                 OR                                        > 80% decrease in PCT                                     Discontinue / Do not initiate                                             antibiotics Performed at [facility], [address] [place], [place] [address]   Troponin I (High Sensitivity)     Status: Abnormal   Collection Time: 06/04/19 11:16 PM  Result Value Ref Range   Troponin I (High Sensitivity) 33 (H) <18 ng/L    Comment: (NOTE) Elevated high sensitivity troponin I (hsTnI) values and significant  changes across serial measurements may suggest ACS but many other  chronic and acute conditions are known to elevate hsTnI results.  Refer to the "Links" section for chest pain algorithms and additional  guidance. Performed at [facility], [address] [place], [place] [address]   Fibrin derivatives D-[facility] only)     Status: Abnormal   Collection Time: 06/04/19 11:16 PM  Result Value Ref Range   Fibrin derivatives D-dimer  ([facility]) 1,420.29 (H) 0.00 - 499.00 ng/mL (FEU)    Comment: (NOTE) <> Exclusion of Venous Thromboembolism (VTE) - OUTPATIENT ONLY   (Emergency Department or [place])   0-499 ng/ml (FEU): With a low to intermediate pretest probability                      for VTE this test result excludes the diagnosis  of VTE.   >499 ng/ml (FEU) : VTE not excluded; additional work up for VTE is                      required. <> Testing on Inpatients and Evaluation of Disseminated Intravascular   Coagulation (DIC) Reference Range:   0-499 ng/ml (FEU) Performed at [facility], [address] [place], [place] [address]   C-reactive protein     Status: None   Collection Time: 06/04/19 11:16 PM  Result Value Ref Range   CRP 0.5 <1.0 mg/dL    Comment: Performed at [place] [address] [place], [place] [address]  Ethanol     Status: None   Collection Time: 06/04/19 11:16 PM  Result Value Ref Range   Alcohol, Ethyl (B) <10 <10 mg/dL    Comment: (NOTE) Lowest detectable limit for serum alcohol is 10 mg/dL. For medical purposes only. Performed at [facility], [address] [place], [place] [address]   Hemoglobin A1c     Status: Abnormal   Collection Time: 06/04/19 11:16 PM  Result Value Ref Range   Hgb A1c MFr Bld 6.8 (H) 4.8 - 5.6 %    Comment: (NOTE) Pre diabetes:          5.7%-6.4% Diabetes:              >6.4% Glycemic control for   <7.0% adults with diabetes    Mean Plasma Glucose 148.46 mg/dL    Comment: Performed at [place] [address], [place], [place] [address]  Glucose, capillary     Status: Abnormal   Collection Time: 06/05/19 12:46 AM  Result Value Ref Range   Glucose-Capillary 193 (H) 70 - 99 mg/dL  MRSA PCR Screening     Status: None   Collection Time: 06/05/19  1:13 AM   Specimen: Nasopharyngeal  Result Value Ref Range   MRSA by PCR NEGATIVE NEGATIVE    Comment:        The GeneXpert MRSA Assay (FDA approved  for NASAL specimens only), is one component of a comprehensive MRSA colonization surveillance program. It is not intended to diagnose MRSA infection nor to guide or monitor treatment for MRSA infections. Performed at [facility], [address] [place], [place] [address]   Glucose, capillary     Status: Abnormal   Collection Time: 06/05/19  4:04 AM  Result Value Ref Range   Glucose-Capillary 189 (H) 70 - 99 mg/dL  Blood gas, arterial     Status: Abnormal   Collection Time: 06/05/19  4:08 AM  Result Value Ref Range   FIO2 0.45    Delivery systems VENTILATOR    Mode PRESSURE REGULATED VOLUME CONTROL    VT 450 mL   Peep/cpap 5.0 cm H20   pH, Arterial 7.39 7.350 - 7.450   pCO2 arterial 62 (H) 32.0 - 48.0 mmHg   pO2, Arterial 70 (L) 83.0 - 108.0 mmHg   Bicarbonate 37.5 (H) 20.0 - 28.0 mmol/L   Acid-Base Excess 10.1 (H) 0.0 - 2.0 mmol/L   O2 Saturation 93.6 %   Patient temperature 37.0    Collection site RIGHT RADIAL    Sample type ARTERIAL DRAW    Allens test (pass/fail) PASS PASS   Mechanical Rate 20     Comment: Performed at [facility], [address] [place], [place] [address]  CBC     Status: Abnormal   Collection Time: 06/05/19  4:52 AM  Result Value Ref Range   WBC 7.4 4.0 - 10.5 K/uL   RBC 5.23 (H) 3.87 - 5.11 MIL/uL   Hemoglobin 15.3 (H) 12.0 - 15.0 g/dL   HCT 48.8 (H) 36.0 - 46.0 %   MCV 93.3 80.0 - 100.0 fL   MCH 29.3 26.0 - 34.0 pg   MCHC 31.4 30.0 - 36.0 g/dL   RDW 16.3 (H) 11.5 - 15.5 %   Platelets 197 150 - 400 K/uL   nRBC 0.3 (H) 0.0 - 0.2 %    Comment: Performed at [facility], [address] [place], [place] [address]  Magnesium     Status: Abnormal   Collection Time: 06/05/19  4:52 AM  Result Value Ref Range   Magnesium 2.6 (H) 1.7 - 2.4 mg/dL    Comment: Performed at [facility], [address] [place], [place] [address]  Phosphorus     Status: None   Collection Time: 06/05/19  4:52 AM  Result Value  Ref Range   Phosphorus 2.5 2.5 - 4.6 mg/dL    Comment: Performed at [facility], [address] [place], [place] [address]  Comprehensive metabolic panel     Status: Abnormal   Collection Time: 06/05/19  4:52 AM  Result Value Ref Range   Sodium 142 135 - 145 mmol/L   Potassium 4.2 3.5 - 5.1 mmol/L   Chloride 97 (L) 98 - 111 mmol/L   CO2 27 22 - 32 mmol/L   Glucose, Bld 188 (H) 70 - 99 mg/dL   BUN 15 6 - 20 mg/dL   Creatinine, Ser 0.80 0.44 - 1.00 mg/dL   Calcium 8.4 (L) 8.9 - 10.3 mg/dL   Total Protein 6.4 (L) 6.5 - 8.1 g/dL   Albumin 3.4 (L) 3.5 - 5.0 g/dL   AST 34 15 - 41 U/L   ALT 33 0 - 44 U/L   Alkaline Phosphatase 53 38 - 126 U/L   Total Bilirubin 0.6 0.3 - 1.2 mg/dL   GFR calc non Af Amer >60 >60 mL/min   GFR calc Af Amer >60 >60 mL/min   Anion gap 18 (H) 5 - 15    Comment: Performed at [facility], [address] [place],  [address]  Procalcitonin - Baseline     Status: None   Collection Time: 06/05/19  4:52 AM  Result Value Ref Range   Procalcitonin 0.12 ng/mL    Comment:        Interpretation: PCT (Procalcitonin) <= 0.5 ng/mL: Systemic infection (sepsis) is not likely. Local bacterial infection is possible. (NOTE)       Sepsis PCT Algorithm           Lower Respiratory Tract                                      Infection PCT Algorithm    ----------------------------     ----------------------------         PCT < 0.25 ng/mL                PCT < 0.10 ng/mL         Strongly encourage             Strongly discourage   discontinuation of antibiotics    initiation of antibiotics    ----------------------------     -----------------------------       PCT 0.25 -  0.50 ng/mL            PCT 0.10 - 0.25 ng/mL               OR       >80% decrease in PCT            Discourage initiation of                                            antibiotics      Encourage discontinuation           of antibiotics    ----------------------------      -----------------------------         PCT >= 0.50 ng/mL              PCT 0.26 - 0.50 ng/mL               AND        <80% decrease in PCT             Encourage initiation of                                             antibiotics       Encourage continuation           of antibiotics    ----------------------------     -----------------------------        PCT >= 0.50 ng/mL                  PCT > 0.50 ng/mL               AND         increase in PCT                  Strongly encourage                                      initiation of antibiotics    Strongly encourage escalation           of antibiotics                                     -----------------------------                                           PCT <= 0.25 ng/mL                                                 OR                                        > 80% decrease in PCT  Discontinue / Do not initiate                                             antibiotics Performed at [facility], [address] [place], [place] [address]   Glucose, capillary     Status: Abnormal   Collection Time: 06/05/19  7:35 AM  Result Value Ref Range   Glucose-Capillary 178 (H) 70 - 99 mg/dL  Glucose, capillary     Status: Abnormal   Collection Time: 06/05/19 11:20 AM  Result Value Ref Range   Glucose-Capillary 160 (H) 70 - 99 mg/dL  Culture, respiratory (non-expectorated)     Status: None   Collection Time: 06/05/19 12:07 PM   Specimen: Tracheal Aspirate; Respiratory  Result Value Ref Range   Specimen Description      TRACHEAL ASPIRATE Performed at [facility], [address] [place], [place] [address]    Special Requests      NONE Performed at [facility], [place], [place] [address]    Gram Stain      ABUNDANT WBC PRESENT, PREDOMINANTLY PMN FEW GRAM POSITIVE COCCI    Culture      RARE Consistent with normal respiratory flora. Performed at [facility], [place] [address], [place], [place] [address]    Report Status 06/08/2019 FINAL   Glucose, capillary     Status: Abnormal   Collection Time: 06/05/19  3:37 PM  Result Value Ref Range   Glucose-Capillary 122 (H) 70 - 99 mg/dL  Glucose, capillary     Status: Abnormal   Collection Time: 06/05/19  7:29 PM  Result Value Ref Range   Glucose-Capillary 154 (H) 70 - 99 mg/dL  Glucose, capillary     Status: Abnormal   Collection Time: 06/05/19 11:40 PM  Result Value Ref Range   Glucose-Capillary 134 (H) 70 - 99 mg/dL  Glucose, capillary     Status: Abnormal   Collection Time: 06/06/19  3:42 AM  Result Value Ref Range   Glucose-Capillary 184 (H) 70 - 99 mg/dL  Procalcitonin     Status: None   Collection Time: 06/06/19  5:22 AM  Result Value Ref Range   Procalcitonin <0.10 ng/mL    Comment:        Interpretation: PCT (Procalcitonin) <= 0.5 ng/mL: Systemic infection (sepsis) is not likely. Local bacterial infection is possible. (NOTE)       Sepsis PCT Algorithm           Lower Respiratory Tract                                      Infection PCT Algorithm    ----------------------------     ----------------------------         PCT < 0.25 ng/mL                PCT < 0.10 ng/mL         Strongly encourage             Strongly discourage   discontinuation of antibiotics    initiation of antibiotics    ----------------------------     -----------------------------       PCT 0.25 - 0.50 ng/mL  PCT 0.10 - 0.25 ng/mL               OR       >80% decrease in PCT            Discourage initiation of                                            antibiotics      Encourage discontinuation           of antibiotics    ----------------------------     -----------------------------         PCT >= 0.50 ng/mL              PCT 0.26 - 0.50 ng/mL               AND        <80% decrease in PCT             Encourage initiation of                                             antibiotics        Encourage continuation           of antibiotics    ----------------------------     -----------------------------        PCT >= 0.50 ng/mL                  PCT > 0.50 ng/mL               AND         increase in PCT                  Strongly encourage                                      initiation of antibiotics    Strongly encourage escalation           of antibiotics                                     -----------------------------                                           PCT <= 0.25 ng/mL                                                 OR                                        > 80% decrease in PCT  Discontinue / Do not initiate                                             antibiotics Performed at [facility], [address] [place], [place] [address]   CBC with Differential/Platelet     Status: Abnormal   Collection Time: 06/06/19  5:22 AM  Result Value Ref Range   WBC 10.2 4.0 - 10.5 K/uL   RBC 4.90 3.87 - 5.11 MIL/uL   Hemoglobin 14.4 12.0 - 15.0 g/dL   HCT 46.9 (H) 36.0 - 46.0 %   MCV 95.7 80.0 - 100.0 fL   MCH 29.4 26.0 - 34.0 pg   MCHC 30.7 30.0 - 36.0 g/dL   RDW 16.9 (H) 11.5 - 15.5 %   Platelets 219 150 - 400 K/uL   nRBC 0.0 0.0 - 0.2 %   Neutrophils Relative % 89 %   Neutro Abs 9.1 (H) 1.7 - 7.7 K/uL   Lymphocytes Relative 7 %   Lymphs Abs 0.7 0.7 - 4.0 K/uL   Monocytes Relative 4 %   Monocytes Absolute 0.4 0.1 - 1.0 K/uL   Eosinophils Relative 0 %   Eosinophils Absolute 0.0 0.0 - 0.5 K/uL   Basophils Relative 0 %   Basophils Absolute 0.0 0.0 - 0.1 K/uL   Immature Granulocytes 0 %   Abs Immature Granulocytes 0.03 0.00 - 0.07 K/uL    Comment: Performed at [facility], [address] [place], [place] [id number]  Basic metabolic panel     Status: Abnormal   Collection Time: 06/06/19  5:22 AM  Result Value Ref Range   Sodium 139 135 - 145 mmol/L   Potassium 4.5 3.5 - 5.1 mmol/L   Chloride 94 (L) 98 -  111 mmol/L   CO2 32 22 - 32 mmol/L   Glucose, Bld 158 (H) 70 - 99 mg/dL   BUN 21 (H) 6 - 20 mg/dL   Creatinine, Ser 0.73 0.44 - 1.00 mg/dL   Calcium 8.8 (L) 8.9 - 10.3 mg/dL   GFR calc non Af Amer >60 >60 mL/min   GFR calc Af Amer >60 >60 mL/min   Anion gap 13 5 - 15    Comment: Performed at [facility], [address] [place], [place] [address]  Glucose, capillary     Status: Abnormal   Collection Time: 06/06/19  8:09 AM  Result Value Ref Range   Glucose-Capillary 127 (H) 70 - 99 mg/dL  Glucose, capillary     Status: Abnormal   Collection Time: 06/06/19 11:29 AM  Result Value Ref Range   Glucose-Capillary 141 (H) 70 - 99 mg/dL  Glucose, capillary     Status: Abnormal   Collection Time: 06/06/19  3:53 PM  Result Value Ref Range   Glucose-Capillary 151 (H) 70 - 99 mg/dL  Glucose, capillary     Status: Abnormal   Collection Time: 06/06/19  7:45 PM  Result Value Ref Range   Glucose-Capillary 166 (H) 70 - 99 mg/dL   Comment 1 Document in Chart   Glucose, capillary     Status: Abnormal   Collection Time: 06/06/19 11:51 PM  Result Value Ref Range   Glucose-Capillary 203 (H) 70 - 99 mg/dL  Glucose, capillary     Status: Abnormal   Collection Time: 06/07/19  3:56 AM  Result Value Ref Range   Glucose-Capillary  166 (H) 70 - 99 mg/dL  Procalcitonin     Status: None   Collection Time: 06/07/19  4:26 AM  Result Value Ref Range   Procalcitonin <0.10 ng/mL    Comment:        Interpretation: PCT (Procalcitonin) <= 0.5 ng/mL: Systemic infection (sepsis) is not likely. Local bacterial infection is possible. (NOTE)       Sepsis PCT Algorithm           Lower Respiratory Tract                                      Infection PCT Algorithm    ----------------------------     ----------------------------         PCT < 0.25 ng/mL                PCT < 0.10 ng/mL         Strongly encourage             Strongly discourage   discontinuation of antibiotics    initiation of  antibiotics    ----------------------------     -----------------------------       PCT 0.25 - 0.50 ng/mL            PCT 0.10 - 0.25 ng/mL               OR       >80% decrease in PCT            Discourage initiation of                                            antibiotics      Encourage discontinuation           of antibiotics    ----------------------------     -----------------------------         PCT >= 0.50 ng/mL              PCT 0.26 - 0.50 ng/mL               AND        <80% decrease in PCT             Encourage initiation of                                             antibiotics       Encourage continuation           of antibiotics    ----------------------------     -----------------------------        PCT >= 0.50 ng/mL                  PCT > 0.50 ng/mL               AND         increase in PCT                  Strongly encourage  initiation of antibiotics    Strongly encourage escalation           of antibiotics                                     -----------------------------                                           PCT <= 0.25 ng/mL                                                 OR                                        > 80% decrease in PCT                                     Discontinue / Do not initiate                                             antibiotics Performed at [facility], [address] [place], [place] [address]   CBC     Status: Abnormal   Collection Time: 06/07/19  4:26 AM  Result Value Ref Range   WBC 9.0 4.0 - 10.5 K/uL   RBC 4.76 3.87 - 5.11 MIL/uL   Hemoglobin 14.1 12.0 - 15.0 g/dL   HCT 43.5 36.0 - 46.0 %   MCV 91.4 80.0 - 100.0 fL   MCH 29.6 26.0 - 34.0 pg   MCHC 32.4 30.0 - 36.0 g/dL   RDW 17.1 (H) 11.5 - 15.5 %   Platelets 208 150 - 400 K/uL   nRBC 0.0 0.0 - 0.2 %    Comment: Performed at [facility], [address] [place], [place] [id number]  Basic metabolic panel      Status: Abnormal   Collection Time: 06/07/19  4:26 AM  Result Value Ref Range   Sodium 139 135 - 145 mmol/L   Potassium 4.8 3.5 - 5.1 mmol/L   Chloride 96 (L) 98 - 111 mmol/L   CO2 37 (H) 22 - 32 mmol/L   Glucose, Bld 155 (H) 70 - 99 mg/dL   BUN 27 (H) 6 - 20 mg/dL   Creatinine, Ser 0.71 0.44 - 1.00 mg/dL   Calcium 8.6 (L) 8.9 - 10.3 mg/dL   GFR calc non Af Amer >60 >60 mL/min   GFR calc Af Amer >60 >60 mL/min   Anion gap 6 5 - 15    Comment: Performed at [facility], [address] [place], [place] [address]  Glucose, capillary     Status: Abnormal   Collection Time: 06/07/19  7:40 AM  Result Value Ref Range   Glucose-Capillary 110 (H) 70 - 99 mg/dL  Glucose, capillary     Status: Abnormal   Collection Time: 06/07/19 12:14 PM  Result Value Ref Range   Glucose-Capillary 153 (H) 70 - 99 mg/dL  Culture, blood (routine x 2)     Status: None   Collection Time: 06/07/19 12:23 PM   Specimen: BLOOD  Result Value Ref Range   Specimen Description BLOOD LEFT ANTECUBITAL    Special Requests      BOTTLES DRAWN AEROBIC AND ANAEROBIC Blood Culture adequate volume   Culture      NO GROWTH 5 DAYS Performed at [facility], [address] [place], [place] [address]    Report Status 06/12/2019 FINAL   Culture, blood (routine x 2)     Status: None   Collection Time: 06/07/19 12:29 PM   Specimen: BLOOD  Result Value Ref Range   Specimen Description BLOOD BLOOD LEFT HAND    Special Requests      BOTTLES DRAWN AEROBIC AND ANAEROBIC Blood Culture adequate volume   Culture      NO GROWTH 5 DAYS Performed at [facility], [address] [place], [place] [address]    Report Status 06/12/2019 FINAL   ECHOCARDIOGRAM COMPLETE     Status: None   Collection Time: 06/07/19  1:30 PM  Result Value Ref Range   Weight 3,368.63 oz   Height 65 in   BP 113/75 mmHg  Glucose, capillary     Status: Abnormal   Collection Time: 06/07/19  3:30 PM  Result Value Ref Range    Glucose-Capillary 234 (H) 70 - 99 mg/dL  Glucose, capillary     Status: Abnormal   Collection Time: 06/07/19  7:32 PM  Result Value Ref Range   Glucose-Capillary 172 (H) 70 - 99 mg/dL  Glucose, capillary     Status: Abnormal   Collection Time: 06/08/19  7:34 AM  Result Value Ref Range   Glucose-Capillary 121 (H) 70 - 99 mg/dL  Glucose, capillary     Status: Abnormal   Collection Time: 06/08/19 11:34 AM  Result Value Ref Range   Glucose-Capillary 122 (H) 70 - 99 mg/dL    Radiology: ECHOCARDIOGRAM COMPLETE  Result Date: 06/07/2019   ECHOCARDIOGRAM REPORT   Patient Name:   Shelly [name] Date of Exam: 06/07/2019 Medical Rec #:  [id number]      Height:       65.0 in Accession #:    [id number]     Weight:       210.5 lb Date of Birth:  13-Mar-1961      BSA:          2.02 m Patient Age:    59 years       BP:           113/75 mmHg Patient Gender: F              HR:           76 bpm. Exam Location:  [facility] Procedure: 2D Echo, Cardiac Doppler and Color Doppler Indications:     Bacteremia 790.7  History:         Patient has prior history of Echocardiogram examinations, most                  recent 02/28/2019. CHF, COPD; Risk Factors:Hypertension and                  Diabetes.  Sonographer:     Sherrie Sport RDCS (AE) Referring Phys:  [place] Diagnosing Phys: Kate Sable MD IMPRESSIONS  1. Left ventricular ejection fraction, by visual estimation,  is 60 to 65%. The left ventricle has normal function. There is no left ventricular hypertrophy.  2. The left ventricle has no regional wall motion abnormalities.  3. Global right ventricle has normal systolic function.The right ventricular size is normal. No increase in right ventricular wall thickness.  4. Left atrial size was mildly dilated.  5. Right atrial size was normal.  6. The mitral valve is normal in structure. No evidence of mitral valve regurgitation. No evidence of mitral stenosis.  7. The tricuspid valve is normal in structure.  8. The  aortic valve is normal in structure. Aortic valve regurgitation is not visualized. No evidence of aortic valve sclerosis or stenosis.  9. The pulmonic valve was normal in structure. Pulmonic valve regurgitation is not visualized. 10. Normal pulmonary artery systolic pressure. 11. The inferior vena cava is normal in size with greater than 50% respiratory variability, suggesting right atrial pressure of 3 mmHg. FINDINGS  Left Ventricle: Left ventricular ejection fraction, by visual estimation, is 60 to 65%. The left ventricle has normal function. The left ventricle has no regional wall motion abnormalities. There is no left ventricular hypertrophy. Left ventricular diastolic parameters were normal. Normal left atrial pressure. Right Ventricle: The right ventricular size is normal. No increase in right ventricular wall thickness. Global RV systolic function is has normal systolic function. The tricuspid regurgitant velocity is 1.97 m/s, and with an assumed right atrial pressure  of 3 mmHg, the estimated right ventricular systolic pressure is normal at 18.5 mmHg. Left Atrium: Left atrial size was mildly dilated. Right Atrium: Right atrial size was normal in size Pericardium: There is no evidence of pericardial effusion. Mitral Valve: The mitral valve is normal in structure. No evidence of mitral valve regurgitation. No evidence of mitral valve stenosis by observation. Tricuspid Valve: The tricuspid valve is normal in structure. Tricuspid valve regurgitation is not demonstrated. Aortic Valve: The aortic valve is normal in structure. Aortic valve regurgitation is not visualized. The aortic valve is structurally normal, with no evidence of sclerosis or stenosis. Aortic valve mean gradient measures 5.7 mmHg. Aortic valve peak gradient measures 9.1 mmHg. Aortic valve area, by VTI measures 2.13 cm. Pulmonic Valve: The pulmonic valve was normal in structure. Pulmonic valve regurgitation is not visualized. Pulmonic  regurgitation is not visualized. Aorta: The aortic root, ascending aorta and aortic arch are all structurally normal, with no evidence of dilitation or obstruction and the aortic root is normal in size and structure. Venous: The inferior vena cava is normal in size with greater than 50% respiratory variability, suggesting right atrial pressure of 3 mmHg. IAS/Shunts: No atrial level shunt detected by color flow Doppler. There is no evidence of a patent foramen ovale.  LEFT VENTRICLE PLAX 2D LVIDd:         4.56 cm  Diastology LVIDs:         2.75 cm  LV e' lateral:   13.40 cm/s LV PW:         0.81 cm  LV E/e' lateral: 7.2 LV IVS:        0.80 cm  LV e' medial:    9.03 cm/s LVOT diam:     2.00 cm  LV E/e' medial:  10.7 LV SV:         67 ml LV SV Index:   31.46 LVOT Area:     3.14 cm  RIGHT VENTRICLE RV Basal diam:  2.83 cm RV S prime:     16.20 cm/s TAPSE (M-mode): 4.2 cm LEFT  ATRIUM             Index       RIGHT ATRIUM           Index LA diam:        4.00 cm 1.98 cm/m  RA Area:     16.90 cm LA Vol (A2C):   50.0 ml 24.73 ml/m RA Volume:   43.50 ml  21.52 ml/m LA Vol (A4C):   76.2 ml 37.69 ml/m LA Biplane Vol: 63.8 ml 31.56 ml/m  AORTIC VALVE                    PULMONIC VALVE AV Area (Vmax):    1.94 cm     PV Vmax:        0.79 m/s AV Area (Vmean):   1.98 cm     PV Peak grad:   2.5 mmHg AV Area (VTI):     2.13 cm     RVOT Peak grad: 5 mmHg AV Vmax:           151.00 cm/s AV Vmean:          105.433 cm/s AV VTI:            0.283 m AV Peak Grad:      9.1 mmHg AV Mean Grad:      5.7 mmHg LVOT Vmax:         93.10 cm/s LVOT Vmean:        66.600 cm/s LVOT VTI:          0.192 m LVOT/AV VTI ratio: 0.68  AORTA Ao Root diam: 2.70 cm MITRAL VALVE                        TRICUSPID VALVE MV Area (PHT): 3.60 cm             TR Peak grad:   15.5 mmHg MV PHT:        61.19 msec           TR Vmax:        197.00 cm/s MV Decel Time: 211 msec MV E velocity: 96.50 cm/s 103 cm/s  SHUNTS MV A velocity: 63.50 cm/s 70.3 cm/s Systemic VTI:   0.19 m MV E/A ratio:  1.52       1.5       Systemic Diam: 2.00 cm  Kate Sable MD Electronically signed by Kate Sable MD Signature Date/Time: 06/07/2019/3:15:49 PM    Final     No results found.  DG Abd 1 View  Result Date: 06/05/2019 CLINICAL DATA:  OG tube placement EXAM: ABDOMEN - 1 VIEW COMPARISON:  Radiograph 05/01/2011, CT a chest 05/05/2019 FINDINGS: A transesophageal tube tip and side port terminate in the region of the gastric body, beyond the GE junction. Pacer pads overlie the chest. Additional support devices including catheter tubing and telemetry leads overlie the upper abdomen. Bowel gas pattern is unremarkable. Persisting consolidation partially obscuring the left hemidiaphragm. Additional atelectatic changes in the right lung base. IMPRESSION: 1. A transesophageal tube tip and side port terminate in the region of the gastric body, beyond the GE junction. 2. Persisting consolidation partially obscuring the left hemidiaphragm. Electronically Signed   By: Lovena Le M.D.   On: 06/05/2019 00:40   CT Head Wo Contrast  Result Date: 06/04/2019 CLINICAL DATA:  Acute hypoxic respiratory failure, altered mental status EXAM: CT HEAD WITHOUT CONTRAST TECHNIQUE: Contiguous axial images were obtained from the  base of the skull through the vertex without intravenous contrast. COMPARISON:  CT head 02/27/2019 FINDINGS: Brain: No evidence of acute infarction, hemorrhage, hydrocephalus, extra-axial collection or mass lesion/mass effect. Vascular: Atherosclerotic calcification of the carotid siphons. No hyperdense vessel. Skull: No calvarial fracture or suspicious osseous lesion. No scalp swelling or hematoma. Sinuses/Orbits: Paranasal sinuses and mastoid air cells are predominantly clear. Included orbital structures are unremarkable. Other: None IMPRESSION: No acute intracranial abnormality. Electronically Signed   By: Lovena Le M.D.   On: 06/04/2019 22:59   CT Angio Chest PE W/Cm &/Or  Wo Cm  Result Date: 06/04/2019 CLINICAL DATA:  Acute hypoxic respiratory failure, negative COVID-19 testing EXAM: CT ANGIOGRAPHY CHEST WITH CONTRAST TECHNIQUE: Multidetector CT imaging of the chest was performed using the standard protocol during bolus administration of intravenous contrast. Multiplanar CT image reconstructions and MIPs were obtained to evaluate the vascular anatomy. CONTRAST:  50mL OMNIPAQUE IOHEXOL 350 MG/ML SOLN COMPARISON:  Chest CTA 04/20/2019 FINDINGS: Cardiovascular: Satisfactory opacification the pulmonary arteries to the segmental level. No pulmonary artery filling defects are identified. Central pulmonary arteries remain normal caliber. Small amount of gas in the right atrium and central pulmonary artery as well as in the superficial veins of the upper chest. Finding is likely iatrogenic and related to intravenous access. Cardiac size at the upper limits of normal. No pericardial effusion. Coronary artery calcifications are minimal. Atherosclerotic plaque within the normal caliber aorta. Normal 3 vessel branching of the aortic arch. Minimal calcifications in the proximal great vessels. Mediastinum/Nodes: Thyroid and thoracic inlet is unremarkable. No mediastinal, hilar or axillary adenopathy. The thoracic esophagus is patulous and fluid-filled. A transesophageal tube terminates below the level of imaging with the side port beyond the GE junction. Patient is intubated at this time with the endotracheal tip approximately 3.4 cm from the carina. Lungs/Pleura: There are several bandlike areas of opacity similar to comparison likely reflective of scarring/architectural distortion. Some dependent consolidated lung in the left base with volume loss, much of which is likely atelectatic however some mixed hypoattenuation of the consolidative lung could suggest underlying infection or aspiration. Upper Abdomen: Trace amount of perihepatic ascites. Stable appearance of the bilateral adrenal  adenomas. Musculoskeletal: No acute osseous abnormality or suspicious osseous lesion. Mild exaggerated thoracic kyphosis with multilevel discogenic change. Review of the MIP images confirms the above findings. IMPRESSION: 1. Negative for acute pulmonary embolus. 2. Small amount of gas in the right atrium and central pulmonary artery as well as in the superficial veins of the upper chest. Finding is likely iatrogenic and related to intravenous access. 3. Consolidated lung in the left base with volume loss, much of which is likely atelectatic however some mixed hypoattenuation of the consolidative lung could suggest underlying infection or aspiration particularly given a patulous, fluid-filled esophagus despite NG placement. 4. Stable bilateral adrenal adenomas. 5. Trace amount of perihepatic ascites, new from prior. 6. Aortic Atherosclerosis (ICD10-I70.0). Electronically Signed   By: Lovena Le M.D.   On: 06/04/2019 22:57   DG Chest Portable 1 View  Result Date: 06/04/2019 CLINICAL DATA:  Headache, respiratory failure, became unresponsive, family positive for COVID-19; past history asthma, CHF, COPD, diabetes mellitus, hypertension EXAM: PORTABLE CHEST 1 VIEW COMPARISON:  Portable exam 1903 hours compared to 04/20/2019 FINDINGS: Tip of endotracheal tube projects 5.6 cm above carina. External pacing leads project over chest. Normal heart size and mediastinal contours. Atherosclerotic calcification aorta. Chronic accentuation of bibasilar markings. No definite acute infiltrate, pleural effusion or pneumothorax. IMPRESSION: Bibasilar scarring without acute abnormalities.  Electronically Signed   By: Lavonia Dana M.D.   On: 06/04/2019 19:32   ECHOCARDIOGRAM COMPLETE  Result Date: 06/07/2019   ECHOCARDIOGRAM REPORT   Patient Name:   Shelly [name] [facility] Date of Exam: 06/07/2019 Medical Rec #:  [id number]      Height:       65.0 in Accession #:    [id number]     Weight:       210.5 lb Date of Birth:  03-15-1961      BSA:           2.02 m Patient Age:    25 years       BP:           113/75 mmHg Patient Gender: F              HR:           76 bpm. Exam Location:  [facility] Procedure: 2D Echo, Cardiac Doppler and Color Doppler Indications:     Bacteremia 790.7  History:         Patient has prior history of Echocardiogram examinations, most                  recent 02/28/2019. CHF, COPD; Risk Factors:Hypertension and                  Diabetes.  Sonographer:     Sherrie Sport RDCS (AE) Referring Phys:  [place] Diagnosing Phys: Kate Sable MD IMPRESSIONS  1. Left ventricular ejection fraction, by visual estimation, is 60 to 65%. The left ventricle has normal function. There is no left ventricular hypertrophy.  2. The left ventricle has no regional wall motion abnormalities.  3. Global right ventricle has normal systolic function.The right ventricular size is normal. No increase in right ventricular wall thickness.  4. Left atrial size was mildly dilated.  5. Right atrial size was normal.  6. The mitral valve is normal in structure. No evidence of mitral valve regurgitation. No evidence of mitral stenosis.  7. The tricuspid valve is normal in structure.  8. The aortic valve is normal in structure. Aortic valve regurgitation is not visualized. No evidence of aortic valve sclerosis or stenosis.  9. The pulmonic valve was normal in structure. Pulmonic valve regurgitation is not visualized. 10. Normal pulmonary artery systolic pressure. 11. The inferior vena cava is normal in size with greater than 50% respiratory variability, suggesting right atrial pressure of 3 mmHg. FINDINGS  Left Ventricle: Left ventricular ejection fraction, by visual estimation, is 60 to 65%. The left ventricle has normal function. The left ventricle has no regional wall motion abnormalities. There is no left ventricular hypertrophy. Left ventricular diastolic parameters were normal. Normal left atrial pressure. Right Ventricle: The right ventricular size is  normal. No increase in right ventricular wall thickness. Global RV systolic function is has normal systolic function. The tricuspid regurgitant velocity is 1.97 m/s, and with an assumed right atrial pressure  of 3 mmHg, the estimated right ventricular systolic pressure is normal at 18.5 mmHg. Left Atrium: Left atrial size was mildly dilated. Right Atrium: Right atrial size was normal in size Pericardium: There is no evidence of pericardial effusion. Mitral Valve: The mitral valve is normal in structure. No evidence of mitral valve regurgitation. No evidence of mitral valve stenosis by observation. Tricuspid Valve: The tricuspid valve is normal in structure. Tricuspid valve regurgitation is not demonstrated. Aortic Valve: The aortic valve is normal in structure. Aortic valve regurgitation is  not visualized. The aortic valve is structurally normal, with no evidence of sclerosis or stenosis. Aortic valve mean gradient measures 5.7 mmHg. Aortic valve peak gradient measures 9.1 mmHg. Aortic valve area, by VTI measures 2.13 cm. Pulmonic Valve: The pulmonic valve was normal in structure. Pulmonic valve regurgitation is not visualized. Pulmonic regurgitation is not visualized. Aorta: The aortic root, ascending aorta and aortic arch are all structurally normal, with no evidence of dilitation or obstruction and the aortic root is normal in size and structure. Venous: The inferior vena cava is normal in size with greater than 50% respiratory variability, suggesting right atrial pressure of 3 mmHg. IAS/Shunts: No atrial level shunt detected by color flow Doppler. There is no evidence of a patent foramen ovale.  LEFT VENTRICLE PLAX 2D LVIDd:         4.56 cm  Diastology LVIDs:         2.75 cm  LV e' lateral:   13.40 cm/s LV PW:         0.81 cm  LV E/e' lateral: 7.2 LV IVS:        0.80 cm  LV e' medial:    9.03 cm/s LVOT diam:     2.00 cm  LV E/e' medial:  10.7 LV SV:         67 ml LV SV Index:   31.46 LVOT Area:     3.14 cm   RIGHT VENTRICLE RV Basal diam:  2.83 cm RV S prime:     16.20 cm/s TAPSE (M-mode): 4.2 cm LEFT ATRIUM             Index       RIGHT ATRIUM           Index LA diam:        4.00 cm 1.98 cm/m  RA Area:     16.90 cm LA Vol (A2C):   50.0 ml 24.73 ml/m RA Volume:   43.50 ml  21.52 ml/m LA Vol (A4C):   76.2 ml 37.69 ml/m LA Biplane Vol: 63.8 ml 31.56 ml/m  AORTIC VALVE                    PULMONIC VALVE AV Area (Vmax):    1.94 cm     PV Vmax:        0.79 m/s AV Area (Vmean):   1.98 cm     PV Peak grad:   2.5 mmHg AV Area (VTI):     2.13 cm     RVOT Peak grad: 5 mmHg AV Vmax:           151.00 cm/s AV Vmean:          105.433 cm/s AV VTI:            0.283 m AV Peak Grad:      9.1 mmHg AV Mean Grad:      5.7 mmHg LVOT Vmax:         93.10 cm/s LVOT Vmean:        66.600 cm/s LVOT VTI:          0.192 m LVOT/AV VTI ratio: 0.68  AORTA Ao Root diam: 2.70 cm MITRAL VALVE                        TRICUSPID VALVE MV Area (PHT): 3.60 cm             TR Peak grad:   15.5 mmHg MV PHT:  61.19 msec           TR Vmax:        197.00 cm/s MV Decel Time: 211 msec MV E velocity: 96.50 cm/s 103 cm/s  SHUNTS MV A velocity: 63.50 cm/s 70.3 cm/s Systemic VTI:  0.19 m MV E/A ratio:  1.52       1.5       Systemic Diam: 2.00 cm  Kate Sable MD Electronically signed by Kate Sable MD Signature Date/Time: 06/07/2019/3:15:49 PM    Final    Reflux (Bilateral)  Result Date: 05/30/2019  Lower Venous Reflux Study Indications: Pain, and Swelling.  Performing Technologist: Almira Coaster RVS  Examination Guidelines: A complete evaluation includes B-mode imaging, spectral Doppler, color Doppler, and power Doppler as needed of all accessible portions of each vessel. Bilateral testing is considered an integral part of a complete examination. Limited examinations for reoccurring indications may be performed as noted. The reflux portion of the exam is performed with the patient in reverse Trendelenburg.   +---------+---------------+---------+-----------+----------+--------------+ RIGHT    CompressibilityPhasicitySpontaneityPropertiesThrombus Aging +---------+---------------+---------+-----------+----------+--------------+ CFV      Full           Yes      Yes                                 +---------+---------------+---------+-----------+----------+--------------+ SFJ      Full           Yes      Yes                                 +---------+---------------+---------+-----------+----------+--------------+ FV Prox  Full           Yes      Yes                                 +---------+---------------+---------+-----------+----------+--------------+ FV Mid   Full           Yes      Yes                                 +---------+---------------+---------+-----------+----------+--------------+ FV DistalFull           Yes      Yes                                 +---------+---------------+---------+-----------+----------+--------------+ PFV      Full           Yes      Yes                                 +---------+---------------+---------+-----------+----------+--------------+ POP      Full           Yes      Yes                                 +---------+---------------+---------+-----------+----------+--------------+ GSV      Full           Yes      Yes                                 +---------+---------------+---------+-----------+----------+--------------+  SSV      Full           Yes      Yes                                 +---------+---------------+---------+-----------+----------+--------------+   +---------+---------------+---------+-----------+----------+--------------+ LEFT     CompressibilityPhasicitySpontaneityPropertiesThrombus Aging +---------+---------------+---------+-----------+----------+--------------+ CFV      Full           Yes      Yes                                  +---------+---------------+---------+-----------+----------+--------------+ SFJ      Full           Yes      Yes                                 +---------+---------------+---------+-----------+----------+--------------+ FV Prox  Full           Yes      Yes                                 +---------+---------------+---------+-----------+----------+--------------+ FV Mid   Full           Yes      Yes                                 +---------+---------------+---------+-----------+----------+--------------+ FV DistalFull           Yes      Yes                                 +---------+---------------+---------+-----------+----------+--------------+ PFV      Full           Yes      Yes                                 +---------+---------------+---------+-----------+----------+--------------+ POP      Full           Yes      Yes                                 +---------+---------------+---------+-----------+----------+--------------+ GSV      Full           Yes      Yes                                 +---------+---------------+---------+-----------+----------+--------------+ SSV      Full           Yes      Yes                                 +---------+---------------+---------+-----------+----------+--------------+   Venous Reflux Times +--------------+------+-----------+------------+--------+ RIGHT         RefluxReflux TimeDiameter cmsComments  Yes                                  +--------------+------+-----------+------------+--------+ GSV at SFJ                         1.07             +--------------+------+-----------+------------+--------+ GSV prox thigh                     .92              +--------------+------+-----------+------------+--------+ GSV mid thigh                      .61              +--------------+------+-----------+------------+--------+ GSV dist thigh                     .51               +--------------+------+-----------+------------+--------+ GSV at knee                        .58              +--------------+------+-----------+------------+--------+ GSV prox calf  yes    1049 ms      .48              +--------------+------+-----------+------------+--------+ SSV Pop Fossa                      .31              +--------------+------+-----------+------------+--------+  +--------------+------+-----------+------------+--------+ LEFT          RefluxReflux TimeDiameter cmsComments                Yes                                  +--------------+------+-----------+------------+--------+ GSV at SFJ                         .88              +--------------+------+-----------+------------+--------+ GSV prox thigh                     .91              +--------------+------+-----------+------------+--------+ GSV mid thigh                      .49              +--------------+------+-----------+------------+--------+ GSV dist thigh                     .45              +--------------+------+-----------+------------+--------+ GSV at knee                        .37              +--------------+------+-----------+------------+--------+ GSV prox calf                      .37              +--------------+------+-----------+------------+--------+  SSV Pop Fossa                      .35              +--------------+------+-----------+------------+--------+   Summary: Right: Evidence of chronic venous insufficiency is detected in the great saphenous vein. There is no evidence of deep vein thrombosis in the lower extremity.There is no evidence of superficial venous thrombosis. Left: There is no evidence of deep vein thrombosis in the lower extremity. There is no evidence of superficial venous thrombosis.There is no evidence of chronic venous insufficiency.  *See table(s) above for measurements and observations. Electronically signed by Leotis Pain  MD on 05/30/2019 at 8:44:17 AM.    Final       Assessment and Plan: Patient Active Problem List   Diagnosis Date Noted  . Acute and chronic respiratory failure with hypoxia ([place]) 06/07/2019  . Aspiration pneumonia ([place]) 06/07/2019  . COPD (chronic obstructive pulmonary disease) ([place]) 06/07/2019  . Streptococcal bacteremia 06/07/2019  . COPD with acute exacerbation ([place]) 06/04/2019  . Swelling of limb 05/16/2019  . Chronic diastolic CHF (congestive heart failure) ([place]) 03/07/2019  . Chronic respiratory failure with hypoxia ([place]) 03/07/2019  . Generalized anxiety disorder 03/02/2019  . Acute on chronic respiratory failure with hypoxia and hypercapnia ([place]) 02/27/2019  . Cervical spondylosis 06/20/2018  . Occipital neuralgia of right side 06/20/2018  . Primary osteoarthritis of first carpometacarpal joint of left hand 06/20/2018  . Closed fracture of distal end of radius 03/02/2018  . DM (diabetes mellitus), type 2 ([place]) 09/08/2016  . COPD exacerbation ([place]) 09/07/2016  . Neck pain 04/02/2016  . Abdominal pain 01/04/2014    1. Chronic Hyercapneic respiratory failure she has had multiple bouts of admissions to the hospital and I think that she may actually benefit from noninvasive ventilation to help assist with her breathing and sustain life and also to reduce her hospitalization events.  She does understand this is not a cure and we may be just prolonging the inevitable. 2. COPD she has severe advanced disease on chronic oxygen therapy she has been on 3 L oxygen but does desaturate quite easily. 3. Hypoxia oxygen dependent needs a ABG follow up 4. Pneumonia needs to get a follow up CXR. 5. CHF as above will get CXR needs to monitor her fluid status closely.  General Counseling: I have discussed the findings of the evaluation and examination with Thayer Headings.  I have also discussed any further diagnostic evaluation thatmay be needed or ordered today. Angelice verbalizes understanding of the  findings of todays visit. We also reviewed her medications today and discussed drug interactions and side effects including but not limited excessive drowsiness and altered mental states. We also discussed that there is always a risk not just to her but also people around her. she has been encouraged to call the office with any questions or concerns that should arise related to todays visit.  Orders Placed This Encounter  Procedures  . DG Chest 2 View    Standing Status:   Future    Standing Expiration Date:   08/16/2020    Order Specific Question:   Reason for Exam (SYMPTOM  OR DIAGNOSIS REQUIRED)    Answer:   pneumonia    Order Specific Question:   Is patient pregnant?    Answer:   No    Order Specific Question:   Preferred imaging location?    Answer:   [facility]    Order Specific  Question:   Radiology Contrast Protocol - do NOT remove file path    Answer:   [email or web address]  . Blood Gas, Arterial  . Bipap    Standing Status:   Future    Standing Expiration Date:   06/18/2020     Time spent: 82min  I have personally obtained a history, examined the patient, evaluated laboratory and imaging results, formulated the assessment and plan and placed orders.    [name], MD [facility] Pulmonary and Critical Care Sleep medicine

## 2019-06-19 NOTE — Patient Instructions (Signed)

## 2019-06-20 ENCOUNTER — Emergency Department: Payer: Medicare Other

## 2019-06-20 ENCOUNTER — Other Ambulatory Visit: Payer: Self-pay

## 2019-06-20 DIAGNOSIS — Z8674 Personal history of sudden cardiac arrest: Secondary | ICD-10-CM | POA: Diagnosis not present

## 2019-06-20 DIAGNOSIS — E872 Acidosis: Secondary | ICD-10-CM | POA: Diagnosis present

## 2019-06-20 DIAGNOSIS — G4730 Sleep apnea, unspecified: Secondary | ICD-10-CM | POA: Diagnosis present

## 2019-06-20 DIAGNOSIS — J441 Chronic obstructive pulmonary disease with (acute) exacerbation: Secondary | ICD-10-CM | POA: Diagnosis present

## 2019-06-20 DIAGNOSIS — J9622 Acute and chronic respiratory failure with hypercapnia: Secondary | ICD-10-CM | POA: Diagnosis present

## 2019-06-20 DIAGNOSIS — I110 Hypertensive heart disease with heart failure: Secondary | ICD-10-CM | POA: Diagnosis present

## 2019-06-20 DIAGNOSIS — I472 Ventricular tachycardia: Secondary | ICD-10-CM | POA: Diagnosis not present

## 2019-06-20 DIAGNOSIS — I4819 Other persistent atrial fibrillation: Secondary | ICD-10-CM | POA: Diagnosis present

## 2019-06-20 DIAGNOSIS — R570 Cardiogenic shock: Secondary | ICD-10-CM | POA: Diagnosis present

## 2019-06-20 DIAGNOSIS — R680 Hypothermia, not associated with low environmental temperature: Secondary | ICD-10-CM | POA: Diagnosis not present

## 2019-06-20 DIAGNOSIS — Z515 Encounter for palliative care: Secondary | ICD-10-CM | POA: Diagnosis not present

## 2019-06-20 DIAGNOSIS — N179 Acute kidney failure, unspecified: Secondary | ICD-10-CM | POA: Diagnosis present

## 2019-06-20 DIAGNOSIS — J989 Respiratory disorder, unspecified: Secondary | ICD-10-CM

## 2019-06-20 DIAGNOSIS — Z66 Do not resuscitate: Secondary | ICD-10-CM | POA: Diagnosis not present

## 2019-06-20 DIAGNOSIS — Z90710 Acquired absence of both cervix and uterus: Secondary | ICD-10-CM

## 2019-06-20 DIAGNOSIS — I214 Non-ST elevation (NSTEMI) myocardial infarction: Principal | ICD-10-CM | POA: Diagnosis present

## 2019-06-20 DIAGNOSIS — I469 Cardiac arrest, cause unspecified: Secondary | ICD-10-CM

## 2019-06-20 DIAGNOSIS — G939 Disorder of brain, unspecified: Secondary | ICD-10-CM

## 2019-06-20 DIAGNOSIS — F411 Generalized anxiety disorder: Secondary | ICD-10-CM | POA: Diagnosis present

## 2019-06-20 DIAGNOSIS — Z79899 Other long term (current) drug therapy: Secondary | ICD-10-CM

## 2019-06-20 DIAGNOSIS — J9690 Respiratory failure, unspecified, unspecified whether with hypoxia or hypercapnia: Secondary | ICD-10-CM | POA: Diagnosis present

## 2019-06-20 DIAGNOSIS — Z87891 Personal history of nicotine dependence: Secondary | ICD-10-CM

## 2019-06-20 DIAGNOSIS — J9621 Acute and chronic respiratory failure with hypoxia: Secondary | ICD-10-CM | POA: Diagnosis present

## 2019-06-20 DIAGNOSIS — E119 Type 2 diabetes mellitus without complications: Secondary | ICD-10-CM | POA: Diagnosis present

## 2019-06-20 DIAGNOSIS — I5032 Chronic diastolic (congestive) heart failure: Secondary | ICD-10-CM | POA: Diagnosis present

## 2019-06-20 DIAGNOSIS — Z20822 Contact with and (suspected) exposure to covid-19: Secondary | ICD-10-CM | POA: Diagnosis present

## 2019-06-20 DIAGNOSIS — G931 Anoxic brain damage, not elsewhere classified: Secondary | ICD-10-CM | POA: Diagnosis present

## 2019-06-20 DIAGNOSIS — J9601 Acute respiratory failure with hypoxia: Secondary | ICD-10-CM | POA: Diagnosis not present

## 2019-06-20 DIAGNOSIS — I468 Cardiac arrest due to other underlying condition: Secondary | ICD-10-CM

## 2019-06-20 DIAGNOSIS — Z7951 Long term (current) use of inhaled steroids: Secondary | ICD-10-CM

## 2019-06-20 LAB — BLOOD GAS, ARTERIAL
Acid-Base Excess: 3.9 mmol/L — ABNORMAL HIGH (ref 0.0–2.0)
Bicarbonate: 34.8 mmol/L — ABNORMAL HIGH (ref 20.0–28.0)
FIO2: 1
MECHVT: 500 mL
O2 Saturation: 99.8 %
PEEP: 5 cmH2O
Patient temperature: 37
RATE: 20 resp/min
pCO2 arterial: 87 mmHg (ref 32.0–48.0)
pH, Arterial: 7.21 — ABNORMAL LOW (ref 7.350–7.450)
pO2, Arterial: 248 mmHg — ABNORMAL HIGH (ref 83.0–108.0)

## 2019-06-20 LAB — RESPIRATORY PANEL BY RT PCR (FLU A&B, COVID)
Influenza A by PCR: NEGATIVE
Influenza B by PCR: NEGATIVE
SARS Coronavirus 2 by RT PCR: NEGATIVE

## 2019-06-20 LAB — COMPREHENSIVE METABOLIC PANEL
ALT: 123 U/L — ABNORMAL HIGH (ref 0–44)
AST: 150 U/L — ABNORMAL HIGH (ref 15–41)
Albumin: 3.5 g/dL (ref 3.5–5.0)
Alkaline Phosphatase: 57 U/L (ref 38–126)
Anion gap: 13 (ref 5–15)
BUN: 22 mg/dL — ABNORMAL HIGH (ref 6–20)
CO2: 33 mmol/L — ABNORMAL HIGH (ref 22–32)
Calcium: 10.7 mg/dL — ABNORMAL HIGH (ref 8.9–10.3)
Chloride: 98 mmol/L (ref 98–111)
Creatinine, Ser: 1.18 mg/dL — ABNORMAL HIGH (ref 0.44–1.00)
GFR calc Af Amer: 60 mL/min — ABNORMAL LOW (ref >60)
GFR calc non Af Amer: 52 mL/min — ABNORMAL LOW (ref >60)
Glucose, Bld: 249 mg/dL — ABNORMAL HIGH (ref 70–99)
Potassium: 5.5 mmol/L — ABNORMAL HIGH (ref 3.5–5.1)
Sodium: 144 mmol/L (ref 135–145)
Total Bilirubin: 0.8 mg/dL (ref 0.3–1.2)
Total Protein: 6.6 g/dL (ref 6.5–8.1)

## 2019-06-20 LAB — CBC WITH DIFFERENTIAL/PLATELET
Abs Immature Granulocytes: 1.07 10*3/uL — ABNORMAL HIGH (ref 0.00–0.07)
Basophils Absolute: 0.2 10*3/uL — ABNORMAL HIGH (ref 0.0–0.1)
Basophils Relative: 1 %
Eosinophils Absolute: 0 10*3/uL (ref 0.0–0.5)
Eosinophils Relative: 0 %
HCT: 55.1 % — ABNORMAL HIGH (ref 36.0–46.0)
Hemoglobin: 16.2 g/dL — ABNORMAL HIGH (ref 12.0–15.0)
Immature Granulocytes: 5 %
Lymphocytes Relative: 12 %
Lymphs Abs: 2.4 10*3/uL (ref 0.7–4.0)
MCH: 29.8 pg (ref 26.0–34.0)
MCHC: 29.4 g/dL — ABNORMAL LOW (ref 30.0–36.0)
MCV: 101.3 fL — ABNORMAL HIGH (ref 80.0–100.0)
Monocytes Absolute: 0.8 10*3/uL (ref 0.1–1.0)
Monocytes Relative: 4 %
Neutro Abs: 16 10*3/uL — ABNORMAL HIGH (ref 1.7–7.7)
Neutrophils Relative %: 78 %
Platelets: 223 10*3/uL (ref 150–400)
RBC: 5.44 MIL/uL — ABNORMAL HIGH (ref 3.87–5.11)
RDW: 15.1 % (ref 11.5–15.5)
WBC: 20.4 10*3/uL — ABNORMAL HIGH (ref 4.0–10.5)
nRBC: 0.3 % — ABNORMAL HIGH (ref 0.0–0.2)

## 2019-06-20 LAB — URINALYSIS, COMPLETE (UACMP) WITH MICROSCOPIC
Bilirubin Urine: NEGATIVE
Glucose, UA: >=500 mg/dL — AB
Hgb urine dipstick: NEGATIVE
Ketones, ur: NEGATIVE mg/dL
Leukocytes,Ua: NEGATIVE
Nitrite: NEGATIVE
Protein, ur: >=300 mg/dL — AB
Specific Gravity, Urine: 1.022 (ref 1.005–1.030)
pH: 5 (ref 5.0–8.0)

## 2019-06-20 LAB — PROTIME-INR
INR: 0.9 (ref 0.8–1.2)
Prothrombin Time: 12.4 seconds (ref 11.4–15.2)

## 2019-06-20 LAB — LIPASE, BLOOD: Lipase: 27 U/L (ref 11–51)

## 2019-06-20 LAB — TROPONIN I (HIGH SENSITIVITY)
Troponin I (High Sensitivity): 27 ng/L — ABNORMAL HIGH (ref <18)
Troponin I (High Sensitivity): 25 ng/L — ABNORMAL HIGH (ref <18)

## 2019-06-20 LAB — GLUCOSE, CAPILLARY: Glucose-Capillary: 197 mg/dL — ABNORMAL HIGH (ref 70–99)

## 2019-06-20 LAB — POC SARS CORONAVIRUS 2 AG -  ED: SARS Coronavirus 2 Ag: NEGATIVE

## 2019-06-20 LAB — MRSA PCR SCREENING: MRSA by PCR: NEGATIVE

## 2019-06-20 LAB — MAGNESIUM: Magnesium: 2.2 mg/dL (ref 1.7–2.4)

## 2019-06-20 LAB — PHOSPHORUS: Phosphorus: 6.8 mg/dL — ABNORMAL HIGH (ref 2.5–4.6)

## 2019-06-20 MED ORDER — SODIUM CHLORIDE 0.9% FLUSH: 3.0000 mL | INTRAVENOUS | Status: DC | PRN

## 2019-06-20 MED ORDER — AMIODARONE HCL 150 MG/3ML IV SOLN
INTRAVENOUS | Status: AC | PRN
  Administered 2019-06-20: 150 mg via INTRAVENOUS

## 2019-06-20 MED ORDER — POLYVINYL ALCOHOL 1.4 % OP SOLN
1.0000 [drp] | Freq: Four times a day (QID) | OPHTHALMIC | Status: DC | PRN
  Filled 2019-06-20: qty 15

## 2019-06-20 MED ORDER — FENTANYL BOLUS VIA INFUSION
50.0000 ug | INTRAVENOUS | Status: DC | PRN
  Filled 2019-06-20: qty 50

## 2019-06-20 MED ORDER — GLYCOPYRROLATE 0.2 MG/ML IJ SOLN: 0.2000 mg | INTRAMUSCULAR | Status: DC | PRN

## 2019-06-20 MED ORDER — SENNOSIDES-DOCUSATE SODIUM 8.6-50 MG PO TABS: 2.0000 | ORAL_TABLET | Freq: Two times a day (BID) | ORAL | Status: DC

## 2019-06-20 MED ORDER — LORAZEPAM 2 MG/ML IJ SOLN
INTRAMUSCULAR | Status: AC
  Filled 2019-06-20: qty 2

## 2019-06-20 MED ORDER — ROCURONIUM BROMIDE 50 MG/5ML IV SOLN
INTRAVENOUS | Status: AC | PRN
  Administered 2019-06-20: 80 mg via INTRAVENOUS

## 2019-06-20 MED ORDER — NOREPINEPHRINE 4 MG/250ML-% IV SOLN
2.0000 ug/min | INTRAVENOUS | Status: DC
  Administered 2019-06-20: 2 ug/min via INTRAVENOUS

## 2019-06-20 MED ORDER — SODIUM CHLORIDE 0.9 % IV BOLUS
1000.0000 mL | Freq: Once | INTRAVENOUS | Status: AC
  Administered 2019-06-20: 1000 mL via INTRAVENOUS

## 2019-06-20 MED ORDER — ENOXAPARIN SODIUM 40 MG/0.4ML ~~LOC~~ SOLN: 40.0000 mg | SUBCUTANEOUS | Status: DC

## 2019-06-20 MED ORDER — HYDROMORPHONE HCL 1 MG/ML IJ SOLN
1.0000 mg | INTRAMUSCULAR | Status: AC
  Administered 2019-06-20: 1 mg via INTRAVENOUS
  Filled 2019-06-20: qty 1

## 2019-06-20 MED ORDER — SODIUM CHLORIDE 0.9 % IV SOLN: 250.0000 mL | INTRAVENOUS | Status: DC | PRN

## 2019-06-20 MED ORDER — PROPOFOL BOLUS VIA INFUSION: 0.5000 mg/kg | Freq: Once | INTRAVENOUS | Status: DC

## 2019-06-20 MED ORDER — MORPHINE BOLUS VIA INFUSION
5.0000 mg | INTRAVENOUS | Status: DC | PRN
  Filled 2019-06-20: qty 5

## 2019-06-20 MED ORDER — FENTANYL 2500MCG IN NS 250ML (10MCG/ML) PREMIX INFUSION
50.0000 ug/h | INTRAVENOUS | Status: DC
  Administered 2019-06-20: 100 ug/h via INTRAVENOUS
  Filled 2019-06-20: qty 250

## 2019-06-20 MED ORDER — FAMOTIDINE IN NACL 20-0.9 MG/50ML-% IV SOLN: 20.0000 mg | Freq: Two times a day (BID) | INTRAVENOUS | Status: DC

## 2019-06-20 MED ORDER — SODIUM CHLORIDE 0.9% FLUSH: 3.0000 mL | Freq: Two times a day (BID) | INTRAVENOUS | Status: DC

## 2019-06-20 MED ORDER — MORPHINE 100MG IN NS 100ML (1MG/ML) PREMIX INFUSION
0.0000 mg/h | INTRAVENOUS | Status: DC
  Administered 2019-06-20: 5 mg/h via INTRAVENOUS
  Filled 2019-06-20: qty 100

## 2019-06-20 MED ORDER — VANCOMYCIN HCL IN DEXTROSE 1-5 GM/200ML-% IV SOLN
1000.0000 mg | Freq: Once | INTRAVENOUS | Status: AC
  Administered 2019-06-20: 1000 mg via INTRAVENOUS
  Filled 2019-06-20: qty 200

## 2019-06-20 MED ORDER — ACETAMINOPHEN 325 MG PO TABS: 650.0000 mg | ORAL_TABLET | Freq: Four times a day (QID) | ORAL | Status: DC | PRN

## 2019-06-20 MED ORDER — SODIUM CHLORIDE 0.9 % IV SOLN: 250.0000 mL | INTRAVENOUS | Status: DC

## 2019-06-20 MED ORDER — FENTANYL CITRATE (PF) 100 MCG/2ML IJ SOLN: 50.0000 ug | Freq: Once | INTRAMUSCULAR | Status: DC

## 2019-06-20 MED ORDER — ONDANSETRON HCL 4 MG/2ML IJ SOLN: 4.0000 mg | Freq: Four times a day (QID) | INTRAMUSCULAR | Status: DC | PRN

## 2019-06-20 MED ORDER — ACETAMINOPHEN 650 MG RE SUPP: 650.0000 mg | Freq: Four times a day (QID) | RECTAL | Status: DC | PRN

## 2019-06-20 MED ORDER — SODIUM BICARBONATE 8.4 % IV SOLN
INTRAVENOUS | Status: AC | PRN
  Administered 2019-06-20: 100 meq via INTRAVENOUS

## 2019-06-20 MED ORDER — SODIUM CHLORIDE 0.9 % IV SOLN
2.0000 g | Freq: Once | INTRAVENOUS | Status: AC
  Administered 2019-06-20: 2 g via INTRAVENOUS
  Filled 2019-06-20: qty 2

## 2019-06-20 MED ORDER — PROPOFOL 1000 MG/100ML IV EMUL
30.0000 ug/kg/min | INTRAVENOUS | Status: DC
  Administered 2019-06-20: 30 ug/kg/min via INTRAVENOUS
  Filled 2019-06-20: qty 100

## 2019-06-20 MED ORDER — MIDAZOLAM HCL 2 MG/2ML IJ SOLN: 2.0000 mg | INTRAMUSCULAR | Status: DC | PRN

## 2019-06-20 MED ORDER — ACETAMINOPHEN 325 MG PO TABS: 650.0000 mg | ORAL_TABLET | ORAL | Status: DC | PRN

## 2019-06-20 MED ORDER — MIDAZOLAM HCL 2 MG/2ML IJ SOLN
2.0000 mg | INTRAMUSCULAR | Status: AC | PRN
  Administered 2019-06-20 (×3): 2 mg via INTRAVENOUS
  Filled 2019-06-20 (×3): qty 2

## 2019-06-20 MED ORDER — GLYCOPYRROLATE 1 MG PO TABS
1.0000 mg | ORAL_TABLET | ORAL | Status: DC | PRN
  Filled 2019-06-20: qty 1

## 2019-06-20 MED ORDER — EPINEPHRINE 1 MG/10ML IJ SOSY
PREFILLED_SYRINGE | INTRAMUSCULAR | Status: AC | PRN
  Administered 2019-06-20 (×3): 1 mg via INTRAVENOUS

## 2019-06-20 MED ORDER — CALCIUM CHLORIDE 10 % IV SOLN
INTRAVENOUS | Status: AC | PRN
  Administered 2019-06-20: 1 g via INTRAVENOUS

## 2019-06-20 MED ORDER — DIPHENHYDRAMINE HCL 50 MG/ML IJ SOLN: 25.0000 mg | INTRAMUSCULAR | Status: DC | PRN

## 2019-06-20 MED ORDER — MORPHINE SULFATE (PF) 2 MG/ML IV SOLN
2.0000 mg | INTRAVENOUS | Status: DC | PRN
  Administered 2019-06-20: 2 mg via INTRAVENOUS
  Filled 2019-06-20: qty 1

## 2019-06-20 MED ORDER — DEXTROSE 5 % IV SOLN: INTRAVENOUS | Status: DC

## 2019-06-21 ENCOUNTER — Ambulatory Visit (INDEPENDENT_AMBULATORY_CARE_PROVIDER_SITE_OTHER): Payer: Medicare Other | Admitting: Nurse Practitioner

## 2019-06-21 ENCOUNTER — Encounter (INDEPENDENT_AMBULATORY_CARE_PROVIDER_SITE_OTHER): Payer: Medicare Other

## 2019-06-21 LAB — URINE CULTURE: Culture: NO GROWTH

## 2019-06-25 LAB — CULTURE, BLOOD (ROUTINE X 2)
Culture: NO GROWTH
Culture: NO GROWTH
Special Requests: ADEQUATE
Special Requests: ADEQUATE

## 2019-07-10 NOTE — Progress Notes (Signed)
Chaplain provided spiritual and emotional support before, during and following extubation. Provided family end of life education through out process. Family is grieving well and worked well together. Carollee Sires took patients coat and necklace. Cheri Rous will call back with funeral home. Chase was verbally thankful for care provided through out the day.     2019-07-08 2100  Clinical Encounter Type  Visited With Patient and family together  Visit Type Patient actively dying;Death  Referral From Nurse  Spiritual Encounters  Spiritual Needs Grief support;Emotional

## 2019-07-10 NOTE — Progress Notes (Signed)
Family is in the process of contacting extended family members. The plan is to transition to comfort care measures once all of the family has been updated.

## 2019-07-10 NOTE — Progress Notes (Signed)
PHARMACY -  BRIEF ANTIBIOTIC NOTE   Pharmacy has received consult(s) for Vancomycin and cefepime from an ED provider.  The patient's profile has been reviewed for ht/wt/allergies/indication/available labs.    One time order(s) placed by MD for Vancomycin 1 gm and Cefepime 2 gm  Further antibiotics/pharmacy consults should be ordered by admitting physician if indicated.                       Thank you, Ariyanna Oien A Jul 07, 2019  10:03 AM

## 2019-07-10 NOTE — ED Provider Notes (Signed)
[facility] Emergency Department Provider Note  ____________________________________________  Time seen: Approximately 10:44 AM  I have reviewed the triage vital signs and the nursing notes.   HISTORY  Chief Complaint Respiratory Distress  Level 5 Caveat: Portions of the History and Physical including HPI and review of systems are unable to be completely obtained due to patient being comatose on arrival   HPI Shelly [name] is a 59 y.o. female brought to the ED by EMS from home due to shortness of breath.  EMS report that initial oxygen saturation was in the 50s, they placed the patient on nonrebreather which improved oxygenation to the 80s.  Patient unable to provide any history due to agonal respirations on arrival and subsequent cardiac arrest upon entering the treatment room.      Past Medical History:  Diagnosis Date  . Asthma   . CHF (congestive heart failure) ([place])   . COPD (chronic obstructive pulmonary disease) ([place])   . Diabetes mellitus without complication ([place])   . Hypertension   . Sleep apnea      Patient Active Problem List   Diagnosis Date Noted  . Respiratory failure ([place]) Jul 15, 2019  . Acute and chronic respiratory failure with hypoxia ([place]) 06/07/2019  . Aspiration pneumonia ([place]) 06/07/2019  . COPD (chronic obstructive pulmonary disease) ([place]) 06/07/2019  . Streptococcal bacteremia 06/07/2019  . COPD with acute exacerbation ([place]) 06/04/2019  . Swelling of limb 05/16/2019  . Chronic diastolic CHF (congestive heart failure) ([place]) 03/07/2019  . Chronic respiratory failure with hypoxia ([place]) 03/07/2019  . Generalized anxiety disorder 03/02/2019  . Acute on chronic respiratory failure with hypoxia and hypercapnia ([place]) 02/27/2019  . Cervical spondylosis 06/20/2018  . Occipital neuralgia of right side 06/20/2018  . Primary osteoarthritis of first carpometacarpal joint of left hand 06/20/2018  . Closed fracture of distal end of  radius 03/02/2018  . DM (diabetes mellitus), type 2 ([place]) 09/08/2016  . COPD exacerbation ([place]) 09/07/2016  . Neck pain 04/02/2016  . Abdominal pain 01/04/2014     Past Surgical History:  Procedure Laterality Date  . ABDOMINAL HYSTERECTOMY    . COLON SURGERY       Prior to Admission medications   Medication Sig Start Date End Date Taking? Authorizing Provider  albuterol (VENTOLIN HFA) 108 (90 Base) MCG/ACT inhaler Inhale 2 puffs into the lungs every 6 (six) hours as needed for wheezing or shortness of breath. 03/06/19   Fritzi Mandes, MD  baclofen (LIORESAL) 10 MG tablet Take 10 mg by mouth 3 (three) times daily. 01/13/19   [provider]  busPIRone (BUSPAR) 10 MG tablet Take 10 mg by mouth 2 (two) times daily. 04/14/19   [provider]  clonazePAM (KLONOPIN) 0.5 MG tablet Take 1 tablet (0.5 mg total) by mouth 2 (two) times daily. 06/19/19   [name], MD  DULoxetine (CYMBALTA) 30 MG capsule Take 1 capsule (30 mg total) by mouth 2 (two) times daily. 03/03/19   Max Sane, MD  fluticasone furoate-vilanterol (BREO ELLIPTA) 200-25 MCG/INH AEPB Inhale 1 puff into the lungs daily. 09/09/16   Fritzi Mandes, MD  furosemide (LASIX) 40 MG tablet Take 40 mg by mouth 2 (two) times daily. 04/05/19   [provider]  ipratropium-albuterol (DUONEB) 0.5-2.5 (3) MG/3ML SOLN Inhale 3 mLs into the lungs 4 (four) times daily. 03/20/19 03/14/20  [provider]  lisinopril (ZESTRIL) 20 MG tablet Take 20 mg by mouth daily. 01/04/19   [provider]     Allergies  Patient has no known allergies.   Family History  Adopted: Yes  Family history unknown: Yes    Social History Social History   Tobacco Use  . Smoking status: Former Research scientist (life sciences)  . Smokeless tobacco: Never Used  Substance Use Topics  . Alcohol use: No  . Drug use: No    Review of Systems Level 5 Caveat: Portions of the History and Physical including HPI and review of systems are unable to be  completely obtained due to patient being comatose on arrival  Constitutional:   No fever.  Respiratory:   Positive shortness of breath. Gastrointestinal:   No known vomiting or diarrhea   ____________________________________________   PHYSICAL EXAM:  VITAL SIGNS: ED Triage Vitals  Enc Vitals Group     BP 2019/06/25 0845 134/80     Pulse Rate June 25, 2019 0845 (!) 139     Resp 06/25/2019 0845 (!) 7     Temp --      Temp src --      SpO2 2019-06-25 0845 100 %     Weight 2019-06-25 0854 249 lb 1.9 oz (113 kg)     Height 06-25-19 0854 5\' 5"  (1.651 m)     Head Circumference --      Peak Flow --      Pain Score --      Pain Loc --      Pain Edu? --      Excl. in [place]? --     Vital signs reviewed, nursing assessments reviewed.   Constitutional: Comatose, agonal respirations Eyes:   Conjunctivae are normal.  PERRL, dilated to 5 mm bilaterally. ENT      Head:   Normocephalic and atraumatic.      Nose:   No epistaxis or deformity      Mouth/Throat:   Brown emesis in the oropharynx      Neck:   No meningismus. Full ROM. Hematological/Lymphatic/Immunilogical:   No cervical lymphadenopathy. Cardiovascular: Pulseless.  Mottled skin. Respiratory:   Agonal respirations. Gastrointestinal:   Soft and nontender.  Moderately distended. There is no CVA tenderness.  No rebound, rigidity, or guarding.  Musculoskeletal:   Normal range of motion in all extremities. No joint effusions.  No lower extremity tenderness.  No edema. Neurologic:   GCS 3 Skin:    Skin is warm, dry and intact. No rash noted.  No petechiae, purpura, or bullae.  ____________________________________________    LABS (pertinent positives/negatives) (all labs ordered are listed, but only abnormal results are displayed) Labs Reviewed  CBC WITH DIFFERENTIAL/PLATELET - Abnormal; Notable for the following components:      Result Value   WBC 20.4 (*)    RBC 5.44 (*)    Hemoglobin 16.2 (*)    HCT 55.1 (*)    MCV 101.3 (*)     MCHC 29.4 (*)    nRBC 0.3 (*)    Neutro Abs 16.0 (*)    Basophils Absolute 0.2 (*)    Abs Immature Granulocytes 1.07 (*)    All other components within normal limits  BLOOD GAS, ARTERIAL - Abnormal; Notable for the following components:   pH, Arterial 7.21 (*)    pCO2 arterial 87 (*)    pO2, Arterial 248 (*)    Bicarbonate 34.8 (*)    Acid-Base Excess 3.9 (*)    All other components within normal limits  COMPREHENSIVE METABOLIC PANEL - Abnormal; Notable for the following components:   Potassium 5.5 (*)    CO2 33 (*)  Glucose, Bld 249 (*)    BUN 22 (*)    Creatinine, Ser 1.18 (*)    Calcium 10.7 (*)    AST 150 (*)    ALT 123 (*)    GFR calc non Af Amer 52 (*)    GFR calc Af Amer 60 (*)    All other components within normal limits  URINALYSIS, COMPLETE (UACMP) WITH MICROSCOPIC - Abnormal; Notable for the following components:   Color, Urine YELLOW (*)    APPearance CLEAR (*)    Glucose, UA >=500 (*)    Protein, ur >=300 (*)    Bacteria, UA RARE (*)    All other components within normal limits  TROPONIN I (HIGH SENSITIVITY) - Abnormal; Notable for the following components:   Troponin I (High Sensitivity) 27 (*)    All other components within normal limits  RESPIRATORY PANEL BY RT PCR (FLU A&B, COVID)  CULTURE, BLOOD (ROUTINE X 2)  CULTURE, BLOOD (ROUTINE X 2)  URINE CULTURE  MRSA PCR SCREENING  LIPASE, BLOOD  PROTIME-INR  MAGNESIUM  PHOSPHORUS  POC SARS CORONAVIRUS 2 AG -  ED  TROPONIN I (HIGH SENSITIVITY)   ____________________________________________   EKG  Initial EKG obtained at 8:41 AM after resuscitation with CPR, interpreted by me. (Note this EKG had to be scanned due to monitor initially being assigned to a different patient's name.  ) Sinus rhythm rate of 76, rightward axis.  Normal QRS.  ST elevation in inferior leads II, III, aVF and possibly in V3 and V4.  Concerning for STEMI.  Repeat EKG performed at 8:42 AM interpreted by me (note this EKG had to  be scanned due to monitor initially being assigned to a different patient's name.) Rapid atrial fibrillation, rate of 179.  Normal axis, normal intervals.  4 beat run of nonsustained V. tach and one additional PVC on the strip.  Appears monomorphic  Third EKG obtained at 8:45 AM, after synchronized cardioversion due to rapid A. fib with hypotension Right axis, sinus tachycardia rate 148.  Normal QRS ST segments and T waves.  Fourth EKG obtained at 8:56 AM, after amiodarone 150 mg bolus given Sinus tachycardia rate 121, normal axis intervals QRS ST segments and T waves.   ____________________________________________    RADIOLOGY  DG Chest 2 View  Result Date: 06/19/2019 CLINICAL DATA:  Follow-up pneumonia. Shortness of breath. Productive cough. Asthma. EXAM: CHEST - 2 VIEW COMPARISON:  06/04/2019 FINDINGS: The heart size and mediastinal contours are within normal limits. Endotracheal tube is been removed since previous study. Aortic atherosclerosis incidentally noted. Scarring in right middle lobe remains stable. Both lungs are otherwise clear. Previously noted opacity in left lung base is no longer visualized. The visualized skeletal structures are unremarkable. IMPRESSION: Right middle lobe scarring.  No active cardiopulmonary disease. Electronically Signed   By: Marlaine Hind M.D.   On: 06/19/2019 15:19   DG Abdomen 1 View  Result Date: June 26, 2019 CLINICAL DATA:  OG tube placement. EXAM: ABDOMEN - 1 VIEW COMPARISON:  Single-view of the chest earlier today. FINDINGS: OG tube has been advanced since the prior exam. The tip of the tube is now in the stomach with the side port at the gastroesophageal junction. Recommend advancement of 4 cm. IMPRESSION: OG tube side port is at the gastroesophageal junction. Recommend advancement of 4 cm. Electronically Signed   By: Inge Rise M.D.   On: June 26, 2019 09:33   DG Chest Portable 1 View  Result Date: 2019/06/26 CLINICAL DATA:  Status post  intubation and OG tube placement today. EXAM: PORTABLE CHEST 1 VIEW COMPARISON:  Single-view of the chest 06/19/2019. FINDINGS: OG tube is in place with its tip in the distal esophagus. The tube should be advanced approximately 15 cm for positioning of the side-port in the stomach. New ET tube is also in place with its tip in good position 4.9 cm above the carina. Defibrillator pads are noted. There is coarsening of the pulmonary interstitium. No consolidative process, pneumothorax or effusion. Small focus of linear scar in the right middle lobe again seen. Heart size is normal. Atherosclerosis noted. IMPRESSION: ETT in good position. OG tube tip is in the distal esophagus. Please see report of dedicated plain film of the abdomen is same day. No acute disease. Electronically Signed   By: Inge Rise M.D.   On: 03-Jul-2019 09:31    ____________________________________________   PROCEDURES .Critical Care Performed by: Carrie Mew, MD Authorized by: Carrie Mew, MD   Critical care provider statement:    Critical care time (minutes):  50   Critical care time was exclusive of:  Separately billable procedures and treating other patients   Critical care was necessary to treat or prevent imminent or life-threatening deterioration of the following conditions:  Respiratory failure, CNS failure or compromise, cardiac failure and shock   Critical care was time spent personally by me on the following activities:  Development of treatment plan with patient or surrogate, discussions with consultants, evaluation of patient's response to treatment, examination of patient, obtaining history from patient or surrogate, ordering and performing treatments and interventions, ordering and review of laboratory studies, ordering and review of radiographic studies, pulse oximetry, re-evaluation of patient's condition and review of old charts Comments:        Procedure Name: Intubation Date/Time: 07-03-2019  11:09 AM Performed by: Carrie Mew, MD Pre-anesthesia Checklist: Patient identified, Patient being monitored, Emergency Drugs available, Timeout performed and Suction available Oxygen Delivery Method: Non-rebreather mask Preoxygenation: Pre-oxygenation with 100% oxygen Induction Type: Rapid sequence Ventilation: Mask ventilation without difficulty Laryngoscope Size: Glidescope and 3 Grade View: Grade I Tube type: Subglottic suction tube Tube size: 7.5 mm Number of attempts: 1 Placement Confirmation: ETT inserted through vocal cords under direct vision,  CO2 detector and Breath sounds checked- equal and bilateral Secured at: 23 cm Tube secured with: ETT holder Dental Injury: Teeth and Oropharynx as per pre-operative assessment  Comments:       CPR  Date/Time: 07/03/2019 11:10 AM Performed by: Carrie Mew, MD Authorized by: Carrie Mew, MD  CPR Procedure Details:      Amount of time prior to administration of ACLS/BLS (minutes):  10   ACLS/BLS initiated by EMS: No     CPR/ACLS performed in the ED: Yes     Duration of CPR (minutes):  8   Outcome: ROSC obtained    CPR performed via ACLS guidelines under my direct supervision.  See RN documentation for details including defibrillator use, medications, doses and timing. Comments:       .Cardioversion  Date/Time: 07/03/19 11:11 AM Performed by: Carrie Mew, MD Authorized by: Carrie Mew, MD   Consent:    Consent obtained:  Emergent situation Pre-procedure details:    Cardioversion basis:  Emergent   Rhythm:  Atrial fibrillation   Electrode placement:  Anterior-posterior Patient sedated: No Attempt one:    Cardioversion mode:  Synchronous   Waveform:  Biphasic   Shock (Joules):  200   Shock outcome:  Conversion to normal sinus rhythm Post-procedure details:  Patient status:  Unresponsive   Patient tolerance of procedure:  Tolerated well, no immediate complications Comments:      Patient had recently been in cardiac arrest.  Upon obtaining return of spontaneous circulation, patient developed rapid atrial fibrillation with a rate of 180 and hypertension necessitating cardioversion.  As patient remained comatose, did not require any sedation for this procedure.      ____________________________________________  DIFFERENTIAL DIAGNOSIS   Aspiration pneumonia, COPD exacerbation with CO2 narcosis, pulmonary edema, Covid pneumonia, pulmonary embolism, CHF exacerbation, ACS, intracranial hemorrhage  CLINICAL IMPRESSION / ASSESSMENT AND PLAN / ED COURSE  Medications ordered in the ED: Medications  propofol (DIPRIVAN) 1000 MG/100ML infusion (40 mcg/kg/min  113 kg Intravenous Rate/Dose Change 2019-07-06 1049)  propofol (DIPRIVAN) bolus via infusion 56.5 mg (56.5 mg Intravenous Not Given Jul 06, 2019 0904)  0.9 %  sodium chloride infusion (has no administration in time range)  norepinephrine (LEVOPHED) 4mg  in 242mL premix infusion (8 mcg/min Intravenous Rate/Dose Change Jul 06, 2019 1106)  vancomycin (VANCOCIN) IVPB 1000 mg/200 mL premix (1,000 mg Intravenous New Bag/Given 06-Jul-2019 1052)  ceFEPIme (MAXIPIME) 2 g in sodium chloride 0.9 % 100 mL IVPB (2 g Intravenous New Bag/Given 2019-07-06 1045)  sodium chloride flush (NS) 0.9 % injection 3 mL (3 mLs Intravenous Not Given 2019-07-06 1033)  sodium chloride flush (NS) 0.9 % injection 3 mL (has no administration in time range)  0.9 %  sodium chloride infusion (has no administration in time range)  acetaminophen (TYLENOL) tablet 650 mg (has no administration in time range)  ondansetron (ZOFRAN) injection 4 mg (has no administration in time range)  famotidine (PEPCID) IVPB 20 mg premix (has no administration in time range)  enoxaparin (LOVENOX) injection 40 mg (has no administration in time range)  fentaNYL (SUBLIMAZE) injection 50 mcg (has no administration in time range)  fentaNYL 2576mcg in NS 285mL (59mcg/ml) infusion-PREMIX (has no  administration in time range)  fentaNYL (SUBLIMAZE) bolus via infusion 50 mcg (has no administration in time range)  midazolam (VERSED) injection 2 mg (has no administration in time range)  midazolam (VERSED) injection 2 mg (has no administration in time range)  senna-docusate (Senokot-S) tablet 2 tablet (has no administration in time range)  sodium chloride 0.9 % bolus 1,000 mL (0 mLs Intravenous Stopped 07/06/19 1000)  HYDROmorphone (DILAUDID) injection 1 mg (1 mg Intravenous Given 07-06-2019 0909)  EPINEPHrine (ADRENALIN) 1 MG/10ML injection (1 mg Intravenous Given 07-06-19 0837)  amiodarone (CORDARONE) injection (150 mg Intravenous Given Jul 06, 2019 0843)  calcium chloride injection (1 g Intravenous Given Jul 06, 2019 0833)  sodium bicarbonate injection (100 mEq Intravenous Given 2019/07/06 0834)  rocuronium (ZEMURON) injection (80 mg Intravenous Given 06-Jul-2019 0837)  sodium chloride 0.9 % bolus 1,000 mL (1,000 mLs Intravenous New Bag/Given 07/06/19 1030)    Pertinent labs & imaging results that were available during my care of the patient were reviewed by me and considered in my medical decision making (see chart for details).  Shelly [name] was evaluated in Emergency Department on 07/06/2019 for the symptoms described in the history of present illness. She was evaluated in the context of the global COVID-19 pandemic, which necessitated consideration that the patient might be at risk for infection with the SARS-CoV-2 virus that causes COVID-19. Institutional protocols and algorithms that pertain to the evaluation of patients at risk for COVID-19 are in a state of rapid change based on information released by regulatory bodies including the CDC and federal and state organizations. These policies and algorithms were followed during the patient's care in the  ED.   Patient presents in extremis, agonal breathing on arrival and subsequently went into cardiac arrest on arrival to the treatment room.  See code flow  sheet for further details.  Witnessed arrest.  CPR started immediately.  Patient given approximately 5 minutes of CPR and 2 rounds of epinephrine prior to patient being on monitor and initial rhythm check.  Initial rhythm found to be asystole.  Patient also given an initial amp of bicarb and calcium chloride.  Oxygen saturation with CPR and bag-valve-mask ventilation in the 90s.  Airway was suctioned, patient intubated emergently with rocuronium only (due to agonal respiratory movement), no sedative.  Intubation uncomplicated.  Shortly after intubation, patient had return of spontaneous circulation.  EKG obtained immediately was concerning for STEMI so cardiology was activated who immediately responded to bedside.  Patient subsequently went into a rapid atrial fibrillation with hypotension, so synchronized cardioversion was attempted 1 time which did improve blood pressure and slow heart rate, but due to frequent ectopy and irregular rhythm, amiodarone bolus of 150 mg was given.  Clinical Course as of Jun 19 1110  06/23/2019  [id number] D/w Cards Dr.  Saunders Revel in ED after his eval. 4th ekg shows sinus rhythm, no STEMI.  Initial slight STE likely due to resus efforts. Will cancel STEMI for now pending workup.   [PS]  [id number] Will start empiric abx for hcap. Doubt sepsis. Suspect aspiration vs co2 narcosis as cause of resp. arrest  WBC(!): 20.4 [PS]  0955 Developing hypotension despite 1 L saline bolus.  I will start a second liter and start Levophed.   [PS]  [phone] Given ambiguity of presentation, I will send for CT scan of the head, CT angiogram of the chest to evaluate for PE, CT scan of the abdomen pelvis.   [PS]  1004 Review of chart and imaging data from her last hospitalization does show that she recently had rapidly declining respiratory distress requiring intubation, suspected to be due to CO2 narcosis and aspiration at that time.   [PS]  1014 Still waiting for lab data   [PS]    Clinical Course  User Index [PS] Carrie Mew, MD    ----------------------------------------- 10:55 AM on 06-23-2019 -----------------------------------------  Case has been discussed with ICU Dr. Mortimer Fries, has bed assignment in the ICU.  Will defer targeted temperature management to intensivist. Repeat exam shows good air movement bilaterally.  There is diffuse expiratory wheezing but no air trapping.  Bilious fluid being suctioned from OG tube.  Cap refill 3 seconds.  Temp 97.1 degrees.  Sinus rhythm with a rate of 100, blood pressure of 110/70 on Levophed.    Presentation currently most consistent with COPD exacerbation, CO2 narcosis, possible aspiration and respiratory arrest  ____________________________________________   FINAL CLINICAL IMPRESSION(S) / ED DIAGNOSES    Final diagnoses:  Acute respiratory failure with hypoxia and hypercapnia (HCC)  Cardiac arrest due to respiratory disorder ([place])  COPD exacerbation [facility])     ED Discharge Orders    None      Portions of this note were generated with dragon dictation software. Dictation errors may occur despite best attempts at proofreading.   Carrie Mew, MD 2019/06/23 1114

## 2019-07-10 NOTE — Significant Event (Signed)
[facility] STEMI Evaluation  Late Entry  Patient seen and examined at 08:45 on 07/06/2019.  She was unresponsive after having cardiac arrest in the ED.  Per Dr. Joni Fears, initial rhythm after Zoll was applied was asystole.  She ultimately had ROSC following CPR and developed atrial fibrillation.  Inferior ST elevation was noted on initial EKG. Subsequent EKG's showed atrial fibrillation with rapid ventricular response and resolution of ST elevation.  The patient underwent DCCV by Dr. Joni Fears and was also given IV amiodarone.  At the time of my evaluation, she was intubated and unresponsive.  Heart sounds were distant but tachycardic and regular.  Trace diffuse edema was noted.  EKG at 08:56 showed sinus tachycardia with right axis deviation and non-specific EKG changes.  EKG did not meet STEMI criteria.  Given cardiac arrest with initial rhythm of asystole and multiple repeat EKGs not meeting STEMI criteria, the decision was made to defer emergent cardiac catheterization.  I suspect initial ST elevation was due to demand ischemia in the setting of cardiac arrest.  Findings and recommendations were discussed in person with Dr. Joni Fears.  Nelva Bush, MD [facility]

## 2019-07-10 NOTE — Code Documentation (Signed)
Pulse Check performed at this time; return of pulse at this time.  CPR stopped at this time.

## 2019-07-10 NOTE — ED Notes (Signed)
Per Cardiology, STEMI canceled at this time.

## 2019-07-10 NOTE — ED Notes (Signed)
Cardiology at bedside.

## 2019-07-10 NOTE — Death Summary Note (Signed)
DEATH SUMMARY   Patient Details  Name: Shelly [name] MRN: [id number] DOB: Nov 30, 1960  Admission/Discharge Information   Admit Date:  07-14-2019  Date of Death: Date of Death: 07-14-19  Time of Death: Time of Death: 09-24-06  Length of Stay: 0  Referring Physician: [name], NP   Reason(s) for Hospitalization  Cardiac arrest Acute hypoxic and hypercapnic respiratory failure COPD exacerbation Cardiogenic shock Acute kidney injury  Diagnoses  Preliminary cause of death:   Cardiac arrest Secondary Diagnoses (including complications and co-morbidities):  Active Problems:   Respiratory failure (HCC) Acute hypoxic and hypercapnic respiratory failure COPD exacerbation Cardiogenic shock Acute kidney injury   Brief Hospital Course (including significant findings, care, treatment, and services provided and events leading to death)  Shelly [name] is a 59 y.o. year old female  with a history of COPD, diastolic heart failure, DMT2, GAD and cervical spondylosis.   She had a recent hospitalization from 06/04/2019 to 06/08/2019 for acute respiratory failure due to COPD exacerbation.  She was seen as an outpatient by Dr. Humphrey Rolls yesterday for a COPD exacerbation, pneumonia and CHF.  Today, she developed worsening SOB, and presented to ED via EMS.  Upon arrival, patient was exhibiting agonal respirations and went into cardiac arrest upon entering treatment room.  CPR was initiated.  Patient received 5 minutes of CPR and 2 rounds of epinephrine.  ED workup included EKG with ST elevation in leads II, III, and aVF concerning for possible inferior STEMI.  Troponin 27.  Leukocytosis at 20.4.  PH 7.21.  PCO2 87.  Bicarbonate 34.8.  CXR demonstrated linear scar in right middle lobe, but no evidence of lung infiltrates.   She was admitted to ICU for further work-up and treatment of cardiac arrest, acute hypoxic hypercapnic respiratory failure, severe cardiogenic shock, and multiorgan failure.  After  arrival to ICU he was noted to have seizure-like activity and signs and symptoms of anoxic injury.  She was also already hypothermic with temperature of 36 degrees, therefore she was not a candidate for hypothermia protocol.    Given her multiorgan failure and poor prognosis, the patient's family decided to transition to comfort care measures and withdraw all life-sustaining measures in the evening on 14-Jul-2019.  Pertinent Labs and Studies  Significant Diagnostic Studies DG Chest 2 View  Result Date: 06/19/2019 CLINICAL DATA:  Follow-up pneumonia. Shortness of breath. Productive cough. Asthma. EXAM: CHEST - 2 VIEW COMPARISON:  06/04/2019 FINDINGS: The heart size and mediastinal contours are within normal limits. Endotracheal tube is been removed since previous study. Aortic atherosclerosis incidentally noted. Scarring in right middle lobe remains stable. Both lungs are otherwise clear. Previously noted opacity in left lung base is no longer visualized. The visualized skeletal structures are unremarkable. IMPRESSION: Right middle lobe scarring.  No active cardiopulmonary disease. Electronically Signed   By: Marlaine Hind M.D.   On: 06/19/2019 15:19   DG Abdomen 1 View  Result Date: July 14, 2019 CLINICAL DATA:  OG tube placement. EXAM: ABDOMEN - 1 VIEW COMPARISON:  Single-view of the chest earlier today. FINDINGS: OG tube has been advanced since the prior exam. The tip of the tube is now in the stomach with the side port at the gastroesophageal junction. Recommend advancement of 4 cm. IMPRESSION: OG tube side port is at the gastroesophageal junction. Recommend advancement of 4 cm. Electronically Signed   By: Inge Rise M.D.   On: Jul 14, 2019 09:33   DG Abd 1 View  Result Date: 06/05/2019 CLINICAL DATA:  OG tube placement  EXAM: ABDOMEN - 1 VIEW COMPARISON:  Radiograph 05/01/2011, CT a chest 05/05/2019 FINDINGS: A transesophageal tube tip and side port terminate in the region of the gastric body,  beyond the GE junction. Pacer pads overlie the chest. Additional support devices including catheter tubing and telemetry leads overlie the upper abdomen. Bowel gas pattern is unremarkable. Persisting consolidation partially obscuring the left hemidiaphragm. Additional atelectatic changes in the right lung base. IMPRESSION: 1. A transesophageal tube tip and side port terminate in the region of the gastric body, beyond the GE junction. 2. Persisting consolidation partially obscuring the left hemidiaphragm. Electronically Signed   By: Lovena Le M.D.   On: 06/05/2019 00:40   CT Head Wo Contrast  Result Date: 06/04/2019 CLINICAL DATA:  Acute hypoxic respiratory failure, altered mental status EXAM: CT HEAD WITHOUT CONTRAST TECHNIQUE: Contiguous axial images were obtained from the base of the skull through the vertex without intravenous contrast. COMPARISON:  CT head 02/27/2019 FINDINGS: Brain: No evidence of acute infarction, hemorrhage, hydrocephalus, extra-axial collection or mass lesion/mass effect. Vascular: Atherosclerotic calcification of the carotid siphons. No hyperdense vessel. Skull: No calvarial fracture or suspicious osseous lesion. No scalp swelling or hematoma. Sinuses/Orbits: Paranasal sinuses and mastoid air cells are predominantly clear. Included orbital structures are unremarkable. Other: None IMPRESSION: No acute intracranial abnormality. Electronically Signed   By: Lovena Le M.D.   On: 06/04/2019 22:59   CT Angio Chest PE W/Cm &/Or Wo Cm  Result Date: 06/04/2019 CLINICAL DATA:  Acute hypoxic respiratory failure, negative COVID-19 testing EXAM: CT ANGIOGRAPHY CHEST WITH CONTRAST TECHNIQUE: Multidetector CT imaging of the chest was performed using the standard protocol during bolus administration of intravenous contrast. Multiplanar CT image reconstructions and MIPs were obtained to evaluate the vascular anatomy. CONTRAST:  30mL OMNIPAQUE IOHEXOL 350 MG/ML SOLN COMPARISON:  Chest CTA  04/20/2019 FINDINGS: Cardiovascular: Satisfactory opacification the pulmonary arteries to the segmental level. No pulmonary artery filling defects are identified. Central pulmonary arteries remain normal caliber. Small amount of gas in the right atrium and central pulmonary artery as well as in the superficial veins of the upper chest. Finding is likely iatrogenic and related to intravenous access. Cardiac size at the upper limits of normal. No pericardial effusion. Coronary artery calcifications are minimal. Atherosclerotic plaque within the normal caliber aorta. Normal 3 vessel branching of the aortic arch. Minimal calcifications in the proximal great vessels. Mediastinum/Nodes: Thyroid and thoracic inlet is unremarkable. No mediastinal, hilar or axillary adenopathy. The thoracic esophagus is patulous and fluid-filled. A transesophageal tube terminates below the level of imaging with the side port beyond the GE junction. Patient is intubated at this time with the endotracheal tip approximately 3.4 cm from the carina. Lungs/Pleura: There are several bandlike areas of opacity similar to comparison likely reflective of scarring/architectural distortion. Some dependent consolidated lung in the left base with volume loss, much of which is likely atelectatic however some mixed hypoattenuation of the consolidative lung could suggest underlying infection or aspiration. Upper Abdomen: Trace amount of perihepatic ascites. Stable appearance of the bilateral adrenal adenomas. Musculoskeletal: No acute osseous abnormality or suspicious osseous lesion. Mild exaggerated thoracic kyphosis with multilevel discogenic change. Review of the MIP images confirms the above findings. IMPRESSION: 1. Negative for acute pulmonary embolus. 2. Small amount of gas in the right atrium and central pulmonary artery as well as in the superficial veins of the upper chest. Finding is likely iatrogenic and related to intravenous access. 3.  Consolidated lung in the left base with volume loss, much of  which is likely atelectatic however some mixed hypoattenuation of the consolidative lung could suggest underlying infection or aspiration particularly given a patulous, fluid-filled esophagus despite NG placement. 4. Stable bilateral adrenal adenomas. 5. Trace amount of perihepatic ascites, new from prior. 6. Aortic Atherosclerosis (ICD10-I70.0). Electronically Signed   By: Lovena Le M.D.   On: 06/04/2019 22:57   DG Chest Portable 1 View  Result Date: 07-06-2019 CLINICAL DATA:  Status post intubation and OG tube placement today. EXAM: PORTABLE CHEST 1 VIEW COMPARISON:  Single-view of the chest 06/19/2019. FINDINGS: OG tube is in place with its tip in the distal esophagus. The tube should be advanced approximately 15 cm for positioning of the side-port in the stomach. New ET tube is also in place with its tip in good position 4.9 cm above the carina. Defibrillator pads are noted. There is coarsening of the pulmonary interstitium. No consolidative process, pneumothorax or effusion. Small focus of linear scar in the right middle lobe again seen. Heart size is normal. Atherosclerosis noted. IMPRESSION: ETT in good position. OG tube tip is in the distal esophagus. Please see report of dedicated plain film of the abdomen is same day. No acute disease. Electronically Signed   By: Inge Rise M.D.   On: 07/06/19 09:31   DG Chest Portable 1 View  Result Date: 06/04/2019 CLINICAL DATA:  Headache, respiratory failure, became unresponsive, family positive for COVID-19; past history asthma, CHF, COPD, diabetes mellitus, hypertension EXAM: PORTABLE CHEST 1 VIEW COMPARISON:  Portable exam 1903 hours compared to 04/20/2019 FINDINGS: Tip of endotracheal tube projects 5.6 cm above carina. External pacing leads project over chest. Normal heart size and mediastinal contours. Atherosclerotic calcification aorta. Chronic accentuation of bibasilar markings. No  definite acute infiltrate, pleural effusion or pneumothorax. IMPRESSION: Bibasilar scarring without acute abnormalities. Electronically Signed   By: Lavonia Dana M.D.   On: 06/04/2019 19:32   ECHOCARDIOGRAM COMPLETE  Result Date: 06/07/2019   ECHOCARDIOGRAM REPORT   Patient Name:   AIBHLINN CIARAVINO [facility] Date of Exam: 06/07/2019 Medical Rec #:  [id number]      Height:       65.0 in Accession #:    [id number]     Weight:       210.5 lb Date of Birth:  1961-02-14      BSA:          2.02 m Patient Age:    76 years       BP:           113/75 mmHg Patient Gender: F              HR:           76 bpm. Exam Location:  [facility] Procedure: 2D Echo, Cardiac Doppler and Color Doppler Indications:     Bacteremia 790.7  History:         Patient has prior history of Echocardiogram examinations, most                  recent 02/28/2019. CHF, COPD; Risk Factors:Hypertension and                  Diabetes.  Sonographer:     Sherrie Sport RDCS (AE) Referring Phys:  [place] Diagnosing Phys: Kate Sable MD IMPRESSIONS  1. Left ventricular ejection fraction, by visual estimation, is 60 to 65%. The left ventricle has normal function. There is no left ventricular hypertrophy.  2. The left ventricle has no regional wall motion abnormalities.  3. Global right ventricle has normal systolic function.The right ventricular size is normal. No increase in right ventricular wall thickness.  4. Left atrial size was mildly dilated.  5. Right atrial size was normal.  6. The mitral valve is normal in structure. No evidence of mitral valve regurgitation. No evidence of mitral stenosis.  7. The tricuspid valve is normal in structure.  8. The aortic valve is normal in structure. Aortic valve regurgitation is not visualized. No evidence of aortic valve sclerosis or stenosis.  9. The pulmonic valve was normal in structure. Pulmonic valve regurgitation is not visualized. 10. Normal pulmonary artery systolic pressure. 11. The inferior vena cava is  normal in size with greater than 50% respiratory variability, suggesting right atrial pressure of 3 mmHg. FINDINGS  Left Ventricle: Left ventricular ejection fraction, by visual estimation, is 60 to 65%. The left ventricle has normal function. The left ventricle has no regional wall motion abnormalities. There is no left ventricular hypertrophy. Left ventricular diastolic parameters were normal. Normal left atrial pressure. Right Ventricle: The right ventricular size is normal. No increase in right ventricular wall thickness. Global RV systolic function is has normal systolic function. The tricuspid regurgitant velocity is 1.97 m/s, and with an assumed right atrial pressure  of 3 mmHg, the estimated right ventricular systolic pressure is normal at 18.5 mmHg. Left Atrium: Left atrial size was mildly dilated. Right Atrium: Right atrial size was normal in size Pericardium: There is no evidence of pericardial effusion. Mitral Valve: The mitral valve is normal in structure. No evidence of mitral valve regurgitation. No evidence of mitral valve stenosis by observation. Tricuspid Valve: The tricuspid valve is normal in structure. Tricuspid valve regurgitation is not demonstrated. Aortic Valve: The aortic valve is normal in structure. Aortic valve regurgitation is not visualized. The aortic valve is structurally normal, with no evidence of sclerosis or stenosis. Aortic valve mean gradient measures 5.7 mmHg. Aortic valve peak gradient measures 9.1 mmHg. Aortic valve area, by VTI measures 2.13 cm. Pulmonic Valve: The pulmonic valve was normal in structure. Pulmonic valve regurgitation is not visualized. Pulmonic regurgitation is not visualized. Aorta: The aortic root, ascending aorta and aortic arch are all structurally normal, with no evidence of dilitation or obstruction and the aortic root is normal in size and structure. Venous: The inferior vena cava is normal in size with greater than 50% respiratory variability,  suggesting right atrial pressure of 3 mmHg. IAS/Shunts: No atrial level shunt detected by color flow Doppler. There is no evidence of a patent foramen ovale.  LEFT VENTRICLE PLAX 2D LVIDd:         4.56 cm  Diastology LVIDs:         2.75 cm  LV e' lateral:   13.40 cm/s LV PW:         0.81 cm  LV E/e' lateral: 7.2 LV IVS:        0.80 cm  LV e' medial:    9.03 cm/s LVOT diam:     2.00 cm  LV E/e' medial:  10.7 LV SV:         67 ml LV SV Index:   31.46 LVOT Area:     3.14 cm  RIGHT VENTRICLE RV Basal diam:  2.83 cm RV S prime:     16.20 cm/s TAPSE (M-mode): 4.2 cm LEFT ATRIUM             Index       RIGHT ATRIUM  Index LA diam:        4.00 cm 1.98 cm/m  RA Area:     16.90 cm LA Vol (A2C):   50.0 ml 24.73 ml/m RA Volume:   43.50 ml  21.52 ml/m LA Vol (A4C):   76.2 ml 37.69 ml/m LA Biplane Vol: 63.8 ml 31.56 ml/m  AORTIC VALVE                    PULMONIC VALVE AV Area (Vmax):    1.94 cm     PV Vmax:        0.79 m/s AV Area (Vmean):   1.98 cm     PV Peak grad:   2.5 mmHg AV Area (VTI):     2.13 cm     RVOT Peak grad: 5 mmHg AV Vmax:           151.00 cm/s AV Vmean:          105.433 cm/s AV VTI:            0.283 m AV Peak Grad:      9.1 mmHg AV Mean Grad:      5.7 mmHg LVOT Vmax:         93.10 cm/s LVOT Vmean:        66.600 cm/s LVOT VTI:          0.192 m LVOT/AV VTI ratio: 0.68  AORTA Ao Root diam: 2.70 cm MITRAL VALVE                        TRICUSPID VALVE MV Area (PHT): 3.60 cm             TR Peak grad:   15.5 mmHg MV PHT:        61.19 msec           TR Vmax:        197.00 cm/s MV Decel Time: 211 msec MV E velocity: 96.50 cm/s 103 cm/s  SHUNTS MV A velocity: 63.50 cm/s 70.3 cm/s Systemic VTI:  0.19 m MV E/A ratio:  1.52       1.5       Systemic Diam: 2.00 cm  Kate Sable MD Electronically signed by Kate Sable MD Signature Date/Time: 06/07/2019/3:15:49 PM    Final    Reflux (Bilateral)  Result Date: 05/30/2019  Lower Venous Reflux Study Indications: Pain, and Swelling.  Performing  Technologist: Almira Coaster RVS  Examination Guidelines: A complete evaluation includes B-mode imaging, spectral Doppler, color Doppler, and power Doppler as needed of all accessible portions of each vessel. Bilateral testing is considered an integral part of a complete examination. Limited examinations for reoccurring indications may be performed as noted. The reflux portion of the exam is performed with the patient in reverse Trendelenburg.  +---------+---------------+---------+-----------+----------+--------------+ RIGHT    CompressibilityPhasicitySpontaneityPropertiesThrombus Aging +---------+---------------+---------+-----------+----------+--------------+ CFV      Full           Yes      Yes                                 +---------+---------------+---------+-----------+----------+--------------+ SFJ      Full           Yes      Yes                                 +---------+---------------+---------+-----------+----------+--------------+  FV Prox  Full           Yes      Yes                                 +---------+---------------+---------+-----------+----------+--------------+ FV Mid   Full           Yes      Yes                                 +---------+---------------+---------+-----------+----------+--------------+ FV DistalFull           Yes      Yes                                 +---------+---------------+---------+-----------+----------+--------------+ PFV      Full           Yes      Yes                                 +---------+---------------+---------+-----------+----------+--------------+ POP      Full           Yes      Yes                                 +---------+---------------+---------+-----------+----------+--------------+ GSV      Full           Yes      Yes                                 +---------+---------------+---------+-----------+----------+--------------+ SSV      Full           Yes      Yes                                  +---------+---------------+---------+-----------+----------+--------------+   +---------+---------------+---------+-----------+----------+--------------+ LEFT     CompressibilityPhasicitySpontaneityPropertiesThrombus Aging +---------+---------------+---------+-----------+----------+--------------+ CFV      Full           Yes      Yes                                 +---------+---------------+---------+-----------+----------+--------------+ SFJ      Full           Yes      Yes                                 +---------+---------------+---------+-----------+----------+--------------+ FV Prox  Full           Yes      Yes                                 +---------+---------------+---------+-----------+----------+--------------+ FV Mid   Full           Yes      Yes                                 +---------+---------------+---------+-----------+----------+--------------+  FV DistalFull           Yes      Yes                                 +---------+---------------+---------+-----------+----------+--------------+ PFV      Full           Yes      Yes                                 +---------+---------------+---------+-----------+----------+--------------+ POP      Full           Yes      Yes                                 +---------+---------------+---------+-----------+----------+--------------+ GSV      Full           Yes      Yes                                 +---------+---------------+---------+-----------+----------+--------------+ SSV      Full           Yes      Yes                                 +---------+---------------+---------+-----------+----------+--------------+   Venous Reflux Times +--------------+------+-----------+------------+--------+ RIGHT         RefluxReflux TimeDiameter cmsComments                Yes                                  +--------------+------+-----------+------------+--------+  GSV at SFJ                         1.07             +--------------+------+-----------+------------+--------+ GSV prox thigh                     .92              +--------------+------+-----------+------------+--------+ GSV mid thigh                      .61              +--------------+------+-----------+------------+--------+ GSV dist thigh                     .51              +--------------+------+-----------+------------+--------+ GSV at knee                        .58              +--------------+------+-----------+------------+--------+ GSV prox calf  yes    1049 ms      .48              +--------------+------+-----------+------------+--------+ SSV Pop Fossa                      .  31              +--------------+------+-----------+------------+--------+  +--------------+------+-----------+------------+--------+ LEFT          RefluxReflux TimeDiameter cmsComments                Yes                                  +--------------+------+-----------+------------+--------+ GSV at SFJ                         .88              +--------------+------+-----------+------------+--------+ GSV prox thigh                     .91              +--------------+------+-----------+------------+--------+ GSV mid thigh                      .49              +--------------+------+-----------+------------+--------+ GSV dist thigh                     .45              +--------------+------+-----------+------------+--------+ GSV at knee                        .37              +--------------+------+-----------+------------+--------+ GSV prox calf                      .37              +--------------+------+-----------+------------+--------+ SSV Pop Fossa                      .35              +--------------+------+-----------+------------+--------+   Summary: Right: Evidence of chronic venous insufficiency is detected in the great  saphenous vein. There is no evidence of deep vein thrombosis in the lower extremity.There is no evidence of superficial venous thrombosis. Left: There is no evidence of deep vein thrombosis in the lower extremity. There is no evidence of superficial venous thrombosis.There is no evidence of chronic venous insufficiency.  *See table(s) above for measurements and observations. Electronically signed by Leotis Pain MD on 05/30/2019 at 8:44:17 AM.    Final     Microbiology Recent Results (from the past 240 hour(s))  Respiratory Panel by RT PCR (Flu A&B, Covid) - Nasopharyngeal Swab     Status: None   Collection Time: 22-Jun-2019  9:19 AM   Specimen: Nasopharyngeal Swab  Result Value Ref Range Status   SARS Coronavirus 2 by RT PCR NEGATIVE NEGATIVE Final    Comment: (NOTE) SARS-CoV-2 target nucleic acids are NOT DETECTED. The SARS-CoV-2 RNA is generally detectable in upper respiratoy specimens during the acute phase of infection. The lowest concentration of SARS-CoV-2 viral copies this assay can detect is 131 copies/mL. A negative result does not preclude SARS-Cov-2 infection and should not be used as the sole basis for treatment or other patient management decisions. A negative result may occur with  improper specimen collection/handling, submission of specimen other than nasopharyngeal swab, presence of viral mutation(s) within the areas targeted by this assay, and inadequate number  of viral copies (<131 copies/mL). A negative result must be combined with clinical observations, patient history, and epidemiological information. The expected result is Negative. Fact Sheet for Patients:  PinkCheek.be Fact Sheet for Healthcare Providers:  GravelBags.it This test is not yet ap proved or cleared by the [place] FDA and  has been authorized for detection and/or diagnosis of SARS-CoV-2 by FDA under an Emergency Use Authorization (EUA). This  EUA will remain  in effect (meaning this test can be used) for the duration of the COVID-19 declaration under Section 564(b)(1) of the Act, 21 U.S.C. section 360bbb-3(b)(1), unless the authorization is terminated or revoked sooner.    Influenza A by PCR NEGATIVE NEGATIVE Final   Influenza B by PCR NEGATIVE NEGATIVE Final    Comment: (NOTE) The Xpert Xpress SARS-CoV-2/FLU/RSV assay is intended as an aid in  the diagnosis of influenza from Nasopharyngeal swab specimens and  should not be used as a sole basis for treatment. Nasal washings and  aspirates are unacceptable for Xpert Xpress SARS-CoV-2/FLU/RSV  testing. Fact Sheet for Patients: PinkCheek.be Fact Sheet for Healthcare Providers: GravelBags.it This test is not yet approved or cleared by the [place] FDA and  has been authorized for detection and/or diagnosis of SARS-CoV-2 by  FDA under an Emergency Use Authorization (EUA). This EUA will remain  in effect (meaning this test can be used) for the duration of the  Covid-19 declaration under Section 564(b)(1) of the Act, 21  U.S.C. section 360bbb-3(b)(1), unless the authorization is  terminated or revoked. Performed at [facility], [address] [place], [place] [address]   MRSA PCR Screening     Status: None   Collection Time: 07/03/19 10:50 AM   Specimen: Nasopharyngeal  Result Value Ref Range Status   MRSA by PCR NEGATIVE NEGATIVE Final    Comment:        The GeneXpert MRSA Assay (FDA approved for NASAL specimens only), is one component of a comprehensive MRSA colonization surveillance program. It is not intended to diagnose MRSA infection nor to guide or monitor treatment for MRSA infections. Performed at [facility], [address] [place], [place] [id number]     Lab Basic Metabolic Panel: Recent Labs  Lab 2019-07-03 1006 July 03, 2019 1202  NA 144  --   K 5.5*  --   CL 98  --    CO2 33*  --   GLUCOSE 249*  --   BUN 22*  --   CREATININE 1.18*  --   CALCIUM 10.7*  --   MG  --  2.2  PHOS  --  6.8*   Liver Function Tests: Recent Labs  Lab July 03, 2019 1006  AST 150*  ALT 123*  ALKPHOS 57  BILITOT 0.8  PROT 6.6  ALBUMIN 3.5   Recent Labs  Lab 2019-07-03 1006  LIPASE 27   No results for input(s): AMMONIA in the last 168 hours. CBC: Recent Labs  Lab 2019-07-03 0857  WBC 20.4*  NEUTROABS 16.0*  HGB 16.2*  HCT 55.1*  MCV 101.3*  PLT 223   Cardiac Enzymes: No results for input(s): CKTOTAL, CKMB, CKMBINDEX, TROPONINI in the last 168 hours. Sepsis Labs: Recent Labs  Lab 07/03/19 0857  WBC 20.4*    Procedures/Operations  Intubated 1/12>>        Darel Hong, [facility] [place] [facility] Pager: [phone]  Bradly Bienenstock July 03, 2019, 10:13 PM

## 2019-07-10 NOTE — H&P (Addendum)
[facility] Consultation      Name: Shelly [name] MRN: [id number] DOB: Dec 24, 1960    ADMISSION DATE:  July 07, 2019 CONSULTATION DATE:  07-Jul-2019  REFERRING MD :  Dr. Carrie Mew   CHIEF COMPLAINT:     Severe Acute Hypoxic Respiratory failure, s/p Cardiac arrest   HISTORY OF PRESENT ILLNESS    Shelly [name] is a 59 yo female with a history of COPD, diastolic heart failure, DMT2, GAD and cervical spondylosis.   She had a recent hospitalization from 06/04/2019 to 06/08/2019 for acute respiratory failure due to COPD exacerbation.  She was seen as an outpatient by Dr. Humphrey Rolls yesterday for a COPD exacerbation, pneumonia and CHF.  Today, she developed worsening SOB, and presented to ED via EMS.  Upon arrival, patient was exhibiting agonal respirations and went into cardiac arrest upon entering treatment room.  CPR was initiated.  Patient received 5 minutes of CPR and 2 rounds of epinephrine.  ED workup included EKG with ST elevation in leads II, III, and aVF concerning for possible inferior STEMI.  Troponin 27.  Leukocytosis at 20.4.  PH 7.21.  PCO2 87.  Bicarbonate 34.8.  CXR demonstrated linear scar in right middle lobe, but no evidence of lung infiltrates.    Patient with multiorgan failure Respiratory failure cardiac failure Severe cardiogenic shock Patient with signs and symptoms of brain damage with seizure-like activity upon arrival to the ICU Patient already hypothermic with temperature of 36 degrees Patient in severe shock  I have contacted the husband who is not at bedside I have explained to him the grave prognosis  Clinical status relayed to family-husband  Updated and notified of patients medical condition-  Progressive multiorgan failure with very low chance of meaningful recovery.  Patient is in dying  Process.  Family understands the situation.  They have consented and agreed to DNR  Family are satisfied with Plan of action and management. All  questions answered   As I was talking to him the phone cut off Patients husband had relayed to me that he he would like to proceed with comfort care measures however I need to verify this decision   SIGNIFICANT EVENTS   1/12: Presented to ED via EMS in acute respiratory distress.  ST elevation MI.  Cardiac arrest, regained pulse following CPR.  PAST MEDICAL HISTORY   Past Medical History:  Diagnosis Date  . Asthma   . CHF (congestive heart failure) ([place])   . COPD (chronic obstructive pulmonary disease) ([place])   . Diabetes mellitus without complication ([place])   . Hypertension   . Sleep apnea    Past Surgical History:  Procedure Laterality Date  . ABDOMINAL HYSTERECTOMY    . COLON SURGERY     Prior to Admission medications   Medication Sig Start Date End Date Taking? Authorizing Provider  clonazePAM (KLONOPIN) 0.5 MG tablet Take 1 tablet (0.5 mg total) by mouth 2 (two) times daily. 06/19/19  Yes [name], MD  furosemide (LASIX) 40 MG tablet Take 40 mg by mouth 2 (two) times daily. 04/05/19  Yes [provider]  lisinopril (ZESTRIL) 20 MG tablet Take 20 mg by mouth daily. 01/04/19  Yes [provider]  albuterol (VENTOLIN HFA) 108 (90 Base) MCG/ACT inhaler Inhale 2 puffs into the lungs every 6 (six) hours as needed for wheezing or shortness of breath. 03/06/19   Fritzi Mandes, MD  baclofen (LIORESAL) 10 MG tablet Take 10 mg by mouth 3 (three) times daily. 01/13/19  [provider]  busPIRone (BUSPAR) 10 MG tablet Take 10 mg by mouth 2 (two) times daily. 04/14/19   [provider]  DULoxetine (CYMBALTA) 30 MG capsule Take 1 capsule (30 mg total) by mouth 2 (two) times daily. 03/03/19   Max Sane, MD  fluticasone furoate-vilanterol (BREO ELLIPTA) 200-25 MCG/INH AEPB Inhale 1 puff into the lungs daily. 09/09/16   Fritzi Mandes, MD  ipratropium-albuterol (DUONEB) 0.5-2.5 (3) MG/3ML SOLN Inhale 3 mLs into the lungs 4 (four) times daily. 03/20/19 03/14/20   [provider]   No Known Allergies   FAMILY HISTORY   Family History  Adopted: Yes  Family history unknown: Yes      SOCIAL HISTORY    reports that she has quit smoking. She has never used smokeless tobacco. She reports that she does not drink alcohol or use drugs.   ROS   Unable to obtain ROS due to severity of patient illness    VITAL SIGNS    Temp:  [96.9 F (36.1 C)-97.2 F (36.2 C)] 97 F (36.1 C) (01/12 1125) Pulse Rate:  [64-139] 64 (01/12 1125) Resp:  [7-20] 20 (01/12 1125) BP: (78-145)/(51-110) 89/59 (01/12 1125) SpO2:  [89 %-100 %] 93 % (01/12 1125) FiO2 (%):  [100 %] 100 % (01/12 0845) Weight:  [96.2 kg-113 kg] 113 kg (01/12 0854) HEMODYNAMICS:   VENTILATOR SETTINGS: Vent Mode: AC FiO2 (%):  [100 %] 100 % Set Rate:  [20 bmp] 20 bmp Vt Set:  [500 mL] 500 mL PEEP:  [5 cmH20] 5 cmH20 INTAKE / OUTPUT:  Intake/Output Summary (Last 24 hours) at Jul 12, 2019 1141 Last data filed at July 12, 2019 1000 Gross per 24 hour  Intake 1000 ml  Output --  Net 1000 ml    REVIEW OF SYSTEMS  PATIENT IS UNABLE TO PROVIDE COMPLETE REVIEW OF SYSTEM S DUE TO SEVERE CRITICAL ILLNESS AND ENCEPHALOPATHY   PHYSICAL EXAMINATION:  GENERAL:critically ill appearing, +resp distress HEAD: Normocephalic, atraumatic.  EYES: Pupils equal, round, reactive to light.  No scleral icterus.  MOUTH: Moist mucosal membrane. NECK: Supple. No thyromegaly. No nodules. No JVD.  PULMONARY: +rhonchi, +wheezing CARDIOVASCULAR: S1 and S2. Regular rate and rhythm. No murmurs, rubs, or gallops.  GASTROINTESTINAL: Soft, nontender, -distended. Positive bowel sounds.  MUSCULOSKELETAL: No swelling, clubbing, or edema.  NEUROLOGIC: obtunded, facial twitching and muscle twitching  SKIN:intact,warm,dry         LABS   LABS:  CBC Recent Labs  Lab 07/12/2019 0857  WBC 20.4*  HGB 16.2*  HCT 55.1*  PLT 223   Coag's Recent Labs  Lab 07/12/19 1006  INR 0.9   BMET Recent  Labs  Lab 2019/07/12 1006  NA 144  K 5.5*  CL 98  CO2 33*  BUN 22*  CREATININE 1.18*  GLUCOSE 249*   Electrolytes Recent Labs  Lab 2019-07-12 1006  CALCIUM 10.7*   Sepsis Markers No results for input(s): LATICACIDVEN, PROCALCITON, O2SATVEN in the last 168 hours. ABG Recent Labs  Lab 07/12/2019 0919  PHART 7.21*  PCO2ART 87*  PO2ART 248*   Liver Enzymes Recent Labs  Lab 07/12/2019 1006  AST 150*  ALT 123*  ALKPHOS 57  BILITOT 0.8  ALBUMIN 3.5   Cardiac Enzymes No results for input(s): TROPONINI, PROBNP in the last 168 hours. Glucose No results for input(s): GLUCAP in the last 168 hours.   No results found for this or any previous visit (from the past 240 hour(s)).   Current Facility-Administered Medications:  .  0.9 %  sodium  chloride infusion, 250 mL, Intravenous, Continuous, Carrie Mew, MD .  0.9 %  sodium chloride infusion, 250 mL, Intravenous, PRN, Flora Lipps, MD .  acetaminophen (TYLENOL) tablet 650 mg, 650 mg, Oral, Q4H PRN, Flora Lipps, MD .  enoxaparin (LOVENOX) injection 40 mg, 40 mg, Subcutaneous, Q24H, Ilyas Lipsitz, MD .  famotidine (PEPCID) IVPB 20 mg premix, 20 mg, Intravenous, Q12H, Ayeden Gladman, MD .  fentaNYL (SUBLIMAZE) bolus via infusion 50 mcg, 50 mcg, Intravenous, Q15 min PRN, Flora Lipps, MD .  fentaNYL (SUBLIMAZE) injection 50 mcg, 50 mcg, Intravenous, Once, Flora Lipps, MD .  fentaNYL 2536mcg in NS 237mL (64mcg/ml) infusion-PREMIX, 50-200 mcg/hr, Intravenous, Continuous, Yzabelle Calles, MD .  midazolam (VERSED) injection 2 mg, 2 mg, Intravenous, Q15 min PRN, Flora Lipps, MD .  midazolam (VERSED) injection 2 mg, 2 mg, Intravenous, Q2H PRN, Mortimer Fries, Lannis Lichtenwalner, MD .  norepinephrine (LEVOPHED) 4mg  in 284mL premix infusion, 2-10 mcg/min, Intravenous, Titrated, Carrie Mew, MD, Last Rate: 30 mL/hr at July 16, 2019 1106, 8 mcg/min at 07-16-19 1106 .  ondansetron (ZOFRAN) injection 4 mg, 4 mg, Intravenous, Q6H PRN, Mortimer Fries, Santita Hunsberger, MD .  propofol  (DIPRIVAN) 1000 MG/100ML infusion, 30-80 mcg/kg/min, Intravenous, Titrated, Carrie Mew, MD, Last Rate: 27.1 mL/hr at 07-16-2019 1049, 40 mcg/kg/min at 07-16-2019 1049 .  propofol (DIPRIVAN) bolus via infusion 56.5 mg, 0.5 mg/kg, Intravenous, Once, Carrie Mew, MD .  senna-docusate (Senokot-S) tablet 2 tablet, 2 tablet, Per Tube, BID, Mortimer Fries, Tahnee Cifuentes, MD .  sodium chloride flush (NS) 0.9 % injection 3 mL, 3 mL, Intravenous, Q12H, Maryella Abood, MD .  sodium chloride flush (NS) 0.9 % injection 3 mL, 3 mL, Intravenous, PRN, Mortimer Fries, Kory Panjwani, MD .  vancomycin (VANCOCIN) IVPB 1000 mg/200 mL premix, 1,000 mg, Intravenous, Once, Carrie Mew, MD, Last Rate: 200 mL/hr at Jul 16, 2019 1052, 1,000 mg at 2019/07/16 1052  Current Outpatient Medications:  .  clonazePAM (KLONOPIN) 0.5 MG tablet, Take 1 tablet (0.5 mg total) by mouth 2 (two) times daily., Disp: 60 tablet, Rfl: 2 .  furosemide (LASIX) 40 MG tablet, Take 40 mg by mouth 2 (two) times daily., Disp: , Rfl:  .  lisinopril (ZESTRIL) 20 MG tablet, Take 20 mg by mouth daily., Disp: , Rfl:  .  albuterol (VENTOLIN HFA) 108 (90 Base) MCG/ACT inhaler, Inhale 2 puffs into the lungs every 6 (six) hours as needed for wheezing or shortness of breath., Disp: 1 g, Rfl: 0 .  baclofen (LIORESAL) 10 MG tablet, Take 10 mg by mouth 3 (three) times daily., Disp: , Rfl:  .  busPIRone (BUSPAR) 10 MG tablet, Take 10 mg by mouth 2 (two) times daily., Disp: , Rfl:  .  DULoxetine (CYMBALTA) 30 MG capsule, Take 1 capsule (30 mg total) by mouth 2 (two) times daily., Disp: 60 capsule, Rfl: 0 .  fluticasone furoate-vilanterol (BREO ELLIPTA) 200-25 MCG/INH AEPB, Inhale 1 puff into the lungs daily., Disp: 1 each, Rfl: 0 .  ipratropium-albuterol (DUONEB) 0.5-2.5 (3) MG/3ML SOLN, Inhale 3 mLs into the lungs 4 (four) times daily., Disp: , Rfl:   IMAGING    DG Chest 2 View  Result Date: 06/19/2019 CLINICAL DATA:  Follow-up pneumonia. Shortness of breath. Productive cough. Asthma.  EXAM: CHEST - 2 VIEW COMPARISON:  06/04/2019 FINDINGS: The heart size and mediastinal contours are within normal limits. Endotracheal tube is been removed since previous study. Aortic atherosclerosis incidentally noted. Scarring in right middle lobe remains stable. Both lungs are otherwise clear. Previously noted opacity in left lung base is no longer visualized. The visualized skeletal  structures are unremarkable. IMPRESSION: Right middle lobe scarring.  No active cardiopulmonary disease. Electronically Signed   By: Marlaine Hind M.D.   On: 06/19/2019 15:19   DG Abdomen 1 View  Result Date: 2019/07/10 CLINICAL DATA:  OG tube placement. EXAM: ABDOMEN - 1 VIEW COMPARISON:  Single-view of the chest earlier today. FINDINGS: OG tube has been advanced since the prior exam. The tip of the tube is now in the stomach with the side port at the gastroesophageal junction. Recommend advancement of 4 cm. IMPRESSION: OG tube side port is at the gastroesophageal junction. Recommend advancement of 4 cm. Electronically Signed   By: Inge Rise M.D.   On: 07/10/2019 09:33   DG Chest Portable 1 View  Result Date: 07/10/2019 CLINICAL DATA:  Status post intubation and OG tube placement today. EXAM: PORTABLE CHEST 1 VIEW COMPARISON:  Single-view of the chest 06/19/2019. FINDINGS: OG tube is in place with its tip in the distal esophagus. The tube should be advanced approximately 15 cm for positioning of the side-port in the stomach. New ET tube is also in place with its tip in good position 4.9 cm above the carina. Defibrillator pads are noted. There is coarsening of the pulmonary interstitium. No consolidative process, pneumothorax or effusion. Small focus of linear scar in the right middle lobe again seen. Heart size is normal. Atherosclerosis noted. IMPRESSION: ETT in good position. OG tube tip is in the distal esophagus. Please see report of dedicated plain film of the abdomen is same day. No acute disease. Electronically  Signed   By: Inge Rise M.D.   On: 07/10/19 09:31      Indwelling Urinary Catheter continued, requirement due to   Reason to continue Indwelling Urinary Catheter for strict Intake/Output monitoring for hemodynamic instability         Ventilator continued, requirement due to, resp failure    Ventilator Sedation RASS 0 to -2     INDWELLING DEVICES::  MICRO DATA: MRSA PCR  Urine  Awaiting Urine culture results Blood Awaiting blood culture results Resp  Awaiting flu A&B, COVID swab  ANTIMICROBIALS:  Vancomycin   ASSESSMENT/PLAN   Patient with multiorgan failure in the setting of severe anoxic brain injury and brain damage from cardiac arrest most likely from non-STEMI with COPD exacerbation in the setting of severe lactic acidosis with persistent hypothermia along with severe cardiogenic shock, patient is NOT a candidate for hypothermia protocol.  Patient is in the dying process   Cardiogenic Shock s/p cardiac arrest and inferior NSTEMI - Current BP at 89/59 mmHg, cardiac arrest at 8:43 upon arrival to ED.  Pulse resumed after CPR.  ST elevation in II, III and aVF. - Patient at high risk for repeat cardiac arrest- therapeutic hypothermia not advised at this time. - On levophed, intubated and sedated - Telemetry monitoring in ICU  Acute on chronic respiratory failure with history of COPD, s/p cardiac arrest - recent hospitalization for COPD exacerbation and pneumonia.  Worsening SOB this morning.  Arrived at ED with agonal respirations.  Intubated and sedated.  Now maintaining SpO2 at 100% with FiO2 100% and PEEP 5.  COVID negative.  - CXR today shows no evidence of infiltrates.  Respiratory Acidosis with metabolic compensation - pH 7.21, pCO2 87, bicarbonate 34.8. - Patient on mechanical ventilation  - Waiting on repeat ABG  Goals of Care - Plan family meeting to discuss goals of care today, code status. - Risk of subsequent cardiac arrest high.  Acute  kidney injury -  Potassium 5.5. Creatinine 1.18 today.  BUN 22.   - Consider nephrology consult depending on possible cardiac status.   - IV fluids with 0.9% NaCl  Neurology - Possible seizure activity following cardiac arrest.   - sedated with propofol. - Goal RASS 0 to -1.  Diabetes Mellitus Type 2: - Glucose 249. - Sliding scale insulin aspart.   Overall, patient is critically ill with multiorgan failure following cardiac arrest. Prognosis is guarded. Patient at high risk for subsequent cardiac arrest and death.   Patient is now a DNR status Husband wishes to proceed with comfort care measures however will need to clarify and verify the plan of care regarding this   Corrin Parker, M.D.  [facility]  Medical Director [place] Director [facility] Cardio-Pulmonary Department

## 2019-07-10 NOTE — ED Notes (Signed)
Attempted to call report; charge RN unavailable to take at this time.  Name and ascom number left to receive call back.

## 2019-07-10 NOTE — Progress Notes (Signed)
Family At bedside, clinical status relayed to family  Updated and notified of patients medical condition-  Progressive multiorgan failure with very low chance of meaningful recovery.  Patient is in dying  Process.  Family understands the situation.  They have consented and agreed to DNR/DNI and would like to proceed with Comfort care measures.   Family are satisfied with Plan of action and management. All questions answered  Dauna Ziska David Camauri Fleece, M.D.  [place] [facility]  Medical Director ICU-[facility] Hand Medical Director [facility] Cardio-Pulmonary Department     

## 2019-07-10 NOTE — Progress Notes (Signed)
Transported patient from ER to CCU with no issues, placed on Servo vent when arrived in unit.

## 2019-07-10 NOTE — ED Triage Notes (Signed)
Pt in via [facility] from home.  Call out for shortness of breath.  Pt on NRB mask upon arrival, unresponsive, cyanotic and found to be pulseless.  EDP, [place] at bedside; CPR initiated upon arrival.  See Code Narrator.

## 2019-07-10 NOTE — Code Documentation (Signed)
Post arrest, pt HR 186, EKG reading STEMI.  One shock at 200J initiated per EDP, [place] at this time.

## 2019-07-10 NOTE — Progress Notes (Signed)
Clinical status relayed to family-Husband  Updated and notified of patients medical condition-  Progressive multiorgan failure with very low chance of meaningful recovery.  Patient is in dying  Process.  Family understands the situation.  They have consented and agreed to DNR.   Family are satisfied with Plan of action and management. All questions answered  Corrin Parker, M.D.  [facility]  Medical Director [place] Director [facility] Cardio-Pulmonary Department

## 2019-07-10 NOTE — Code Documentation (Addendum)
Pulse Check performed; no pulse at this time.  CPR resumed.

## 2019-07-10 NOTE — ED Notes (Signed)
ED TO INPATIENT HANDOFF REPORT  ED Nurse Name and Phone #: Caryl Pina, [place]  S Name/Age/Gender Shelly [name] 59 y.o. female Room/Bed: ED06A/ED06A  Code Status   Code Status: Full Code  Home/SNF/Other Home Patient oriented to: UTA Is this baseline? No      Chief Complaint Respiratory failure ([place]) [J96.90]  Triage Note Pt in via [facility] from home.  Call out for shortness of breath.  Pt on NRB mask upon arrival, unresponsive, cyanotic and found to be pulseless.  EDP, [place] at bedside; CPR initiated upon arrival.  See Code Narrator.      Allergies No Known Allergies  Level of Care/Admitting Diagnosis ED Disposition    ED Disposition Condition [facility] Area: [address] [100120]  Level of Care: ICU [6]  Covid Evaluation: Symptomatic Person Under Investigation (PUI)  Diagnosis: Respiratory failure [facility]) [id number]  Admitting Physician: Flora Lipps [phone]  Attending Physician: Flora Lipps [phone]  Estimated length of stay: 3 - 4 days  Certification:: I certify this patient will need inpatient services for at least 2 midnights       B Medical/Surgery History Past Medical History:  Diagnosis Date  . Asthma   . CHF (congestive heart failure) ([place])   . COPD (chronic obstructive pulmonary disease) ([place])   . Diabetes mellitus without complication ([place])   . Hypertension   . Sleep apnea    Past Surgical History:  Procedure Laterality Date  . ABDOMINAL HYSTERECTOMY    . COLON SURGERY       A IV Location/Drains/Wounds Patient Lines/Drains/Airways Status   Active Line/Drains/Airways    Name:   Placement date:   Placement time:   Site:   Days:   Peripheral IV 08-Jul-2019 Left Antecubital   Jul 08, 2019    0830    Antecubital   less than 1   Peripheral IV Jul 08, 2019 Left Hand   07-08-2019    0800    Hand   less than 1   Peripheral IV 07/08/19 Right Forearm   2019-07-08    0856    Forearm   less than 1   NG/OG Tube Orogastric 16 Fr.  Center mouth Xray Documented cm marking at nare/ corner of mouth 55 cm   07/08/19    [place] mouth   less than 1   Urethral Catheter Brandi, RN Temperature probe 16 Fr.   07-08-2019    0900    Temperature probe   less than 1   Airway 7.5 mm   07-08-2019    0845     less than 1          Intake/Output Last 24 hours No intake or output data in the 24 hours ending Jul 08, 2019 1030  Labs/Imaging Results for orders placed or performed during the hospital encounter of July 08, 2019 (from the past 48 hour(s))  CBC with Differential     Status: Abnormal   Collection Time: 2019-07-08  8:57 AM  Result Value Ref Range   WBC 20.4 (H) 4.0 - 10.5 K/uL   RBC 5.44 (H) 3.87 - 5.11 MIL/uL   Hemoglobin 16.2 (H) 12.0 - 15.0 g/dL   HCT 55.1 (H) 36.0 - 46.0 %   MCV 101.3 (H) 80.0 - 100.0 fL   MCH 29.8 26.0 - 34.0 pg   MCHC 29.4 (L) 30.0 - 36.0 g/dL   RDW 15.1 11.5 - 15.5 %   Platelets 223 150 - 400 K/uL   nRBC 0.3 (H) 0.0 -  0.2 %   Neutrophils Relative % 78 %   Neutro Abs 16.0 (H) 1.7 - 7.7 K/uL   Lymphocytes Relative 12 %   Lymphs Abs 2.4 0.7 - 4.0 K/uL   Monocytes Relative 4 %   Monocytes Absolute 0.8 0.1 - 1.0 K/uL   Eosinophils Relative 0 %   Eosinophils Absolute 0.0 0.0 - 0.5 K/uL   Basophils Relative 1 %   Basophils Absolute 0.2 (H) 0.0 - 0.1 K/uL   Immature Granulocytes 5 %   Abs Immature Granulocytes 1.07 (H) 0.00 - 0.07 K/uL    Comment: Performed at [facility], [address] [place], [place] [address]  Blood gas, arterial     Status: Abnormal   Collection Time: 07-08-19  9:19 AM  Result Value Ref Range   FIO2 1.00    Delivery systems VENTILATOR    Mode ASSIST CONTROL    VT 500 mL   LHR 20 resp/min   Peep/cpap 5.0 cm H20   pH, Arterial 7.21 (L) 7.350 - 7.450   pCO2 arterial 87 (HH) 32.0 - 48.0 mmHg    Comment: CRITICAL RESULT CALLED TO, READ BACK BY AND VERIFIED WITH:  STAFFORD AT 0931, 2019/07/08, LS    pO2, Arterial 248 (H) 83.0 - 108.0 mmHg   Bicarbonate 34.8 (H) 20.0  - 28.0 mmol/L   Acid-Base Excess 3.9 (H) 0.0 - 2.0 mmol/L   O2 Saturation 99.8 %   Patient temperature 37.0    Collection site RIGHT BRACHIAL    Sample type ARTERIAL DRAW    Allens test (pass/fail) PASS PASS    Comment: Performed at [facility], [address], [place],  [address]   DG Chest 2 View  Result Date: 06/19/2019 CLINICAL DATA:  Follow-up pneumonia. Shortness of breath. Productive cough. Asthma. EXAM: CHEST - 2 VIEW COMPARISON:  06/04/2019 FINDINGS: The heart size and mediastinal contours are within normal limits. Endotracheal tube is been removed since previous study. Aortic atherosclerosis incidentally noted. Scarring in right middle lobe remains stable. Both lungs are otherwise clear. Previously noted opacity in left lung base is no longer visualized. The visualized skeletal structures are unremarkable. IMPRESSION: Right middle lobe scarring.  No active cardiopulmonary disease. Electronically Signed   By: Marlaine Hind M.D.   On: 06/19/2019 15:19   DG Abdomen 1 View  Result Date: Jul 08, 2019 CLINICAL DATA:  OG tube placement. EXAM: ABDOMEN - 1 VIEW COMPARISON:  Single-view of the chest earlier today. FINDINGS: OG tube has been advanced since the prior exam. The tip of the tube is now in the stomach with the side port at the gastroesophageal junction. Recommend advancement of 4 cm. IMPRESSION: OG tube side port is at the gastroesophageal junction. Recommend advancement of 4 cm. Electronically Signed   By: Inge Rise M.D.   On: 2019-07-08 09:33   DG Chest Portable 1 View  Result Date: 2019/07/08 CLINICAL DATA:  Status post intubation and OG tube placement today. EXAM: PORTABLE CHEST 1 VIEW COMPARISON:  Single-view of the chest 06/19/2019. FINDINGS: OG tube is in place with its tip in the distal esophagus. The tube should be advanced approximately 15 cm for positioning of the side-port in the stomach. New ET tube is also in place with its tip in good position 4.9 cm  above the carina. Defibrillator pads are noted. There is coarsening of the pulmonary interstitium. No consolidative process, pneumothorax or effusion. Small focus of linear scar in the right middle lobe again seen. Heart size is normal. Atherosclerosis  noted. IMPRESSION: ETT in good position. OG tube tip is in the distal esophagus. Please see report of dedicated plain film of the abdomen is same day. No acute disease. Electronically Signed   By: Inge Rise M.D.   On: 2019/06/24 09:31    Pending Labs Unresulted Labs (From admission, onward)    Start     Ordered   06/27/19 0500  Creatinine, serum  (enoxaparin (LOVENOX)    CrCl >/= 30 ml/min)  Weekly,   STAT    Comments: while on enoxaparin therapy    06-24-2019 1025   06/21/19 0500  CBC  Tomorrow morning,   STAT     06/24/19 1025   06/21/19 [id number]  Basic metabolic panel  Tomorrow morning,   STAT     06-24-2019 1025   06/21/19 0500  Blood gas, arterial  Tomorrow morning,   STAT     06/24/19 1025   2019/06/24 0953  Culture, blood (routine x 2)  BLOOD CULTURE X 2,   STAT     06-24-19 0952   Jun 24, 2019 0953  Urinalysis, Complete w Microscopic  ONCE - STAT,   STAT     06/24/19 0952   2019-06-24 0953  Urine culture  Once,   STAT     06/24/19 0952   06-24-19 0935  Comprehensive metabolic panel  Once,   STAT     June 24, 2019 0935   06/24/2019 0935  Lipase, blood  Once,   STAT     06-24-19 0935   June 24, 2019 0935  Protime-INR  Once,   STAT     2019/06/24 0935   2019/06/24 0857  Respiratory Panel by RT PCR (Flu A&B, Covid) - Nasopharyngeal Swab  (Tier 2 Respiratory Panel by RT PCR (Flu A&B, Covid) (TAT 2 hrs))  ONCE - STAT,   STAT    Question Answer Comment  Is this test for diagnosis or screening Diagnosis of ill patient   Symptomatic for COVID-19 as defined by CDC Yes   Date of Symptom Onset 06/19/2019   Hospitalized for COVID-19 Yes   Admitted to ICU for COVID-19 No   Previously tested for COVID-19 Yes   Resident in a congregate (group) care setting No    Employed in healthcare setting No   Pregnant No      24-Jun-2019 0856          Vitals/Pain Today's Vitals   2019-06-24 0940 06/24/19 0950 06-24-2019 1010 06/24/2019 1020  BP: (!) 83/63 (!) 78/54 110/90 91/67  Pulse: 77 73 74 67  Resp: 20 20 15 20   SpO2: 100% 100% 100% 100%  Weight:      Height:        Isolation Precautions Airborne and Contact precautions  Medications Medications  propofol (DIPRIVAN) 1000 MG/100ML infusion (30 mcg/kg/min  113 kg Intravenous Restarted 2019/06/24 1005)  propofol (DIPRIVAN) bolus via infusion 56.5 mg (56.5 mg Intravenous Not Given June 24, 2019 0904)  0.9 %  sodium chloride infusion (has no administration in time range)  norepinephrine (LEVOPHED) 4mg  in 259mL premix infusion (3 mcg/min Intravenous Rate/Dose Change Jun 24, 2019 1008)  vancomycin (VANCOCIN) IVPB 1000 mg/200 mL premix (has no administration in time range)  ceFEPIme (MAXIPIME) 2 g in sodium chloride 0.9 % 100 mL IVPB (has no administration in time range)  sodium chloride flush (NS) 0.9 % injection 3 mL (has no administration in time range)  sodium chloride flush (NS) 0.9 % injection 3 mL (has no administration in time range)  0.9 %  sodium chloride infusion (has no  administration in time range)  acetaminophen (TYLENOL) tablet 650 mg (has no administration in time range)  ondansetron (ZOFRAN) injection 4 mg (has no administration in time range)  famotidine (PEPCID) IVPB 20 mg premix (has no administration in time range)  enoxaparin (LOVENOX) injection 40 mg (has no administration in time range)  sodium chloride 0.9 % bolus 1,000 mL (1,000 mLs Intravenous New Bag/Given 06/22/19 0903)  HYDROmorphone (DILAUDID) injection 1 mg (1 mg Intravenous Given 06/22/19 0909)  EPINEPHrine (ADRENALIN) 1 MG/10ML injection (1 mg Intravenous Given 06/22/2019 0837)  amiodarone (CORDARONE) injection (150 mg Intravenous Given June 22, 2019 0843)  calcium chloride injection (1 g Intravenous Given 2019-06-22 0833)  sodium bicarbonate  injection (100 mEq Intravenous Given Jun 22, 2019 0834)  rocuronium (ZEMURON) injection (80 mg Intravenous Given 06-22-19 0837)  sodium chloride 0.9 % bolus 1,000 mL (1,000 mLs Intravenous New Bag/Given June 22, 2019 1030)    Mobility walks     Focused Assessments Pulmonary Assessment Handoff:  O2 Device: Ventilator        R Recommendations: See Admitting Provider Note  Report given to:   Additional Notes:

## 2019-07-10 NOTE — Progress Notes (Signed)
Chaplain offered spiritual presence as family provided a virtual call with a daughter Mendel Ryder) out of state and Cheri Rous (son), brother, and another family member. Family dynamics are complicated due to estrangements and illness. Chase, son, is POA and actively guiding family through rapid decline. Husband is at home due to the past three days of "Missy" decline and taking care of her. He is being updated regularly by [facility]. Family is grieving together and continue to work together to move forward together.     Jun 26, 2019 1800  Clinical Encounter Type  Visited With Patient and family together  Visit Type Critical Care  Spiritual Encounters  Spiritual Needs Grief support;Emotional;Prayer  Stress Factors  Patient Stress Factors Health changes;Loss;Major life changes  Family Stress Factors Family relationships;Health changes;Loss

## 2019-07-10 NOTE — Progress Notes (Signed)
Pt extubated to n/c 4 lpm

## 2019-07-10 DEATH — deceased

## 2019-07-17 ENCOUNTER — Ambulatory Visit: Payer: Medicare Other | Admitting: Internal Medicine

## 2020-08-19 IMAGING — US US EXTREM LOW VENOUS*L*
1 series · 13 of 24 positions shown · non-contrast
Comparison: None.

CLINICAL DATA: Left lower extremity pain and edema.



[Series 1: us extrem low venous*left* · 13 of 36 slices shown]
[im 1/36]
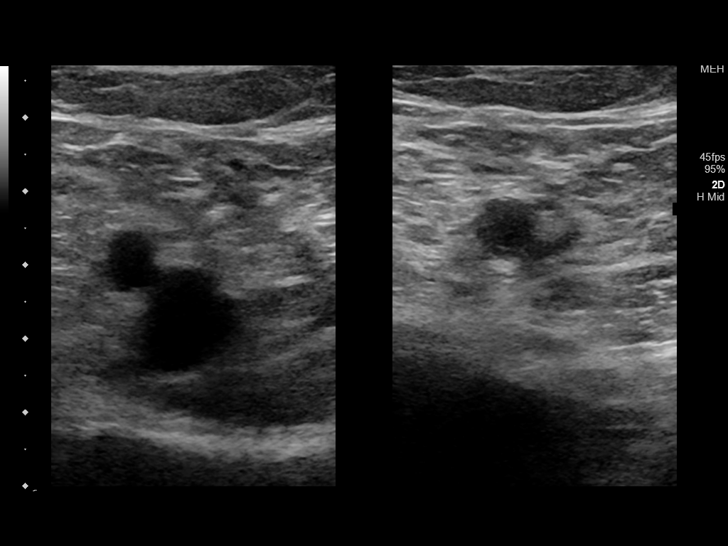
[im 4/36]
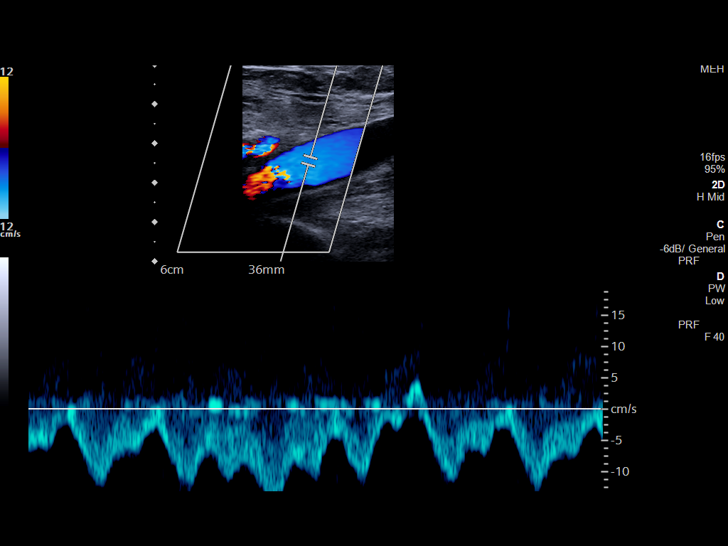
[im 7/36]
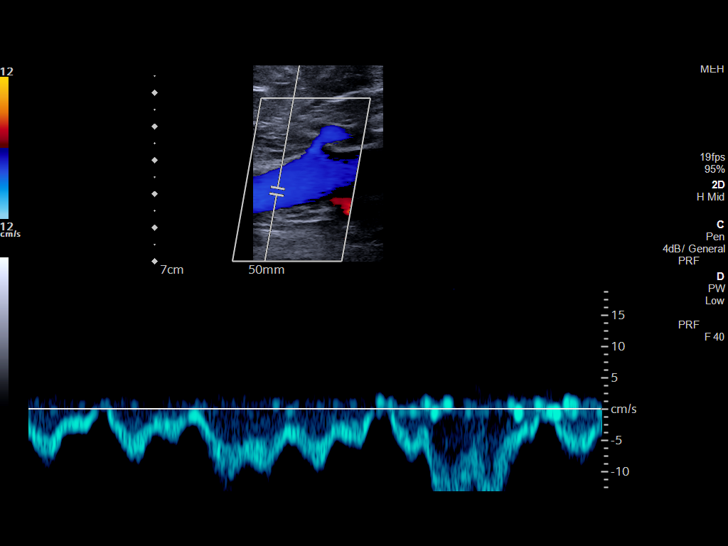
[im 10/36]
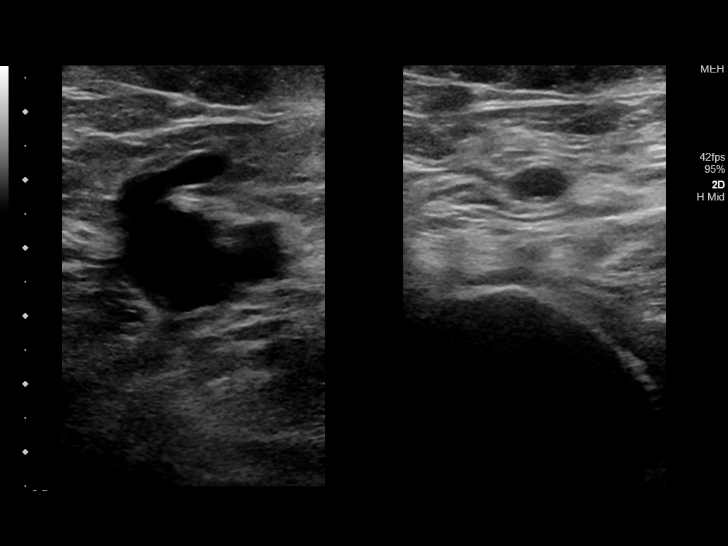
[im 13/36]
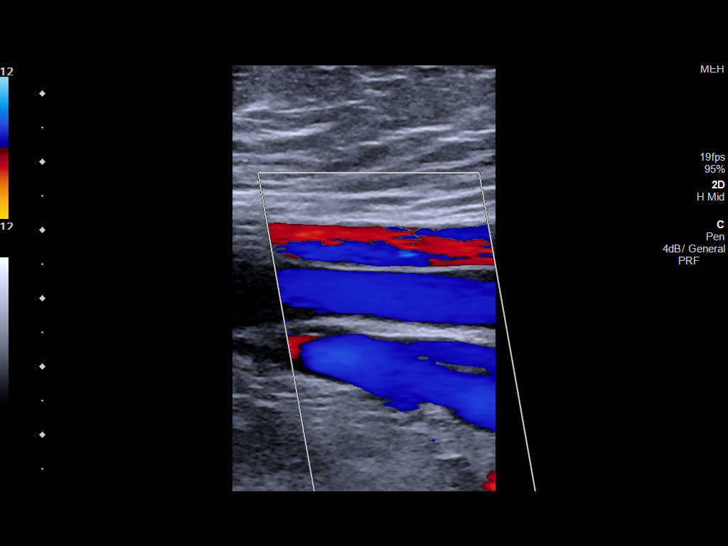
[im 16/36]
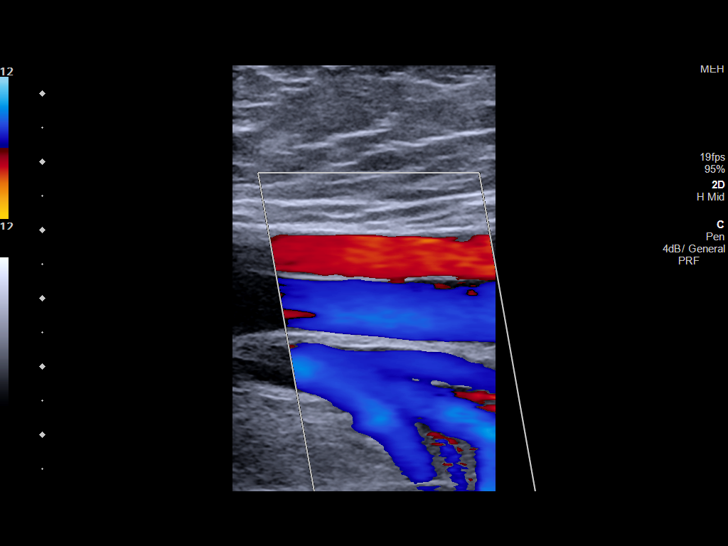
[im 19/36]
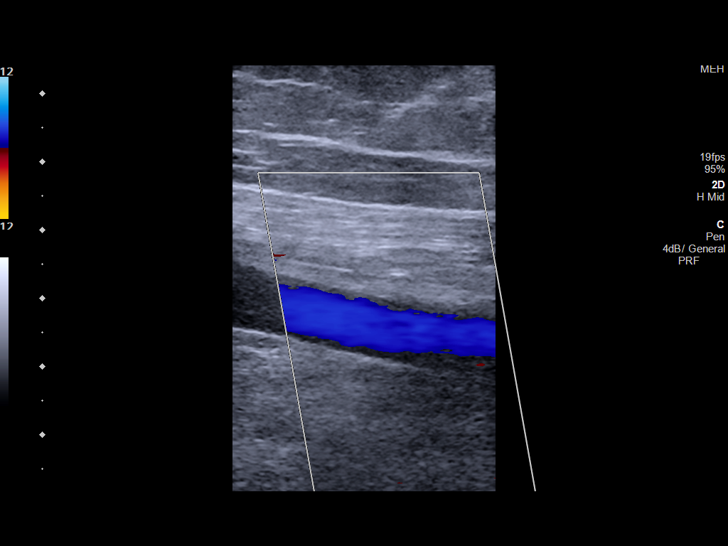
[im 20/36]
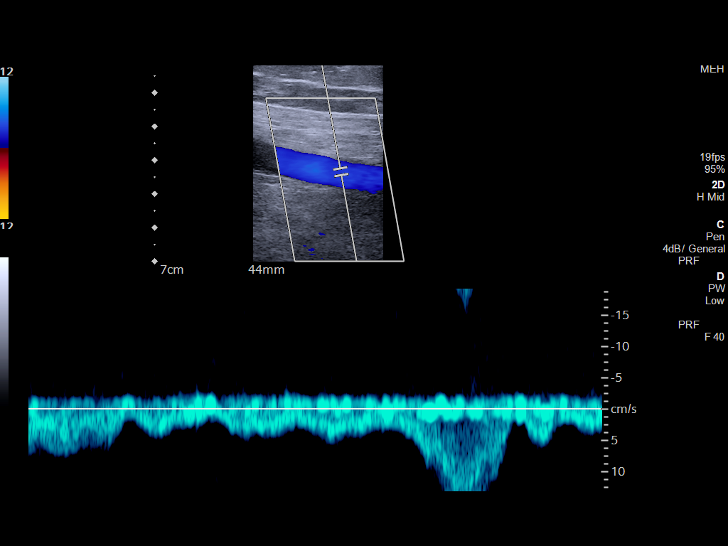
[im 23/36]
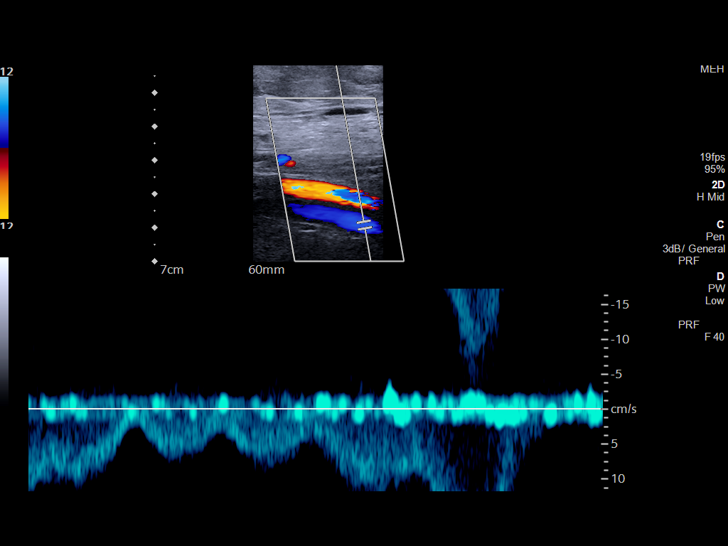
[im 26/36]
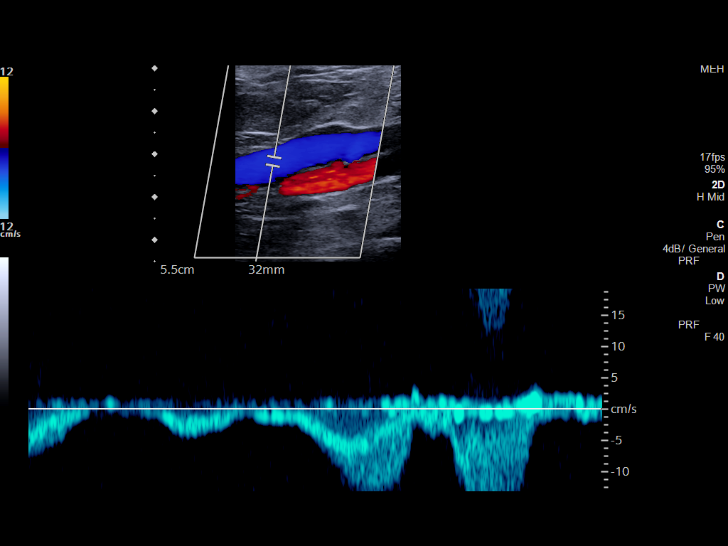
[im 29/36]
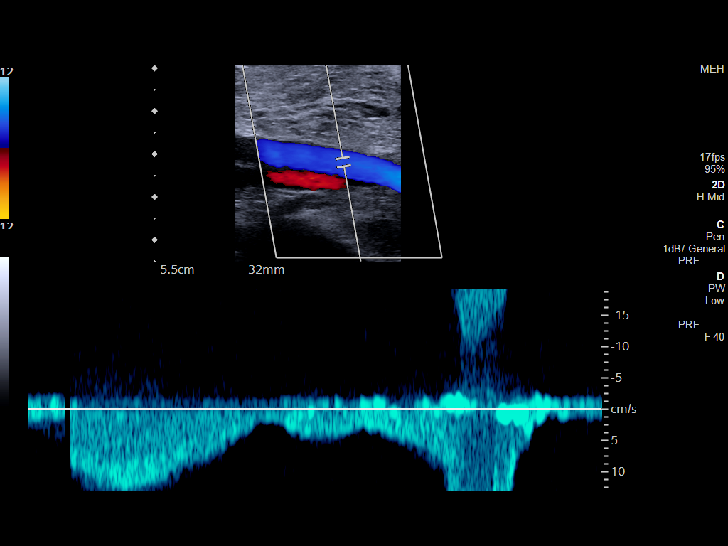
[im 32/36]
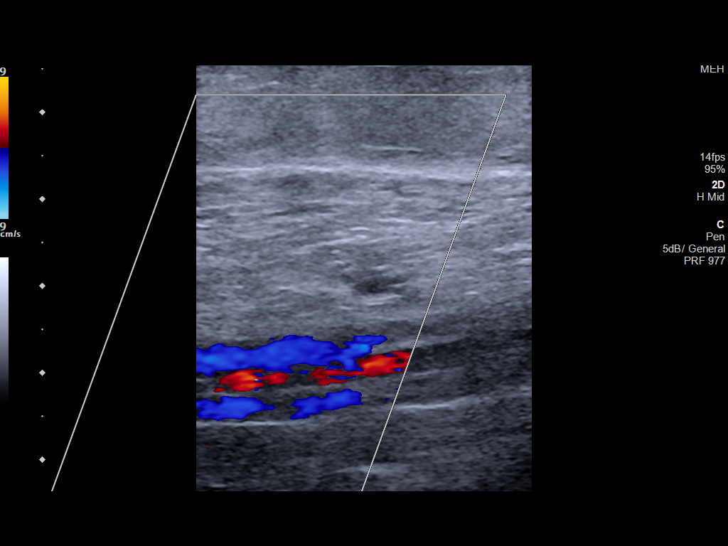
[im 36/36]
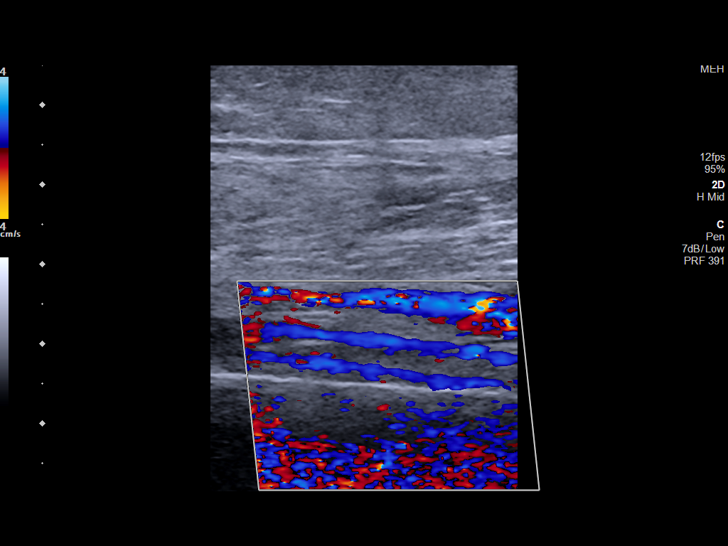

[13 of 24 positions shown; findings below may reference images not displayed]

FINDINGS: Contralateral Common Femoral Vein: Respiratory phasicity is normal
and symmetric with the symptomatic side. No evidence of thrombus.
Normal compressibility.

Common Femoral Vein: No evidence of thrombus. Normal
compressibility, respiratory phasicity and response to augmentation.

Saphenofemoral Junction: No evidence of thrombus. Normal
compressibility and flow on color Doppler imaging.

Profunda Femoral Vein: No evidence of thrombus. Normal
compressibility and flow on color Doppler imaging.

Femoral Vein: No evidence of thrombus. Normal compressibility,
respiratory phasicity and response to augmentation.

Popliteal Vein: No evidence of thrombus. Normal compressibility,
respiratory phasicity and response to augmentation.

Calf Veins: No evidence of thrombus. Normal compressibility and flow
on color Doppler imaging.

Superficial Great Saphenous Vein: No evidence of thrombus. Normal
compressibility.

Venous Reflux:  None.

Other Findings: No evidence of superficial thrombophlebitis or
abnormal fluid collection.
IMPRESSION: No evidence of left lower extremity deep venous thrombosis.

## 2020-09-23 IMAGING — US US EXTREM LOW VENOUS
1 series · 13 of 24 positions shown · non-contrast
Comparison: Left lower extremity ultrasound 03/02/2019

CLINICAL DATA: Swelling and erythema



[Series 1: us extrem low venous · 13 of 63 slices shown]
[im 1/63]
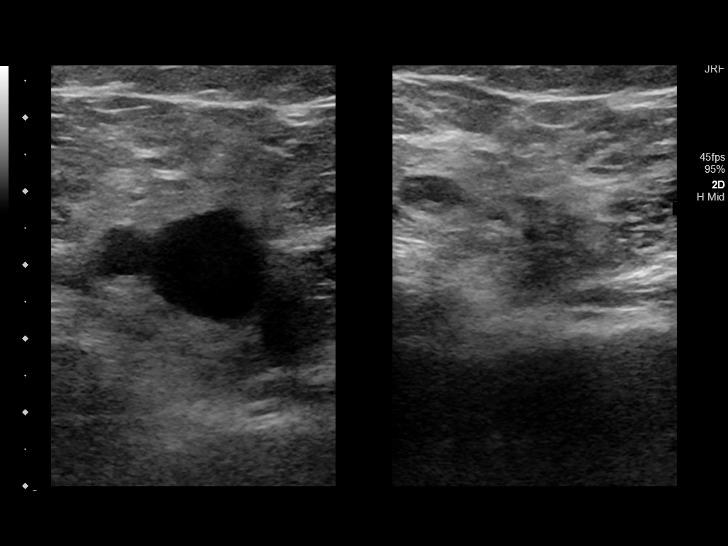
[im 6/63]
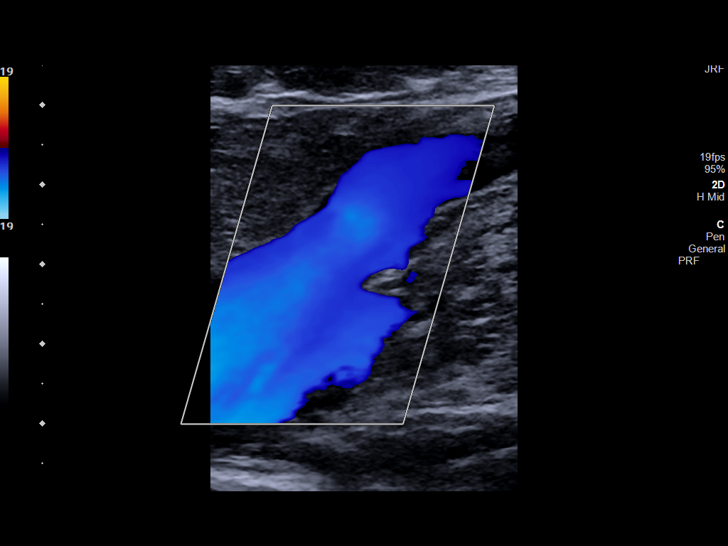
[im 11/63]
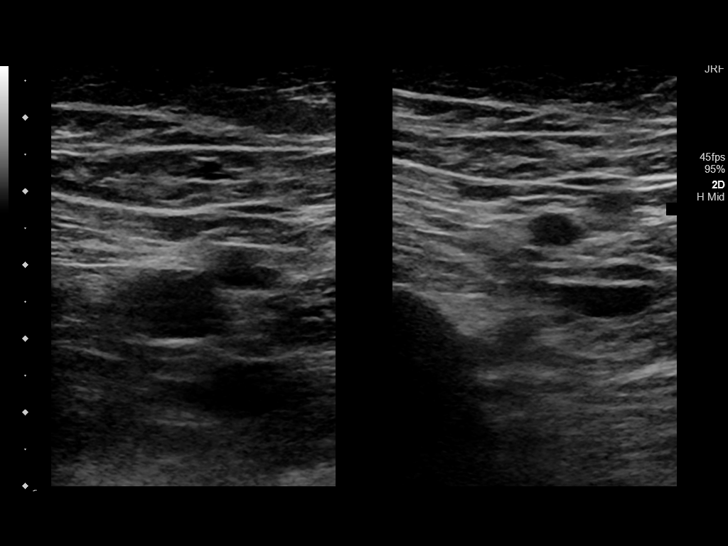
[im 17/63]
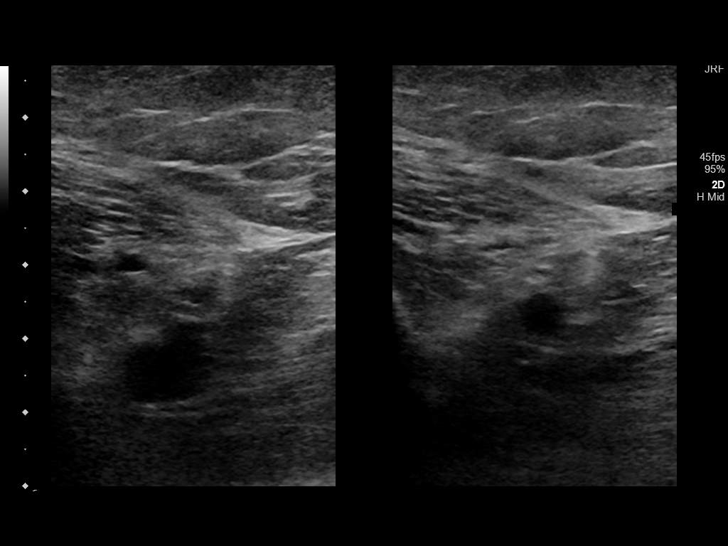
[im 22/63]
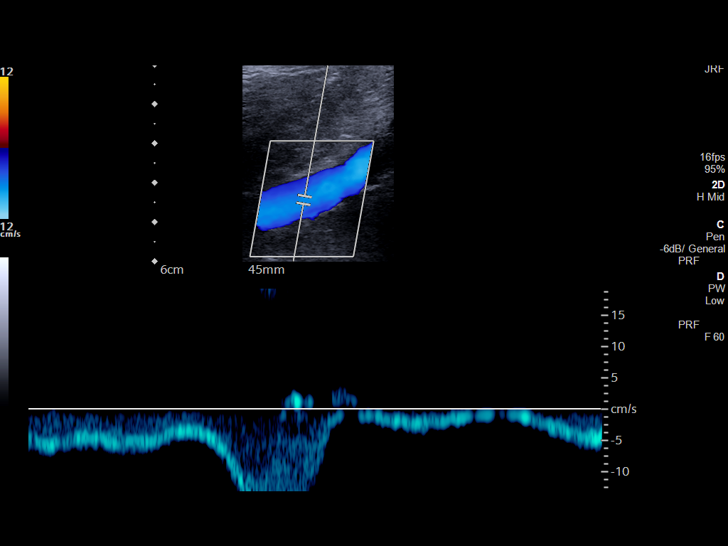
[im 27/63]
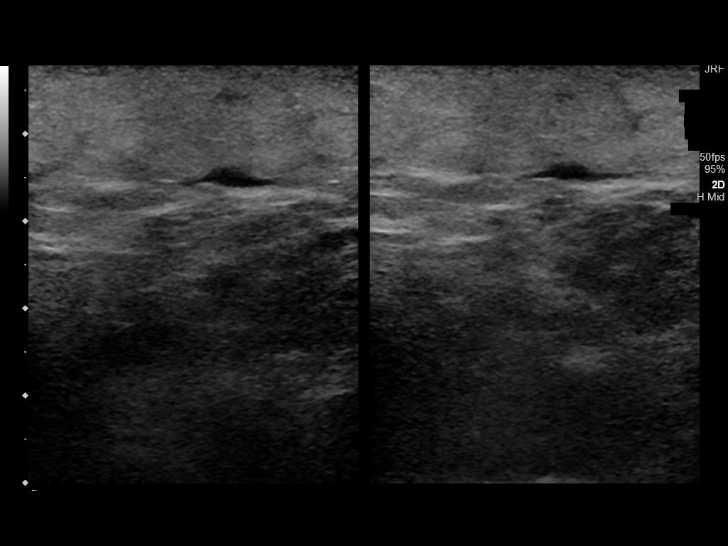
[im 33/63]
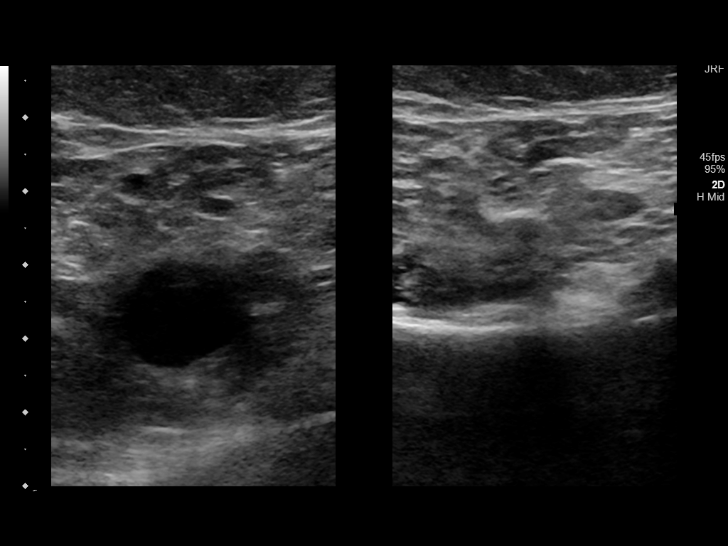
[im 36/63]
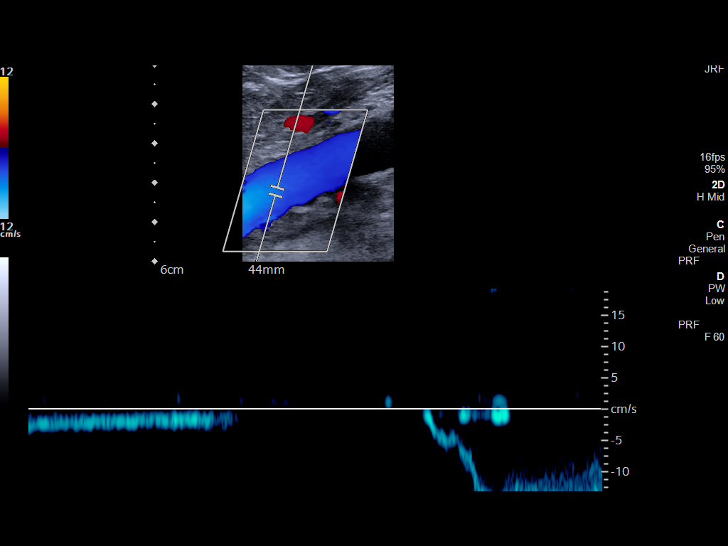
[im 41/63]
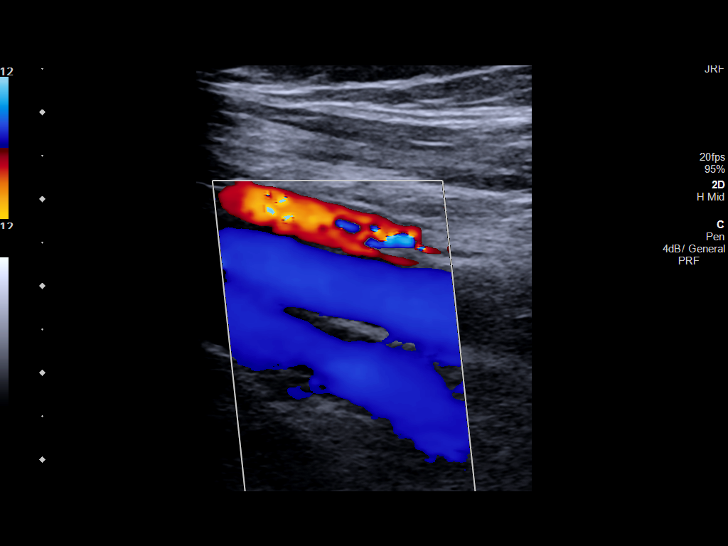
[im 46/63]
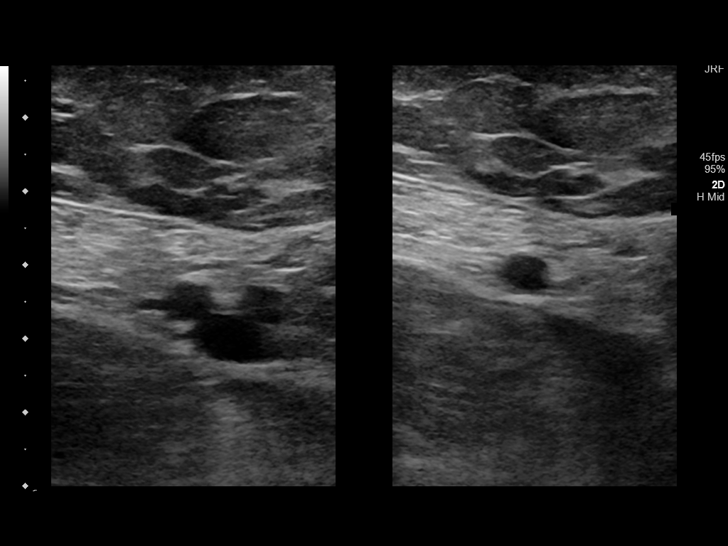
[im 52/63]
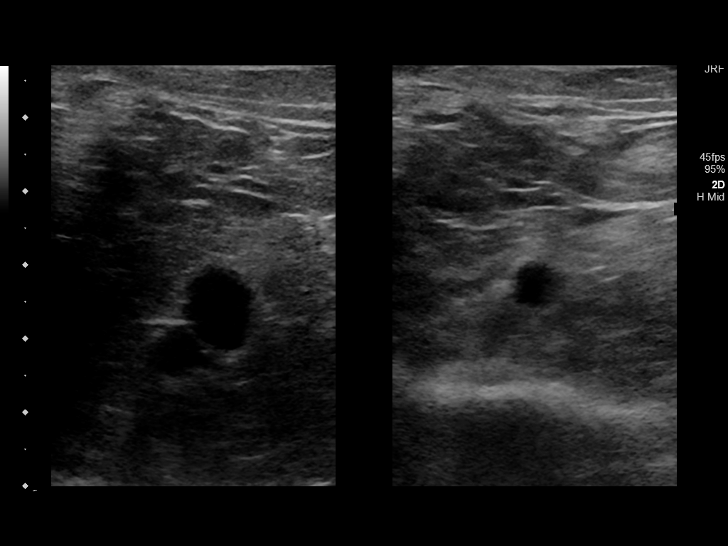
[im 57/63]
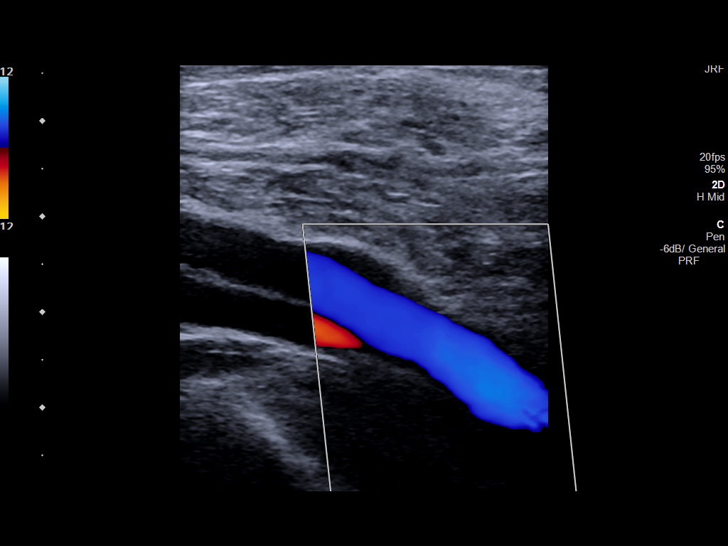
[im 63/63]
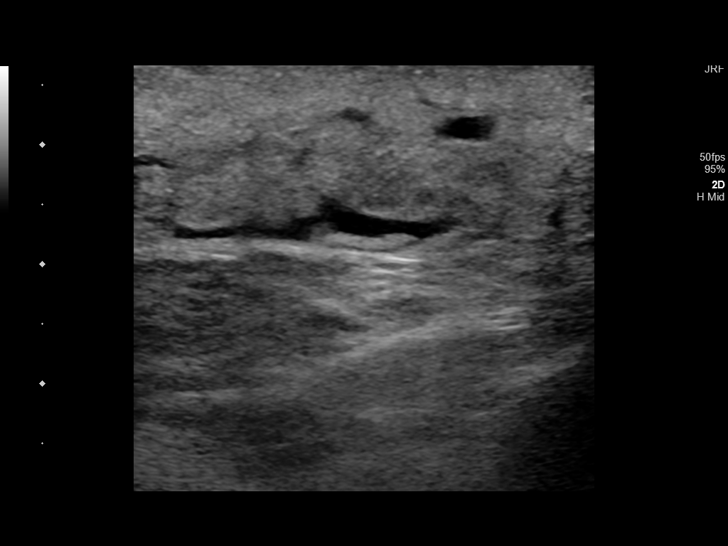

[13 of 24 positions shown; findings below may reference images not displayed]

FINDINGS: RIGHT LOWER EXTREMITY

Common Femoral Vein: No evidence of thrombus. Normal
compressibility, respiratory phasicity and response to augmentation.

Saphenofemoral Junction: No evidence of thrombus. Normal
compressibility and flow on color Doppler imaging.

Profunda Femoral Vein: No evidence of thrombus. Normal
compressibility and flow on color Doppler imaging.

Femoral Vein: No evidence of thrombus. Normal compressibility,
respiratory phasicity and response to augmentation.

Popliteal Vein: No evidence of thrombus. Normal compressibility,
respiratory phasicity and response to augmentation.

Calf Veins: Poor visualization of the peroneal vein. Posterior
tibial vein without evidence of thrombus. Normal compressibility and
flow on color Doppler imaging.

Superficial Great Saphenous Vein: No evidence of thrombus. Normal
compressibility.

Venous Reflux:  None.

Other Findings:  None.

LEFT LOWER EXTREMITY

Common Femoral Vein: No evidence of thrombus. Normal
compressibility, respiratory phasicity and response to augmentation.

Saphenofemoral Junction: No evidence of thrombus. Normal
compressibility and flow on color Doppler imaging.

Profunda Femoral Vein: No evidence of thrombus. Normal
compressibility and flow on color Doppler imaging.

Femoral Vein: No evidence of thrombus. Normal compressibility,
respiratory phasicity and response to augmentation.

Popliteal Vein: No evidence of thrombus. Normal compressibility,
respiratory phasicity and response to augmentation.

Calf Veins: Poor visualization of the peroneal vein. Posterior
tibial vein without evidence of thrombus. Normal compressibility and
flow on color Doppler imaging.

Superficial Great Saphenous Vein: No evidence of thrombus. Normal
compressibility.

Venous Reflux:  None.

Other Findings:  Extensive bilateral lower extremity edema.
IMPRESSION: Extensive bilateral lower extremity edema, limiting visualization of
the peroneal veins bilaterally.

No evidence of deep venous thrombosis in either lower extremity.
